# Patient Record
Sex: Female | Born: 1935 | Race: Black or African American | Hispanic: No | Smoking: Former smoker
Health system: Southern US, Community
[De-identification: ages and names within clinical notes are randomized; demographics above are authoritative.]

## PROBLEM LIST (undated history)

## (undated) DIAGNOSIS — E785 Hyperlipidemia, unspecified: Secondary | ICD-10-CM

## (undated) DIAGNOSIS — I517 Cardiomegaly: Secondary | ICD-10-CM

## (undated) DIAGNOSIS — Z972 Presence of dental prosthetic device (complete) (partial): Secondary | ICD-10-CM

## (undated) DIAGNOSIS — H409 Unspecified glaucoma: Secondary | ICD-10-CM

## (undated) DIAGNOSIS — M1990 Unspecified osteoarthritis, unspecified site: Secondary | ICD-10-CM

## (undated) DIAGNOSIS — N183 Chronic kidney disease, stage 3 unspecified: Secondary | ICD-10-CM

## (undated) DIAGNOSIS — I10 Essential (primary) hypertension: Secondary | ICD-10-CM

## (undated) DIAGNOSIS — I2510 Atherosclerotic heart disease of native coronary artery without angina pectoris: Secondary | ICD-10-CM

## (undated) HISTORY — PX: ABDOMINAL HYSTERECTOMY: SHX81

## (undated) HISTORY — PX: CORONARY ARTERY BYPASS GRAFT: SHX141

## (undated) HISTORY — PX: CATARACT EXTRACTION W/ INTRAOCULAR LENS  IMPLANT, BILATERAL: SHX1307

---

## 2005-11-09 ENCOUNTER — Ambulatory Visit: Payer: Self-pay | Admitting: Specialist

## 2006-06-08 ENCOUNTER — Ambulatory Visit: Payer: Self-pay | Admitting: Family Medicine

## 2011-10-26 ENCOUNTER — Ambulatory Visit: Payer: Self-pay | Admitting: Ophthalmology

## 2011-12-07 ENCOUNTER — Ambulatory Visit: Payer: Self-pay | Admitting: Ophthalmology

## 2013-03-21 ENCOUNTER — Ambulatory Visit: Payer: Self-pay | Admitting: Family Medicine

## 2015-01-09 DIAGNOSIS — I10 Essential (primary) hypertension: Secondary | ICD-10-CM | POA: Insufficient documentation

## 2015-01-27 DIAGNOSIS — N183 Chronic kidney disease, stage 3 unspecified: Secondary | ICD-10-CM | POA: Insufficient documentation

## 2015-01-27 DIAGNOSIS — I119 Hypertensive heart disease without heart failure: Secondary | ICD-10-CM | POA: Insufficient documentation

## 2017-03-30 DIAGNOSIS — I340 Nonrheumatic mitral (valve) insufficiency: Secondary | ICD-10-CM | POA: Insufficient documentation

## 2018-09-19 ENCOUNTER — Encounter: Payer: Self-pay | Admitting: *Deleted

## 2018-09-19 ENCOUNTER — Other Ambulatory Visit: Payer: Self-pay

## 2018-09-20 NOTE — Discharge Instructions (Signed)
General Anesthesia, Adult, Care After  This sheet gives you information about how to care for yourself after your procedure. Your health care provider may also give you more specific instructions. If you have problems or questions, contact your health care provider.  What can I expect after the procedure?  After the procedure, the following side effects are common:  Pain or discomfort at the IV site.  Nausea.  Vomiting.  Sore throat.  Trouble concentrating.  Feeling cold or chills.  Weak or tired.  Sleepiness and fatigue.  Soreness and body aches. These side effects can affect parts of the body that were not involved in surgery.  Follow these instructions at home:    For at least 24 hours after the procedure:  Have a responsible adult stay with you. It is important to have someone help care for you until you are awake and alert.  Rest as needed.  Do not:  Participate in activities in which you could fall or become injured.  Drive.  Use heavy machinery.  Drink alcohol.  Take sleeping pills or medicines that cause drowsiness.  Make important decisions or sign legal documents.  Take care of children on your own.  Eating and drinking  Follow any instructions from your health care provider about eating or drinking restrictions.  When you feel hungry, start by eating small amounts of foods that are soft and easy to digest (bland), such as toast. Gradually return to your regular diet.  Drink enough fluid to keep your urine pale yellow.  If you vomit, rehydrate by drinking water, juice, or clear broth.  General instructions  If you have sleep apnea, surgery and certain medicines can increase your risk for breathing problems. Follow instructions from your health care provider about wearing your sleep device:  Anytime you are sleeping, including during daytime naps.  While taking prescription pain medicines, sleeping medicines, or medicines that make you drowsy.  Return to your normal activities as told by your health care  provider. Ask your health care provider what activities are safe for you.  Take over-the-counter and prescription medicines only as told by your health care provider.  If you smoke, do not smoke without supervision.  Keep all follow-up visits as told by your health care provider. This is important.  Contact a health care provider if:  You have nausea or vomiting that does not get better with medicine.  You cannot eat or drink without vomiting.  You have pain that does not get better with medicine.  You are unable to pass urine.  You develop a skin rash.  You have a fever.  You have redness around your IV site that gets worse.  Get help right away if:  You have difficulty breathing.  You have chest pain.  You have blood in your urine or stool, or you vomit blood.  Summary  After the procedure, it is common to have a sore throat or nausea. It is also common to feel tired.  Have a responsible adult stay with you for the first 24 hours after general anesthesia. It is important to have someone help care for you until you are awake and alert.  When you feel hungry, start by eating small amounts of foods that are soft and easy to digest (bland), such as toast. Gradually return to your regular diet.  Drink enough fluid to keep your urine pale yellow.  Return to your normal activities as told by your health care provider. Ask your health care   provider what activities are safe for you.  This information is not intended to replace advice given to you by your health care provider. Make sure you discuss any questions you have with your health care provider.  Document Released: 11/14/2000 Document Revised: 03/24/2017 Document Reviewed: 03/24/2017  Elsevier Interactive Patient Education  2019 [employer]

## 2018-09-26 ENCOUNTER — Ambulatory Visit
Admission: RE | Admit: 2018-09-26 | Discharge: 2018-09-26 | Disposition: A | Discharge status: Home / Self Care | Payer: Medicare Other | Attending: Ophthalmology | Admitting: Ophthalmology

## 2018-09-26 ENCOUNTER — Encounter
Admission: RE | Disposition: A | Discharge status: Home / Self Care | Payer: Self-pay | Source: Home / Self Care | Attending: Ophthalmology

## 2018-09-26 ENCOUNTER — Ambulatory Visit: Payer: Medicare Other | Admitting: Anesthesiology

## 2018-09-26 DIAGNOSIS — E7800 Pure hypercholesterolemia, unspecified: Secondary | ICD-10-CM | POA: Insufficient documentation

## 2018-09-26 DIAGNOSIS — D649 Anemia, unspecified: Secondary | ICD-10-CM | POA: Insufficient documentation

## 2018-09-26 DIAGNOSIS — R001 Bradycardia, unspecified: Secondary | ICD-10-CM | POA: Diagnosis not present

## 2018-09-26 DIAGNOSIS — I10 Essential (primary) hypertension: Secondary | ICD-10-CM | POA: Diagnosis not present

## 2018-09-26 DIAGNOSIS — Z951 Presence of aortocoronary bypass graft: Secondary | ICD-10-CM | POA: Diagnosis not present

## 2018-09-26 DIAGNOSIS — H401113 Primary open-angle glaucoma, right eye, severe stage: Secondary | ICD-10-CM | POA: Diagnosis present

## 2018-09-26 DIAGNOSIS — I252 Old myocardial infarction: Secondary | ICD-10-CM | POA: Diagnosis not present

## 2018-09-26 DIAGNOSIS — Z9842 Cataract extraction status, left eye: Secondary | ICD-10-CM | POA: Insufficient documentation

## 2018-09-26 DIAGNOSIS — F17200 Nicotine dependence, unspecified, uncomplicated: Secondary | ICD-10-CM | POA: Diagnosis not present

## 2018-09-26 DIAGNOSIS — Z9841 Cataract extraction status, right eye: Secondary | ICD-10-CM | POA: Insufficient documentation

## 2018-09-26 DIAGNOSIS — M1990 Unspecified osteoarthritis, unspecified site: Secondary | ICD-10-CM | POA: Insufficient documentation

## 2018-09-26 DIAGNOSIS — I2510 Atherosclerotic heart disease of native coronary artery without angina pectoris: Secondary | ICD-10-CM | POA: Diagnosis not present

## 2018-09-26 DIAGNOSIS — F419 Anxiety disorder, unspecified: Secondary | ICD-10-CM | POA: Insufficient documentation

## 2018-09-26 DIAGNOSIS — M109 Gout, unspecified: Secondary | ICD-10-CM | POA: Insufficient documentation

## 2018-09-26 HISTORY — DX: Cardiomegaly: I51.7

## 2018-09-26 HISTORY — DX: Essential (primary) hypertension: I10

## 2018-09-26 HISTORY — DX: Presence of dental prosthetic device (complete) (partial): Z97.2

## 2018-09-26 HISTORY — DX: Chronic kidney disease, stage 3 (moderate): N18.3

## 2018-09-26 HISTORY — DX: Chronic kidney disease, stage 3 unspecified: N18.30

## 2018-09-26 HISTORY — DX: Atherosclerotic heart disease of native coronary artery without angina pectoris: I25.10

## 2018-09-26 HISTORY — DX: Unspecified osteoarthritis, unspecified site: M19.90

## 2018-09-26 HISTORY — DX: Unspecified glaucoma: H40.9

## 2018-09-26 HISTORY — DX: Hyperlipidemia, unspecified: E78.5

## 2018-09-26 HISTORY — PX: PHOTOCOAGULATION WITH LASER: SHX6027

## 2018-09-26 SURGERY — PHOTOCOAGULATION, EYE, USING LASER
Anesthesia: Monitor Anesthesia Care | Site: Eye | Laterality: Right

## 2018-09-26 MED ORDER — LIDOCAINE HCL 2 % IJ SOLN
INTRAMUSCULAR | Status: DC | PRN
  Administered 2018-09-26: 3 mL via OPHTHALMIC

## 2018-09-26 MED ORDER — ATROPINE SULFATE 1 % OP OINT
TOPICAL_OINTMENT | OPHTHALMIC | Status: DC | PRN
  Administered 2018-09-26: 1 via OPHTHALMIC

## 2018-09-26 MED ORDER — NEOMYCIN-POLYMYXIN-DEXAMETH 3.5-10000-0.1 OP OINT
TOPICAL_OINTMENT | OPHTHALMIC | Status: DC | PRN
  Administered 2018-09-26: 1 via OPHTHALMIC

## 2018-09-26 MED ORDER — ALFENTANIL 500 MCG/ML IJ INJ
INJECTION | INTRAVENOUS | Status: DC | PRN
  Administered 2018-09-26: 200 ug via INTRAVENOUS
  Administered 2018-09-26: 100 ug via INTRAVENOUS

## 2018-09-26 SURGICAL SUPPLY — 11 items
DEVICE G-PROBE SGL USE (Laser) IMPLANT
DEVICE MICRO PULS P3 SGL USE (Laser) ×1 IMPLANT
G-PROBE SGL USE (Laser) ×3
GAUZE SPONGE 4X4 12PLY STRL (GAUZE/BANDAGES/DRESSINGS) ×3 IMPLANT
NDL FILTER BLUNT 18X1 1/2 (NEEDLE) ×1 IMPLANT
NDL RETROBULBAR .5 NSTRL (NEEDLE) ×3 IMPLANT
NEEDLE FILTER BLUNT 18X 1/2SAF (NEEDLE) ×2
NEEDLE FILTER BLUNT 18X1 1/2 (NEEDLE) ×1 IMPLANT
SYR 5ML LL (SYRINGE) ×3 IMPLANT
WATER STERILE IRR 250ML POUR (IV SOLUTION) ×3 IMPLANT
WATER STERILE IRR 500ML POUR (IV SOLUTION) IMPLANT

## 2018-09-26 NOTE — Transfer of Care (Signed)
Immediate Anesthesia Transfer of Care Note  Patient: Hayley Maynard  Procedure(s) Performed: PHOTOCOAGULATION WITH LASER (Right Eye)  Patient Location: PACU  Anesthesia Type: MAC  Level of Consciousness: awake, alert  and patient cooperative  Airway and Oxygen Therapy: Patient Spontanous Breathing and Patient connected to supplemental oxygen  Post-op Assessment: Post-op Vital signs reviewed, Patient's Cardiovascular Status Stable, Respiratory Function Stable, Patent Airway and No signs of Nausea or vomiting  Post-op Vital Signs: Reviewed and stable  Complications: No apparent anesthesia complications

## 2018-09-26 NOTE — Anesthesia Procedure Notes (Signed)
Procedure Name: MAC Performed by: Enrica Corliss, CRNA Pre-anesthesia Checklist: Patient identified, Emergency Drugs available, Suction available, Timeout performed and Patient being monitored Patient Re-evaluated:Patient Re-evaluated prior to induction Oxygen Delivery Method: Nasal cannula Placement Confirmation: positive ETCO2       

## 2018-09-26 NOTE — H&P (Signed)
The History and Physical notes are on paper, have been signed, and are to be scanned. The patient remains stable and unchanged from the H&P.   Previous H&P reviewed, patient examined, and there are no changes.  Hayley Maynard 09/26/2018 12:26 PM

## 2018-09-26 NOTE — Anesthesia Preprocedure Evaluation (Signed)
Anesthesia Evaluation   Patient awake    Reviewed: Allergy & Precautions, H&P , NPO status , Patient's Chart, lab work & pertinent test results  History of Anesthesia Complications Negative for: history of anesthetic complications  Airway Mallampati: II  TM Distance: >3 FB Neck ROM: full  Mouth opening: Limited Mouth Opening  Dental  (+) Edentulous Upper, Edentulous Lower   Pulmonary Current Smoker,    Pulmonary exam normal breath sounds clear to auscultation       Cardiovascular hypertension, + CAD  Normal cardiovascular exam Rhythm:regular Rate:Normal     Neuro/Psych negative neurological ROS     GI/Hepatic negative GI ROS, Neg liver ROS,   Endo/Other  negative endocrine ROS  Renal/GU   negative genitourinary   Musculoskeletal   Abdominal   Peds  Hematology negative hematology ROS (+)   Anesthesia Other Findings   Reproductive/Obstetrics                             Anesthesia Physical Anesthesia Plan  ASA: III  Anesthesia Plan: MAC   Post-op Pain Management:    Induction:   PONV Risk Score and Plan:   Airway Management Planned:   Additional Equipment:   Intra-op Plan:   Post-operative Plan:   Informed Consent: I have reviewed the patients History and Physical, chart, labs and discussed the procedure including the risks, benefits and alternatives for the proposed anesthesia with the patient or authorized representative who has indicated his/her understanding and acceptance.       Plan Discussed with:   Anesthesia Plan Comments:         Anesthesia Quick Evaluation

## 2018-09-26 NOTE — Anesthesia Postprocedure Evaluation (Signed)
Anesthesia Post Note  Patient: Hayley Maynard  Procedure(s) Performed: PHOTOCOAGULATION WITH LASER (Right Eye)  Patient location during evaluation: PACU Anesthesia Type: MAC Level of consciousness: awake and alert Pain management: pain level controlled Respiratory status: spontaneous breathing Cardiovascular status: stable Anesthetic complications: no    Kishon Garriga, III,  Mayre Bury D

## 2018-09-26 NOTE — Op Note (Signed)
DATE OF SURGERY: 09/26/2018  PREOPERATIVE DIAGNOSES: Severe stage primary open angle glaucoma, right eye.      H40.1113  POSTOPERATIVE DIAGNOSES: Same  PROCEDURES PERFORMED: Transscleral diode cyclophotocoagulation, right eye  SURGEON: Chad Arvilla Salada, M.D.  ANESTHESIA: Retrobulbar block of Xylocaine and Bupivacaine and Hyaluronidase  COMPLICATIONS: None.  INDICATIONS FOR PROCEDURE: Hayley Maynard is a 85 y.o. year-old female with uncontrolled primary open angle glaucoma. The risks and benefits of glaucoma surgery were discussed with the patient, and she consented for a diode laser surgery.  PROCEDURE IN DETAIL: The eye for surgery was verified during the time-out procedure in the operating room. A retrobulbar block of lidocaine, Marcaine, and hyaluronidase was done for an[place]0.1015Melene MuKalispell RegionGGerlene Gerlene Burdo21cMozMountain Valley Regional KentuckGerlene Burdo41cMozAstrGerlene Burdo2cMKentucky0.105GerleneGerlene Burdo42cMozGailey Eye SurgeGerlene Burdo34cMKenGerlene Burdo87cMGerlene Burdo11KentuGerlene BurGerlene Burdo72cMozMid-HudsonGerlene Burdo33cMozNew HorizonsGerlene Burdo98cMozSurgGerlene Burdo65cMozUmass MemoKentuckGerlene Burdo39cMoz38EMozambTeacher, e<MEASUREGerlene Burdo16cMozOscar G. JohKGerlene BuKentuckyGerlene Burdo36cMozFeliciana-Amg Specialty Hospi46tMarjory LiTeacherKentucky, [id number] years/preurgicare [facility] [id number] > The eye was pressure patched closed. The patient tolerated the procedure well and was transferred to the Post-operative Care Unit in stable condition.

## 2018-11-08 DIAGNOSIS — D649 Anemia, unspecified: Secondary | ICD-10-CM | POA: Insufficient documentation

## 2018-11-08 DIAGNOSIS — I2510 Atherosclerotic heart disease of native coronary artery without angina pectoris: Secondary | ICD-10-CM | POA: Insufficient documentation

## 2018-11-08 DIAGNOSIS — E782 Mixed hyperlipidemia: Secondary | ICD-10-CM | POA: Insufficient documentation

## 2018-11-09 ENCOUNTER — Encounter: Payer: Self-pay | Admitting: Podiatry

## 2018-11-09 ENCOUNTER — Ambulatory Visit (INDEPENDENT_AMBULATORY_CARE_PROVIDER_SITE_OTHER): Payer: Medicare Other | Admitting: Podiatry

## 2018-11-09 DIAGNOSIS — M79676 Pain in unspecified toe(s): Secondary | ICD-10-CM

## 2018-11-09 DIAGNOSIS — B351 Tinea unguium: Secondary | ICD-10-CM | POA: Diagnosis not present

## 2018-11-13 NOTE — Progress Notes (Signed)
   SUBJECTIVE Patient presents to office today complaining of elongated, thickened nails that cause pain while ambulating in shoes. She is unable to trim her own nails. Patient is here for further evaluation and treatment.  Past Medical History:  Diagnosis Date  . Arthritis    feet  . CKD (chronic kidney disease), stage III (HCC)   . Coronary artery disease   . Glaucoma   . Hyperlipidemia   . Hypertension   . LVH (left ventricular hypertrophy)   . Wears dentures    full upper and lower    OBJECTIVE General Patient is awake, alert, and oriented x 3 and in no acute distress. Derm Skin is dry and supple bilateral. Negative open lesions or macerations. Remaining integument unremarkable. Nails are tender, long, thickened and dystrophic with subungual debris, consistent with onychomycosis, 1-5 bilateral. No signs of infection noted. Vasc  DP and PT pedal pulses palpable bilaterally. Temperature gradient within normal limits.  Neuro Epicritic and protective threshold sensation grossly intact bilaterally.  Musculoskeletal Exam No symptomatic pedal deformities noted bilateral. Muscular strength within normal limits.  ASSESSMENT 1. Onychodystrophic nails 1-5 bilateral with hyperkeratosis of nails.  2. Onychomycosis of nail due to dermatophyte bilateral 3. Pain in foot bilateral  PLAN OF CARE 1. Patient evaluated today.  2. Instructed to maintain good pedal hygiene and foot care.  3. Mechanical debridement of nails 1-5 bilaterally performed using a nail nipper. Filed with dremel without incident.  4. Return to clinic in 3 mos.    Felecia Shelling, DPM [facility]  Dr. Felecia Shelling, DPM    [address]                                        [place], [place] [address]                Office [phone]  Fax [phone]

## 2019-02-12 ENCOUNTER — Encounter: Payer: Self-pay | Admitting: Podiatry

## 2019-02-12 ENCOUNTER — Other Ambulatory Visit: Payer: Self-pay

## 2019-02-12 ENCOUNTER — Ambulatory Visit (INDEPENDENT_AMBULATORY_CARE_PROVIDER_SITE_OTHER): Payer: Medicare Other | Admitting: Podiatry

## 2019-02-12 DIAGNOSIS — L989 Disorder of the skin and subcutaneous tissue, unspecified: Secondary | ICD-10-CM

## 2019-02-12 DIAGNOSIS — B351 Tinea unguium: Secondary | ICD-10-CM | POA: Diagnosis not present

## 2019-02-12 DIAGNOSIS — M79676 Pain in unspecified toe(s): Secondary | ICD-10-CM

## 2019-02-14 NOTE — Progress Notes (Signed)
    Subjective: Patient is a 85 y.o. female presenting to the office today with a chief complaint of painful callus lesions noted to the bilateral feet that have become increasingly more painful in the past few weeks. Bearing weight and ambulation increases the pain. She has not done anything at home for treatment.  Patient also complains of elongated, thickened nails that cause pain while ambulating in shoes. She is unable to trim her own nails. Patient presents today for further treatment and evaluation.  Past Medical History:  Diagnosis Date  . Arthritis    feet  . CKD (chronic kidney disease), stage III ([place])   . Coronary artery disease   . Glaucoma   . Hyperlipidemia   . Hypertension   . LVH (left ventricular hypertrophy)   . Wears dentures    full upper and lower    Objective:  Physical Exam General: Alert and oriented x3 in no acute distress  Dermatology: Hyperkeratotic lesions present on the bilateral feet. Pain on palpation with a central nucleated core noted. Skin is warm, dry and supple bilateral lower extremities. Negative for open lesions or macerations. Nails are tender, long, thickened and dystrophic with subungual debris, consistent with onychomycosis, 1-5 bilateral. No signs of infection noted.  Vascular: Palpable pedal pulses bilaterally. No edema or erythema noted. Capillary refill within normal limits.  Neurological: Epicritic and protective threshold grossly intact bilaterally.   Musculoskeletal Exam: Pain on palpation at the keratotic lesion noted. Range of motion within normal limits bilateral. Muscle strength 5/5 in all groups bilateral.  Assessment: 1. Onychodystrophic nails 1-5 bilateral with hyperkeratosis of nails.  2. Onychomycosis of nail due to dermatophyte bilateral 3. Pre-ulcerative callus lesions noted to the bilateral feet x 2   Plan of Care:  1. Patient evaluated. 2. Excisional debridement of keratoic lesion using a chisel blade was  performed without incident.  3. Dressed with light dressing. 4. Mechanical debridement of nails 1-5 bilaterally performed using a nail nipper. Filed with dremel without incident.  5. Patient is to return to the clinic in 3 months.   Edrick Kins, DPM [facility]  Dr. Edrick Kins, [address]                                        [place], [place] [address]                Office [phone]  Fax [phone]

## 2019-05-13 ENCOUNTER — Encounter: Payer: Self-pay | Admitting: Podiatry

## 2019-05-13 ENCOUNTER — Other Ambulatory Visit: Payer: Self-pay

## 2019-05-13 ENCOUNTER — Ambulatory Visit (INDEPENDENT_AMBULATORY_CARE_PROVIDER_SITE_OTHER): Payer: Medicare Other | Admitting: Podiatry

## 2019-05-13 DIAGNOSIS — M79676 Pain in unspecified toe(s): Secondary | ICD-10-CM | POA: Diagnosis not present

## 2019-05-13 DIAGNOSIS — B351 Tinea unguium: Secondary | ICD-10-CM

## 2019-05-13 NOTE — Progress Notes (Signed)
Complaint:  Visit Type: Patient returns to my office for continued preventative foot care services. Complaint: Patient states" my nails have grown long and thick and become painful to walk and wear shoes"  The patient presents for preventative foot care services. No changes to ROS  Podiatric Exam: Vascular: dorsalis pedis and posterior tibial pulses are palpable bilateral. Capillary return is immediate. Cold feet.. Skin turgor WNL  Sensorium: Normal Semmes Weinstein monofilament test. Normal tactile sensation bilaterally. Nail Exam: Pt has thick disfigured discolored nails with subungual debris noted bilateral entire nail hallux through fifth toenails Ulcer Exam: There is no evidence of ulcer or pre-ulcerative changes or infection. Orthopedic Exam: Muscle tone and strength are WNL. No limitations in general ROM. No crepitus or effusions noted. Foot type and digits show no abnormalities. HAV  B/L.  Contracted digits  B/L. Skin: No Porokeratosis. No infection or ulcers  Diagnosis:  Onychomycosis, , Pain in right toe, pain in left toes  Treatment & Plan Procedures and Treatment: Consent by patient was obtained for treatment procedures.   Debridement of mycotic and hypertrophic toenails, 1 through 5 bilateral and clearing of subungual debris. No ulceration, no infection noted.  Return Visit-Office Procedure: Patient instructed to return to the office for a follow up visit 3 months for continued evaluation and treatment.    Gardiner Barefoot DPM

## 2019-07-25 ENCOUNTER — Other Ambulatory Visit: Payer: Self-pay | Admitting: Family Medicine

## 2019-07-25 DIAGNOSIS — R0989 Other specified symptoms and signs involving the circulatory and respiratory systems: Secondary | ICD-10-CM

## 2019-08-02 ENCOUNTER — Other Ambulatory Visit: Payer: Self-pay

## 2019-08-02 ENCOUNTER — Ambulatory Visit
Admission: RE | Admit: 2019-08-02 | Discharge: 2019-08-02 | Disposition: A | Discharge status: Home / Self Care | Payer: Medicare Other | Source: Ambulatory Visit | Attending: Family Medicine | Admitting: Family Medicine

## 2019-08-02 DIAGNOSIS — R0989 Other specified symptoms and signs involving the circulatory and respiratory systems: Secondary | ICD-10-CM | POA: Diagnosis present

## 2019-08-08 ENCOUNTER — Other Ambulatory Visit: Payer: Self-pay

## 2019-08-08 ENCOUNTER — Encounter: Payer: Self-pay | Admitting: Podiatry

## 2019-08-08 ENCOUNTER — Ambulatory Visit (INDEPENDENT_AMBULATORY_CARE_PROVIDER_SITE_OTHER): Payer: Medicare Other | Admitting: Podiatry

## 2019-08-08 DIAGNOSIS — M79676 Pain in unspecified toe(s): Secondary | ICD-10-CM | POA: Diagnosis not present

## 2019-08-08 DIAGNOSIS — B351 Tinea unguium: Secondary | ICD-10-CM | POA: Diagnosis not present

## 2019-08-08 NOTE — Progress Notes (Signed)
Complaint:  Visit Type: Patient returns to my office for continued preventative foot care services. Complaint: Patient states" my nails have grown long and thick and become painful to walk and wear shoes"  The patient presents for preventative foot care services. No changes to ROS  Podiatric Exam: Vascular: dorsalis pedis and posterior tibial pulses are palpable bilateral. Capillary return is immediate. Cold feet.. Skin turgor WNL  Sensorium: Normal Semmes Weinstein monofilament test. Normal tactile sensation bilaterally. Nail Exam: Pt has thick disfigured discolored nails with subungual debris noted bilateral entire nail hallux through fifth toenails Ulcer Exam: There is no evidence of ulcer or pre-ulcerative changes or infection. Orthopedic Exam: Muscle tone and strength are WNL. No limitations in general ROM. No crepitus or effusions noted. Foot type and digits show no abnormalities. HAV  B/L.  Contracted digits  B/L. Skin: No Porokeratosis. No infection or ulcers  Diagnosis:  Onychomycosis, , Pain in right toe, pain in left toes  Treatment & Plan Procedures and Treatment: Consent by patient was obtained for treatment procedures.   Debridement of mycotic and hypertrophic toenails, 1 through 5 bilateral and clearing of subungual debris. No ulceration, no infection noted. Excoriation third toe left.  No bleeding. Return Visit-Office Procedure: Patient instructed to return to the office for a follow up visit 3 months for continued evaluation and treatment.    Gardiner Barefoot DPM

## 2019-10-28 ENCOUNTER — Other Ambulatory Visit: Payer: Self-pay

## 2019-10-28 ENCOUNTER — Ambulatory Visit (INDEPENDENT_AMBULATORY_CARE_PROVIDER_SITE_OTHER): Payer: Medicare Other | Admitting: Podiatry

## 2019-10-28 ENCOUNTER — Encounter: Payer: Self-pay | Admitting: Podiatry

## 2019-10-28 DIAGNOSIS — B351 Tinea unguium: Secondary | ICD-10-CM | POA: Diagnosis not present

## 2019-10-28 DIAGNOSIS — M79676 Pain in unspecified toe(s): Secondary | ICD-10-CM

## 2019-10-28 DIAGNOSIS — N183 Chronic kidney disease, stage 3 unspecified: Secondary | ICD-10-CM | POA: Insufficient documentation

## 2019-10-28 NOTE — Progress Notes (Signed)
Complaint:  Visit Type: Patient returns to my office for at risk foot care.  This patient requires this care by a professional since this patient will be at risk due to having chronic kidney disease stage III .  This patient is unable to cut her own toenails since she cannot reach her nails.  This patient presents for risk foot care today.   Podiatric Exam: Vascular: dorsalis pedis and posterior tibial pulses are palpable right foot.  Dorsalis and posterior tibial pulses are not palpable  B/L. Capillary return is immediate. Cold feet.. Thin shiny peeling skin  B/L Sensorium: Normal Semmes Weinstein monofilament test. Normal tactile sensation bilaterally. Nail Exam: Pt has thick disfigured discolored nails with subungual debris noted bilateral entire nail hallux through fifth toenails Ulcer Exam: There is no evidence of ulcer or pre-ulcerative changes or infection. Orthopedic Exam: Muscle tone and strength are WNL. No limitations in general ROM. No crepitus or effusions noted. Foot type and digits show no abnormalities. HAV  B/L.  Contracted digits  B/L. Skin: No Porokeratosis. No infection or ulcers  Diagnosis:  Onychomycosis, , Pain in right toe, pain in left toes  Treatment & Plan Procedures and Treatment: Consent by patient was obtained for treatment procedures.   Debridement of mycotic and hypertrophic toenails, 1 through 5 bilateral and clearing of subungual debris. No ulceration, no infection noted.  Return Visit-Office Procedure: Patient instructed to return to the office for a follow up visit 3 months for continued evaluation and treatment.  Told this patient to return for periodic foot evaluation due to potential at risk complications.      Helane Gunther DPM

## 2019-11-04 ENCOUNTER — Ambulatory Visit: Payer: Medicare Other | Admitting: Podiatry

## 2020-01-30 ENCOUNTER — Other Ambulatory Visit: Payer: Self-pay

## 2020-01-30 ENCOUNTER — Encounter: Payer: Self-pay | Admitting: Podiatry

## 2020-01-30 ENCOUNTER — Ambulatory Visit (INDEPENDENT_AMBULATORY_CARE_PROVIDER_SITE_OTHER): Payer: Medicare Other | Admitting: Podiatry

## 2020-01-30 DIAGNOSIS — B351 Tinea unguium: Secondary | ICD-10-CM

## 2020-01-30 DIAGNOSIS — M79676 Pain in unspecified toe(s): Secondary | ICD-10-CM

## 2020-01-30 DIAGNOSIS — N183 Chronic kidney disease, stage 3 unspecified: Secondary | ICD-10-CM

## 2020-01-30 NOTE — Progress Notes (Signed)
This patient returns to my office for at risk foot care.  This patient requires this care by a professional since this patient will be at risk due to having chronic kidney disease.  This patient is unable to cut nails herself since the patient cannot reach her nails.These nails are painful walking and wearing shoes.  This patient presents for at risk foot care today.  General Appearance  Alert, conversant and in no acute stress.  Vascular  Dorsalis pedis and posterior tibial  pulses are weakly palpable  Right.  Dorsalis pedis and posterior tibial pulses absent left foot.  Capillary return is within normal limits  bilaterally. Cpld feet. bilaterally.  Neurologic  Senn-Weinstein monofilament wire test within normal limits  bilaterally. Muscle power within normal limits bilaterally.  Nails Thick disfigured discolored nails with subungual debris  from hallux to fifth toes bilaterally. No evidence of bacterial infection or drainage bilaterally.  Orthopedic  No limitations of motion  feet .  No crepitus or effusions noted.  HAV  B/L.  Contracted digits  B/L.  Skin  normotropic skin with no porokeratosis noted bilaterally.  No signs of infections or ulcers noted.     Onychomycosis  Pain in right toes  Pain in left toes  Consent was obtained for treatment procedures.   Mechanical debridement of nails 1-5  bilaterally performed with a nail nipper.  Filed with dremel without incident.    Return office visit   3 months                   Told patient to return for periodic foot care and evaluation due to potential at risk complications.   Helane Gunther DPM

## 2020-02-19 DIAGNOSIS — I6523 Occlusion and stenosis of bilateral carotid arteries: Secondary | ICD-10-CM | POA: Insufficient documentation

## 2020-03-19 ENCOUNTER — Emergency Department: Payer: Medicare Other

## 2020-03-19 ENCOUNTER — Encounter: Payer: Self-pay | Admitting: *Deleted

## 2020-03-19 ENCOUNTER — Other Ambulatory Visit: Payer: Self-pay

## 2020-03-19 DIAGNOSIS — Z515 Encounter for palliative care: Secondary | ICD-10-CM | POA: Diagnosis not present

## 2020-03-19 DIAGNOSIS — F17210 Nicotine dependence, cigarettes, uncomplicated: Secondary | ICD-10-CM | POA: Diagnosis present

## 2020-03-19 DIAGNOSIS — J189 Pneumonia, unspecified organism: Secondary | ICD-10-CM | POA: Diagnosis present

## 2020-03-19 DIAGNOSIS — I2510 Atherosclerotic heart disease of native coronary artery without angina pectoris: Secondary | ICD-10-CM | POA: Diagnosis present

## 2020-03-19 DIAGNOSIS — E785 Hyperlipidemia, unspecified: Secondary | ICD-10-CM | POA: Diagnosis present

## 2020-03-19 DIAGNOSIS — Z79899 Other long term (current) drug therapy: Secondary | ICD-10-CM

## 2020-03-19 DIAGNOSIS — R778 Other specified abnormalities of plasma proteins: Secondary | ICD-10-CM | POA: Diagnosis present

## 2020-03-19 DIAGNOSIS — I129 Hypertensive chronic kidney disease with stage 1 through stage 4 chronic kidney disease, or unspecified chronic kidney disease: Secondary | ICD-10-CM | POA: Diagnosis present

## 2020-03-19 DIAGNOSIS — Z888 Allergy status to other drugs, medicaments and biological substances status: Secondary | ICD-10-CM

## 2020-03-19 DIAGNOSIS — Z20822 Contact with and (suspected) exposure to covid-19: Secondary | ICD-10-CM | POA: Diagnosis present

## 2020-03-19 DIAGNOSIS — Z90710 Acquired absence of both cervix and uterus: Secondary | ICD-10-CM

## 2020-03-19 DIAGNOSIS — R296 Repeated falls: Secondary | ICD-10-CM | POA: Diagnosis present

## 2020-03-19 DIAGNOSIS — Y92009 Unspecified place in unspecified non-institutional (private) residence as the place of occurrence of the external cause: Secondary | ICD-10-CM

## 2020-03-19 DIAGNOSIS — I493 Ventricular premature depolarization: Secondary | ICD-10-CM | POA: Diagnosis present

## 2020-03-19 DIAGNOSIS — Z951 Presence of aortocoronary bypass graft: Secondary | ICD-10-CM

## 2020-03-19 DIAGNOSIS — S72114A Nondisplaced fracture of greater trochanter of right femur, initial encounter for closed fracture: Secondary | ICD-10-CM | POA: Diagnosis not present

## 2020-03-19 DIAGNOSIS — N1832 Chronic kidney disease, stage 3b: Secondary | ICD-10-CM | POA: Diagnosis present

## 2020-03-19 DIAGNOSIS — S72111A Displaced fracture of greater trochanter of right femur, initial encounter for closed fracture: Secondary | ICD-10-CM | POA: Diagnosis not present

## 2020-03-19 DIAGNOSIS — H409 Unspecified glaucoma: Secondary | ICD-10-CM | POA: Diagnosis present

## 2020-03-19 DIAGNOSIS — W1839XA Other fall on same level, initial encounter: Secondary | ICD-10-CM | POA: Diagnosis present

## 2020-03-19 LAB — CBC
HCT: 34.8 % — ABNORMAL LOW (ref 36.0–46.0)
Hemoglobin: 11.5 g/dL — ABNORMAL LOW (ref 12.0–15.0)
MCH: 31.1 pg (ref 26.0–34.0)
MCHC: 33 g/dL (ref 30.0–36.0)
MCV: 94.1 fL (ref 80.0–100.0)
Platelets: 206 10*3/uL (ref 150–400)
RBC: 3.7 MIL/uL — ABNORMAL LOW (ref 3.87–5.11)
RDW: 13.7 % (ref 11.5–15.5)
WBC: 12.4 10*3/uL — ABNORMAL HIGH (ref 4.0–10.5)
nRBC: 0 % (ref 0.0–0.2)

## 2020-03-19 LAB — BASIC METABOLIC PANEL
Anion gap: 17 — ABNORMAL HIGH (ref 5–15)
BUN: 32 mg/dL — ABNORMAL HIGH (ref 8–23)
CO2: 23 mmol/L (ref 22–32)
Calcium: 9.1 mg/dL (ref 8.9–10.3)
Chloride: 101 mmol/L (ref 98–111)
Creatinine, Ser: 1.65 mg/dL — ABNORMAL HIGH (ref 0.44–1.00)
GFR calc Af Amer: 30 mL/min — ABNORMAL LOW (ref >60)
GFR calc non Af Amer: 26 mL/min — ABNORMAL LOW (ref >60)
Glucose, Bld: 107 mg/dL — ABNORMAL HIGH (ref 70–99)
Potassium: 4.2 mmol/L (ref 3.5–5.1)
Sodium: 141 mmol/L (ref 135–145)

## 2020-03-19 LAB — TROPONIN I (HIGH SENSITIVITY): Troponin I (High Sensitivity): 26 ng/L — ABNORMAL HIGH (ref <18)

## 2020-03-19 NOTE — ED Triage Notes (Addendum)
Pt to ED from home after two falls today. Pt uses a walker to ambulate at baseline and family reports normally does not fall. Today pt has been timid taking steps and guarding her right hip. When asked pt reports having pain in right hip. No other pain upon assessment. No head injury or LOC today.   Upon trying to transfer pt to wheelchair pt was unable to bear weight and needed a two person assist to pivot to the new wheelchair. This is a significant change per family who states pt usually is able to ambulate with only her walker.

## 2020-03-20 ENCOUNTER — Emergency Department: Payer: Medicare Other

## 2020-03-20 ENCOUNTER — Inpatient Hospital Stay: Payer: Medicare Other | Admitting: Anesthesiology

## 2020-03-20 ENCOUNTER — Inpatient Hospital Stay: Payer: Medicare Other

## 2020-03-20 ENCOUNTER — Encounter
Admission: EM | Disposition: A | Discharge status: Skilled Nursing Facility | Payer: Self-pay | Source: Home / Self Care | Attending: Internal Medicine

## 2020-03-20 ENCOUNTER — Encounter: Payer: Self-pay | Admitting: Family Medicine

## 2020-03-20 ENCOUNTER — Inpatient Hospital Stay
Admission: EM | Admit: 2020-03-20 | Discharge: 2020-03-24 | DRG: 480 | Disposition: A | Discharge status: Skilled Nursing Facility | Payer: Medicare Other | Attending: Internal Medicine | Admitting: Internal Medicine

## 2020-03-20 DIAGNOSIS — Z515 Encounter for palliative care: Secondary | ICD-10-CM | POA: Diagnosis not present

## 2020-03-20 DIAGNOSIS — Z951 Presence of aortocoronary bypass graft: Secondary | ICD-10-CM | POA: Diagnosis not present

## 2020-03-20 DIAGNOSIS — I2510 Atherosclerotic heart disease of native coronary artery without angina pectoris: Secondary | ICD-10-CM | POA: Diagnosis present

## 2020-03-20 DIAGNOSIS — Y92009 Unspecified place in unspecified non-institutional (private) residence as the place of occurrence of the external cause: Secondary | ICD-10-CM | POA: Diagnosis not present

## 2020-03-20 DIAGNOSIS — I10 Essential (primary) hypertension: Secondary | ICD-10-CM | POA: Diagnosis present

## 2020-03-20 DIAGNOSIS — N1832 Chronic kidney disease, stage 3b: Secondary | ICD-10-CM

## 2020-03-20 DIAGNOSIS — Z90710 Acquired absence of both cervix and uterus: Secondary | ICD-10-CM | POA: Diagnosis not present

## 2020-03-20 DIAGNOSIS — Z888 Allergy status to other drugs, medicaments and biological substances status: Secondary | ICD-10-CM | POA: Diagnosis not present

## 2020-03-20 DIAGNOSIS — E785 Hyperlipidemia, unspecified: Secondary | ICD-10-CM | POA: Diagnosis present

## 2020-03-20 DIAGNOSIS — J189 Pneumonia, unspecified organism: Secondary | ICD-10-CM | POA: Diagnosis present

## 2020-03-20 DIAGNOSIS — F17210 Nicotine dependence, cigarettes, uncomplicated: Secondary | ICD-10-CM | POA: Diagnosis present

## 2020-03-20 DIAGNOSIS — R778 Other specified abnormalities of plasma proteins: Secondary | ICD-10-CM | POA: Diagnosis present

## 2020-03-20 DIAGNOSIS — W1839XA Other fall on same level, initial encounter: Secondary | ICD-10-CM | POA: Diagnosis present

## 2020-03-20 DIAGNOSIS — Z419 Encounter for procedure for purposes other than remedying health state, unspecified: Secondary | ICD-10-CM

## 2020-03-20 DIAGNOSIS — S72111A Displaced fracture of greater trochanter of right femur, initial encounter for closed fracture: Secondary | ICD-10-CM

## 2020-03-20 DIAGNOSIS — I493 Ventricular premature depolarization: Secondary | ICD-10-CM | POA: Diagnosis present

## 2020-03-20 DIAGNOSIS — N183 Chronic kidney disease, stage 3 unspecified: Secondary | ICD-10-CM

## 2020-03-20 DIAGNOSIS — Z7189 Other specified counseling: Secondary | ICD-10-CM | POA: Diagnosis not present

## 2020-03-20 DIAGNOSIS — I129 Hypertensive chronic kidney disease with stage 1 through stage 4 chronic kidney disease, or unspecified chronic kidney disease: Secondary | ICD-10-CM | POA: Diagnosis present

## 2020-03-20 DIAGNOSIS — Z79899 Other long term (current) drug therapy: Secondary | ICD-10-CM | POA: Diagnosis not present

## 2020-03-20 DIAGNOSIS — H409 Unspecified glaucoma: Secondary | ICD-10-CM | POA: Diagnosis present

## 2020-03-20 DIAGNOSIS — S72114A Nondisplaced fracture of greater trochanter of right femur, initial encounter for closed fracture: Secondary | ICD-10-CM | POA: Diagnosis present

## 2020-03-20 DIAGNOSIS — R296 Repeated falls: Secondary | ICD-10-CM | POA: Diagnosis present

## 2020-03-20 DIAGNOSIS — Z20822 Contact with and (suspected) exposure to covid-19: Secondary | ICD-10-CM | POA: Diagnosis present

## 2020-03-20 HISTORY — PX: INTRAMEDULLARY (IM) NAIL INTERTROCHANTERIC: SHX5875

## 2020-03-20 LAB — TYPE AND SCREEN
ABO/RH(D): O POS
Antibody Screen: NEGATIVE

## 2020-03-20 LAB — CBC
HCT: 30.9 % — ABNORMAL LOW (ref 36.0–46.0)
Hemoglobin: 10 g/dL — ABNORMAL LOW (ref 12.0–15.0)
MCH: 31.2 pg (ref 26.0–34.0)
MCHC: 32.4 g/dL (ref 30.0–36.0)
MCV: 96.3 fL (ref 80.0–100.0)
Platelets: 172 10*3/uL (ref 150–400)
RBC: 3.21 MIL/uL — ABNORMAL LOW (ref 3.87–5.11)
RDW: 14.2 % (ref 11.5–15.5)
WBC: 12.6 10*3/uL — ABNORMAL HIGH (ref 4.0–10.5)
nRBC: 0 % (ref 0.0–0.2)

## 2020-03-20 LAB — URINALYSIS, COMPLETE (UACMP) WITH MICROSCOPIC
Bilirubin Urine: NEGATIVE
Glucose, UA: NEGATIVE mg/dL
Ketones, ur: 5 mg/dL — AB
Nitrite: NEGATIVE
Protein, ur: 100 mg/dL — AB
Specific Gravity, Urine: 1.019 (ref 1.005–1.030)
pH: 5 (ref 5.0–8.0)

## 2020-03-20 LAB — CREATININE, SERUM
Creatinine, Ser: 1.65 mg/dL — ABNORMAL HIGH (ref 0.44–1.00)
GFR calc Af Amer: 30 mL/min — ABNORMAL LOW (ref >60)
GFR calc non Af Amer: 26 mL/min — ABNORMAL LOW (ref >60)

## 2020-03-20 LAB — SARS CORONAVIRUS 2 BY RT PCR (HOSPITAL ORDER, PERFORMED IN ~~LOC~~ HOSPITAL LAB): SARS Coronavirus 2: NEGATIVE

## 2020-03-20 LAB — PROCALCITONIN: Procalcitonin: 1.57 ng/mL

## 2020-03-20 LAB — TROPONIN I (HIGH SENSITIVITY): Troponin I (High Sensitivity): 32 ng/L — ABNORMAL HIGH (ref <18)

## 2020-03-20 LAB — GLUCOSE, CAPILLARY: Glucose-Capillary: 96 mg/dL (ref 70–99)

## 2020-03-20 SURGERY — FIXATION, FRACTURE, INTERTROCHANTERIC, WITH INTRAMEDULLARY ROD
Anesthesia: Spinal | Laterality: Right

## 2020-03-20 MED ORDER — SODIUM CHLORIDE (PF) 0.9 % IJ SOLN
INTRAMUSCULAR | Status: AC
  Filled 2020-03-20: qty 10

## 2020-03-20 MED ORDER — DOCUSATE SODIUM 100 MG PO CAPS
100.0000 mg | ORAL_CAPSULE | Freq: Two times a day (BID) | ORAL | Status: DC
  Administered 2020-03-21: 100 mg via ORAL
  Filled 2020-03-20: qty 1

## 2020-03-20 MED ORDER — METOCLOPRAMIDE HCL 5 MG/ML IJ SOLN
5.0000 mg | Freq: Three times a day (TID) | INTRAMUSCULAR | Status: DC | PRN
  Filled 2020-03-20: qty 2

## 2020-03-20 MED ORDER — CHLORHEXIDINE GLUCONATE CLOTH 2 % EX PADS
6.0000 | MEDICATED_PAD | Freq: Every day | CUTANEOUS | Status: DC
  Administered 2020-03-21: 6 via TOPICAL

## 2020-03-20 MED ORDER — PROPOFOL 500 MG/50ML IV EMUL
INTRAVENOUS | Status: AC
  Filled 2020-03-20: qty 50

## 2020-03-20 MED ORDER — HYDROCODONE-ACETAMINOPHEN 5-325 MG PO TABS: 1.0000 | ORAL_TABLET | ORAL | Status: DC | PRN

## 2020-03-20 MED ORDER — BRIMONIDINE TARTRATE 0.2 % OP SOLN
1.0000 [drp] | Freq: Two times a day (BID) | OPHTHALMIC | Status: DC
  Administered 2020-03-21 – 2020-03-24 (×7): 1 [drp] via OPHTHALMIC
  Filled 2020-03-20: qty 5

## 2020-03-20 MED ORDER — PHENYLEPHRINE HCL (PRESSORS) 10 MG/ML IV SOLN
INTRAVENOUS | Status: DC | PRN
  Administered 2020-03-20: 200 ug via INTRAVENOUS

## 2020-03-20 MED ORDER — MAGNESIUM HYDROXIDE 400 MG/5ML PO SUSP: 30.0000 mL | Freq: Every day | ORAL | Status: DC | PRN

## 2020-03-20 MED ORDER — CEFAZOLIN SODIUM-DEXTROSE 1-4 GM/50ML-% IV SOLN
1.0000 g | Freq: Once | INTRAVENOUS | Status: AC
  Administered 2020-03-20: 1 g via INTRAVENOUS
  Filled 2020-03-20: qty 50

## 2020-03-20 MED ORDER — HYDROCODONE-ACETAMINOPHEN 7.5-325 MG PO TABS: 1.0000 | ORAL_TABLET | ORAL | Status: DC | PRN

## 2020-03-20 MED ORDER — SODIUM CHLORIDE 0.9 % IV SOLN: INTRAVENOUS | Status: DC

## 2020-03-20 MED ORDER — ENOXAPARIN SODIUM 30 MG/0.3ML ~~LOC~~ SOLN
30.0000 mg | SUBCUTANEOUS | Status: DC
  Administered 2020-03-21 – 2020-03-24 (×4): 30 mg via SUBCUTANEOUS
  Filled 2020-03-20 (×4): qty 0.3

## 2020-03-20 MED ORDER — MENTHOL 3 MG MT LOZG
1.0000 | LOZENGE | OROMUCOSAL | Status: DC | PRN
  Filled 2020-03-20: qty 9

## 2020-03-20 MED ORDER — SODIUM CHLORIDE 0.9 % IV BOLUS
500.0000 mL | Freq: Once | INTRAVENOUS | Status: AC
  Administered 2020-03-20: 500 mL via INTRAVENOUS

## 2020-03-20 MED ORDER — CEFAZOLIN SODIUM 1 G IJ SOLR
INTRAMUSCULAR | Status: AC
  Filled 2020-03-20: qty 10

## 2020-03-20 MED ORDER — AMLODIPINE BESYLATE 10 MG PO TABS
10.0000 mg | ORAL_TABLET | Freq: Every day | ORAL | Status: DC
  Administered 2020-03-20 – 2020-03-21 (×2): 10 mg via ORAL
  Filled 2020-03-20 (×2): qty 1

## 2020-03-20 MED ORDER — CEFAZOLIN SODIUM-DEXTROSE 1-4 GM/50ML-% IV SOLN
1.0000 g | Freq: Four times a day (QID) | INTRAVENOUS | Status: AC
  Administered 2020-03-20 – 2020-03-21 (×3): 1 g via INTRAVENOUS
  Filled 2020-03-20 (×3): qty 50

## 2020-03-20 MED ORDER — ADULT MULTIVITAMIN W/MINERALS CH
1.0000 | ORAL_TABLET | Freq: Every day | ORAL | Status: DC
  Administered 2020-03-21 – 2020-03-24 (×4): 1 via ORAL
  Filled 2020-03-20 (×5): qty 1

## 2020-03-20 MED ORDER — BUPIVACAINE HCL (PF) 0.5 % IJ SOLN
INTRAMUSCULAR | Status: DC | PRN
  Administered 2020-03-20: 2.5 mL

## 2020-03-20 MED ORDER — SODIUM CHLORIDE 0.9 % IV SOLN: 500.0000 mg | INTRAVENOUS | Status: DC

## 2020-03-20 MED ORDER — DOCUSATE SODIUM 100 MG PO CAPS: 100.0000 mg | ORAL_CAPSULE | Freq: Two times a day (BID) | ORAL | Status: DC

## 2020-03-20 MED ORDER — PROPOFOL 10 MG/ML IV BOLUS
INTRAVENOUS | Status: DC | PRN
  Administered 2020-03-20: 10 mg via INTRAVENOUS

## 2020-03-20 MED ORDER — SIMVASTATIN 20 MG PO TABS
20.0000 mg | ORAL_TABLET | Freq: Every day | ORAL | Status: DC
  Administered 2020-03-20 – 2020-03-24 (×5): 20 mg via ORAL
  Filled 2020-03-20 (×5): qty 1

## 2020-03-20 MED ORDER — ONDANSETRON HCL 4 MG/2ML IJ SOLN: 4.0000 mg | Freq: Once | INTRAMUSCULAR | Status: DC | PRN

## 2020-03-20 MED ORDER — ONDANSETRON HCL 4 MG PO TABS: 4.0000 mg | ORAL_TABLET | Freq: Four times a day (QID) | ORAL | Status: DC | PRN

## 2020-03-20 MED ORDER — PROPOFOL 500 MG/50ML IV EMUL
INTRAVENOUS | Status: DC | PRN
  Administered 2020-03-20: 40 ug/kg/min via INTRAVENOUS

## 2020-03-20 MED ORDER — ENSURE ENLIVE PO LIQD
237.0000 mL | Freq: Two times a day (BID) | ORAL | Status: DC
  Administered 2020-03-21 – 2020-03-23 (×5): 237 mL via ORAL
  Filled 2020-03-20 (×2): qty 237

## 2020-03-20 MED ORDER — MORPHINE SULFATE (PF) 2 MG/ML IV SOLN: 2.0000 mg | INTRAVENOUS | Status: DC | PRN

## 2020-03-20 MED ORDER — ONDANSETRON HCL 4 MG/2ML IJ SOLN: 4.0000 mg | Freq: Four times a day (QID) | INTRAMUSCULAR | Status: DC | PRN

## 2020-03-20 MED ORDER — ACETAMINOPHEN 325 MG PO TABS: 325.0000 mg | ORAL_TABLET | Freq: Four times a day (QID) | ORAL | Status: DC | PRN

## 2020-03-20 MED ORDER — LACTATED RINGERS IV SOLN: INTRAVENOUS | Status: DC

## 2020-03-20 MED ORDER — LEVOFLOXACIN IN D5W 500 MG/100ML IV SOLN: 500.0000 mg | INTRAVENOUS | Status: DC

## 2020-03-20 MED ORDER — PHENOL 1.4 % MT LIQD
1.0000 | OROMUCOSAL | Status: DC | PRN
  Filled 2020-03-20: qty 177

## 2020-03-20 MED ORDER — MORPHINE SULFATE (PF) 2 MG/ML IV SOLN: 1.0000 mg | INTRAVENOUS | Status: DC | PRN

## 2020-03-20 MED ORDER — ALUM & MAG HYDROXIDE-SIMETH 200-200-20 MG/5ML PO SUSP: 30.0000 mL | ORAL | Status: DC | PRN

## 2020-03-20 MED ORDER — TRAMADOL HCL 50 MG PO TABS
50.0000 mg | ORAL_TABLET | Freq: Four times a day (QID) | ORAL | Status: DC | PRN
  Administered 2020-03-20: 50 mg via ORAL
  Filled 2020-03-20: qty 1

## 2020-03-20 MED ORDER — LEVOFLOXACIN IN D5W 250 MG/50ML IV SOLN: 250.0000 mg | INTRAVENOUS | Status: DC

## 2020-03-20 MED ORDER — SODIUM CHLORIDE 0.9 % IV SOLN: INTRAVENOUS | Status: AC

## 2020-03-20 MED ORDER — DEXTROSE 5 % IV SOLN: 250.0000 mg | INTRAVENOUS | Status: DC

## 2020-03-20 MED ORDER — SODIUM CHLORIDE 0.9 % IV SOLN
2.0000 g | INTRAVENOUS | Status: DC
  Filled 2020-03-20: qty 20

## 2020-03-20 MED ORDER — FENTANYL CITRATE (PF) 100 MCG/2ML IJ SOLN: 25.0000 ug | INTRAMUSCULAR | Status: DC | PRN

## 2020-03-20 MED ORDER — LEVOFLOXACIN IN D5W 750 MG/150ML IV SOLN
750.0000 mg | Freq: Once | INTRAVENOUS | Status: AC
  Administered 2020-03-20: 750 mg via INTRAVENOUS
  Filled 2020-03-20: qty 150

## 2020-03-20 MED ORDER — METOCLOPRAMIDE HCL 10 MG PO TABS
5.0000 mg | ORAL_TABLET | Freq: Three times a day (TID) | ORAL | Status: DC | PRN
  Administered 2020-03-22: 10 mg via ORAL
  Filled 2020-03-20: qty 1

## 2020-03-20 MED ORDER — FEBUXOSTAT 40 MG PO TABS
40.0000 mg | ORAL_TABLET | Freq: Every day | ORAL | Status: DC
  Administered 2020-03-20 – 2020-03-24 (×5): 40 mg via ORAL
  Filled 2020-03-20 (×6): qty 1

## 2020-03-20 MED ORDER — FENTANYL CITRATE (PF) 100 MCG/2ML IJ SOLN
INTRAMUSCULAR | Status: AC
  Filled 2020-03-20: qty 2

## 2020-03-20 MED ORDER — MORPHINE SULFATE (PF) 2 MG/ML IV SOLN: 0.5000 mg | INTRAVENOUS | Status: DC | PRN

## 2020-03-20 MED ORDER — DORZOLAMIDE HCL-TIMOLOL MAL 2-0.5 % OP SOLN
1.0000 [drp] | Freq: Two times a day (BID) | OPHTHALMIC | Status: DC
  Administered 2020-03-20 – 2020-03-24 (×8): 1 [drp] via OPHTHALMIC
  Filled 2020-03-20: qty 10

## 2020-03-20 MED ORDER — BISACODYL 10 MG RE SUPP: 10.0000 mg | Freq: Every day | RECTAL | Status: DC | PRN

## 2020-03-20 MED ORDER — HYDRALAZINE HCL 50 MG PO TABS
50.0000 mg | ORAL_TABLET | Freq: Two times a day (BID) | ORAL | Status: DC
  Administered 2020-03-20 – 2020-03-21 (×2): 50 mg via ORAL
  Filled 2020-03-20 (×2): qty 1

## 2020-03-20 MED ORDER — MAGNESIUM CITRATE PO SOLN
1.0000 | Freq: Once | ORAL | Status: DC | PRN
  Filled 2020-03-20: qty 296

## 2020-03-20 MED ORDER — SENNOSIDES-DOCUSATE SODIUM 8.6-50 MG PO TABS: 1.0000 | ORAL_TABLET | Freq: Every evening | ORAL | Status: DC | PRN

## 2020-03-20 MED ORDER — NEOMYCIN-POLYMYXIN B GU 40-200000 IR SOLN
Status: DC | PRN
  Administered 2020-03-20: 2 mL

## 2020-03-20 SURGICAL SUPPLY — 38 items
APL PRP STRL LF DISP 70% ISPRP (MISCELLANEOUS) ×1
BIT DRILL 4.3MMS DISTAL GRDTED (BIT) ×1 IMPLANT
BNDG COHESIVE 6X5 TAN STRL LF (GAUZE/BANDAGES/DRESSINGS) ×2 IMPLANT
CANISTER SUCT 1200ML W/VALVE (MISCELLANEOUS) ×2 IMPLANT
CHLORAPREP W/TINT 26 (MISCELLANEOUS) ×2 IMPLANT
COVER WAND RF STERILE (DRAPES) ×2 IMPLANT
DRAPE 3/4 80X56 (DRAPES) ×2 IMPLANT
DRAPE U-SHAPE 47X51 STRL (DRAPES) ×2 IMPLANT
DRILL 4.3MMS DISTAL GRADUATED (BIT) ×2
DRSG OPSITE POSTOP 3X4 (GAUZE/BANDAGES/DRESSINGS) ×4 IMPLANT
DRSG OPSITE POSTOP 4X6 (GAUZE/BANDAGES/DRESSINGS) ×2 IMPLANT
GLOVE BIOGEL PI IND STRL 9 (GLOVE) ×1 IMPLANT
GLOVE BIOGEL PI INDICATOR 9 (GLOVE) ×1
GLOVE SURG SYN 9.0  PF PI (GLOVE) ×2
GLOVE SURG SYN 9.0 PF PI (GLOVE) ×1 IMPLANT
GOWN SRG 2XL LVL 4 RGLN SLV (GOWNS) ×1 IMPLANT
GOWN STRL NON-REIN 2XL LVL4 (GOWNS) ×2
GOWN STRL REUS W/ TWL LRG LVL3 (GOWN DISPOSABLE) ×1 IMPLANT
GOWN STRL REUS W/TWL LRG LVL3 (GOWN DISPOSABLE) ×2
GUIDEPIN VERSANAIL DSP 3.2X444 (ORTHOPEDIC DISPOSABLE SUPPLIES) ×2 IMPLANT
GUIDEWIRE BALL NOSE 100CM (WIRE) ×2 IMPLANT
HFN RH 130 DEG 9MM X 380MM (Nail) ×2 IMPLANT
HIP FRA NAIL LAG SCREW 10.5X90 (Orthopedic Implant) ×2 IMPLANT
KIT TURNOVER KIT A (KITS) ×2 IMPLANT
MAT ABSORB  FLUID 56X50 GRAY (MISCELLANEOUS) ×2
MAT ABSORB FLUID 56X50 GRAY (MISCELLANEOUS) ×1 IMPLANT
NEEDLE FILTER BLUNT 18X 1/2SAF (NEEDLE) ×1
NEEDLE FILTER BLUNT 18X1 1/2 (NEEDLE) ×1 IMPLANT
NS IRRIG 500ML POUR BTL (IV SOLUTION) ×2 IMPLANT
PACK HIP COMPR (MISCELLANEOUS) ×2 IMPLANT
SCALPEL PROTECTED #15 DISP (BLADE) ×4 IMPLANT
SCREW BONE CORTICAL 5.0X44 (Screw) ×2 IMPLANT
SCREW LAG HIP FRA NAIL 10.5X90 (Orthopedic Implant) ×1 IMPLANT
STAPLER SKIN PROX 35W (STAPLE) ×2 IMPLANT
SUT VIC AB 1 CT1 36 (SUTURE) ×2 IMPLANT
SUT VIC AB 2-0 CT1 (SUTURE) ×2 IMPLANT
SYR 10ML LL (SYRINGE) ×2 IMPLANT
TRAY FOLEY MTR SLVR 16FR STAT (SET/KITS/TRAYS/PACK) ×2 IMPLANT

## 2020-03-20 NOTE — ED Notes (Signed)
OR tech at bedside to take pt

## 2020-03-20 NOTE — ED Provider Notes (Signed)
[facility] Emergency Department Provider Note  ____________________________________________   First MD Initiated Contact with Patient 03/20/20 0151     (approximate)  I have reviewed the triage vital signs and the nursing notes.   HISTORY  Chief Complaint Fall    HPI Hayley Maynard is a 86 y.o. female with medical history as listed below who presents for evaluation after a fall.  Her daughter is at the bedside and states she usually does not have any issues with dementia or memory although she seems a little bit confused tonight.  The patient says that she fell at home when her leg gave out on her and she landed on her right hip.  She did not hit her head and did not lose consciousness.  She denies headache, neck pain, chest pain, shortness of breath, nausea, vomiting, abdominal pain, and dysuria.  She reports that she has pain in her right hip which is worse when she moves around and better at rest.  The pain is mild to moderate.  She has no numbness nor tingling in the leg.  She is not on blood thinners.         Past Medical History:  Diagnosis Date  . Arthritis    feet  . CKD (chronic kidney disease), stage III   . Coronary artery disease   . Glaucoma   . Hyperlipidemia   . Hypertension   . LVH (left ventricular hypertrophy)   . Wears dentures    full upper and lower    Patient Active Problem List   Diagnosis Date Noted  . Displaced fracture of greater trochanter of right femur, initial encounter for closed fracture (HCC) 03/20/2020  . CAP (community acquired pneumonia) 03/20/2020  . Chronic kidney disease, stage III (moderate) 10/28/2019  . Anemia 11/08/2018  . CAD (coronary artery disease) 11/08/2018  . Mixed hyperlipidemia 11/08/2018  . Moderate mitral insufficiency 03/30/2017  . CKD (chronic kidney disease) stage 3, GFR 30-59 ml/min 01/27/2015  . LVH (left ventricular hypertrophy) due to hypertensive disease, without heart failure  01/27/2015  . Benign essential hypertension 01/09/2015    Past Surgical History:  Procedure Laterality Date  . ABDOMINAL HYSTERECTOMY    . CATARACT EXTRACTION W/ INTRAOCULAR LENS  IMPLANT, BILATERAL    . CORONARY ARTERY BYPASS GRAFT     over 10 yrs ago (per pt)  . PHOTOCOAGULATION WITH LASER Right 09/26/2018   Procedure: PHOTOCOAGULATION WITH LASER;  Surgeon: Lockie MolaBrasington, Chadwick, MD;  [facility];  Service: Ophthalmology;  Laterality: Right;  laser settings: 2000mW, 31.3% duty cycle, 120 seconds    Prior to Admission medications   Medication Sig Start Date End Date Taking? Authorizing Provider  amLODipine (NORVASC) 10 MG tablet Take 1 tablet by mouth daily. 12/23/19  Yes [provider]  brimonidine (ALPHAGAN) 0.2 % ophthalmic solution USE 1 DROP IN BOTH EYES 2 TIMES A DAY 06/05/18  Yes [provider]  dorzolamide-timolol (COSOPT) 22.3-6.8 MG/ML ophthalmic solution Place 1 drop into both eyes 2 (two) times daily. 04/29/19  Yes [provider]  febuxostat (ULORIC) 40 MG tablet Take 40 mg by mouth daily.   Yes [provider]  hydrALAZINE (APRESOLINE) 50 MG tablet Take 1 tablet by mouth 2 (two) times daily. 09/02/19  Yes [provider]  losartan (COZAAR) 50 MG tablet TAKE 1 TABLET DAILY 04/01/19  Yes [provider]  simvastatin (ZOCOR) 20 MG tablet Take 20 mg by mouth daily.   Yes [provider]  aspirin 81 MG tablet Take 81 mg by mouth daily. Patient not taking: Reported on 03/20/2020    [provider]  cyanocobalamin 1000 MCG tablet Take 1,000 mcg by mouth daily. Patient not taking: Reported on 03/20/2020    [provider]  TOPROL XL 25 MG 24 hr tablet  06/09/18   [provider]    Allergies Propoxyphene and Telmisartan-hctz  History reviewed. No pertinent family history.  Social History Social History   Tobacco Use  . Smoking status: Current Some Day Smoker    Types:  Cigarettes  . Smokeless tobacco: Never Used  . Tobacco comment: 1 pack last about 6 weeks  Vaping Use  . Vaping Use: Never used  Substance Use Topics  . Alcohol use: Not Currently  . Drug use: Not on file    Review of Systems Constitutional: No fever/chills Eyes: No visual changes. ENT: No sore throat. Cardiovascular: Denies chest pain. Respiratory: Denies shortness of breath. Gastrointestinal: No abdominal pain.  No nausea, no vomiting.  No diarrhea.  No constipation. Genitourinary: Negative for dysuria. Musculoskeletal: Pain in right hip after fall.  Negative for neck pain.  Negative for back pain. Integumentary: Negative for rash. Neurological: Negative for headaches, focal weakness or numbness.   ____________________________________________   PHYSICAL EXAM:  VITAL SIGNS: ED Triage Vitals  Enc Vitals Group     BP 03/19/20 2112 (!) 119/92     Pulse Rate 03/19/20 2112 92     Resp 03/19/20 2112 16     Temp 03/19/20 2112 98.1 F (36.7 C)     Temp Source 03/19/20 2112 Oral     SpO2 03/19/20 2112 94 %     Weight 03/19/20 2100 47.2 kg (104 lb 0.9 oz)     Height 03/19/20 2100 1.524 m (5')     Head Circumference --      Peak Flow --      Pain Score --      Pain Loc --      Pain Edu? --      Excl. in GC? --     Constitutional: Alert and oriented.  No acute distress. Eyes: Conjunctivae are normal.  Head: Atraumatic. Nose: No congestion/rhinnorhea. Mouth/Throat: Patient is wearing a mask. Neck: No stridor.  No meningeal signs.   Cardiovascular: Normal rate, regular rhythm. Good peripheral circulation. Grossly normal heart sounds. Respiratory: Normal respiratory effort.  No retractions. Gastrointestinal: Soft and nontender. No distention.  Musculoskeletal: Patient is favoring her right hip and reporting pain.  She has mild reproducible pain with passive range of motion of the right hip.  No gross deformities are evident upon visual inspection. Neurologic:  Normal  speech and language. No gross focal neurologic deficits are appreciated.  Skin:  Skin is warm, dry and intact. Psychiatric: Mood and affect are normal. Speech and behavior are normal.  ____________________________________________   LABS (all labs ordered are listed, but only abnormal results are displayed)  Labs Reviewed  BASIC METABOLIC PANEL - Abnormal; Notable for the following components:      Result Value   Glucose, Bld 107 (*)    BUN 32 (*)    Creatinine, Ser 1.65 (*)    GFR calc non Af Amer 26 (*)    GFR calc Af Amer 30 (*)    Anion gap 17 (*)    All other components within normal limits  CBC - Abnormal; Notable for the following components:   WBC 12.4 (*)    RBC 3.70 (*)  Hemoglobin 11.5 (*)    HCT 34.8 (*)    All other components within normal limits  TROPONIN I (HIGH SENSITIVITY) - Abnormal; Notable for the following components:   Troponin I (High Sensitivity) 26 (*)    All other components within normal limits  TROPONIN I (HIGH SENSITIVITY) - Abnormal; Notable for the following components:   Troponin I (High Sensitivity) 32 (*)    All other components within normal limits  SARS CORONAVIRUS 2 BY RT PCR (HOSPITAL ORDER, PERFORMED IN [facility])  EXPECTORATED SPUTUM ASSESSMENT W REFEX TO RESP CULTURE  URINALYSIS, COMPLETE (UACMP) WITH MICROSCOPIC  STREP PNEUMONIAE URINARY ANTIGEN  LEGIONELLA PNEUMOPHILA SEROGP 1 UR AG  PROCALCITONIN  CBG MONITORING, ED  TYPE AND SCREEN   ____________________________________________  EKG  ED ECG REPORT I, Loleta Rose, the attending physician, personally viewed and interpreted this ECG.  Date: 03/19/2020 EKG Time: 21: 05 Rate: 90 Rhythm: sinus rhythm with PVCs QRS Axis: Left axis deviation Intervals: normal ST/T Wave abnormalities: Non-specific ST segment / T-wave changes, but no clear evidence of acute ischemia. Narrative Interpretation: no definitive evidence of acute ischemia; does not meet STEMI  criteria.   ____________________________________________  RADIOLOGY I, Loleta Rose, personally viewed and evaluated these images (plain radiographs) as part of my medical decision making, as well as reviewing the written report by the radiologist.  ED MD interpretation: Suspicious left lower lobe patchy opacity.  CT head shows no evidence of acute intracranial abnormality.  Right hip x-ray is suspicious for a greater trochanter avulsion fracture.  CT right hip demonstrates right greater trochanter fracture, questionable femoral neck involvement.  MR right hip pending at the time of admission.  Official radiology report(s): DG Chest 2 View  Result Date: 03/19/2020 CLINICAL DATA:  86 year old with cough and congestion. EXAM: CHEST - 2 VIEW COMPARISON:  None. FINDINGS: Post median sternotomy. Upper normal heart size. Aortic atherosclerosis and tortuosity. Increased AP diameter of the thorax. Patchy basilar opacity likely localizing to the left lower lobe. Trace pleural effusions. Mild interstitial coarsening. No pneumothorax. Bones are under mineralized. IMPRESSION: 1. Patchy left lower lobe opacity suspicious for pneumonia in the setting of cough. 2. Trace pleural effusions. 3. Post CABG.  Aortic atherosclerosis and tortuosity. Aortic Atherosclerosis (ICD10-I70.0). Electronically Signed   By: Narda Rutherford M.D.   On: 03/19/2020 21:27   CT Head Wo Contrast  Result Date: 03/19/2020 CLINICAL DATA:  Mental status change, fall EXAM: CT HEAD WITHOUT CONTRAST TECHNIQUE: Contiguous axial images were obtained from the base of the skull through the vertex without intravenous contrast. COMPARISON:  None. FINDINGS: Brain: No evidence of acute territorial infarction, hemorrhage, hydrocephalus,extra-axial collection or mass lesion/mass effect. There is dilatation the ventricles and sulci consistent with age-related atrophy. Low-attenuation changes in the deep white matter consistent with small vessel ischemia.  Vascular: No hyperdense vessel or unexpected calcification. Skull: The skull is intact. No fracture or focal lesion identified. Sinuses/Orbits: The visualized paranasal sinuses and mastoid air cells are clear. The orbits and globes intact. Other: None IMPRESSION: No acute intracranial abnormality. Findings consistent with age related atrophy and chronic small vessel ischemia Electronically Signed   By: Jonna Clark M.D.   On: 03/19/2020 23:38   CT Chest Wo Contrast  Result Date: 03/20/2020 CLINICAL DATA:  Pneumonia EXAM: CT CHEST WITHOUT CONTRAST TECHNIQUE: Multidetector CT imaging of the chest was performed following the standard protocol without IV contrast. COMPARISON:  Chest radiograph March 19, 2020 FINDINGS: Cardiovascular: There is aortic atherosclerosis. The aorta appears overall  prominent. The measured diameter in the aortic arch region is 3.5 cm. There is extensive aortic atherosclerosis. There are multiple foci of calcification in visualized great vessels. There are multiple foci of native coronary artery calcification. Patient is status post coronary artery bypass grafting. There is calcification in the mitral annulus. There is no pericardial effusion or pericardial thickening. Mediastinum/Nodes: There are subcentimeter thyroid nodular lesions. There is no dominant thyroid mass. There are occasional subcentimeter mediastinal lymph nodes which do not meet size criteria for pathologic significance. No appreciable esophageal lesions. Lungs/Pleura: There is underlying centrilobular and paraseptal emphysematous change. There is atelectatic change in the lung bases. There is mild consolidation in the posteromedial left base. A second focus of apparent consolidation is noted in the anterolateral left base. No appreciable pleural effusions. Upper Abdomen: There is extensive upper abdominal vascular atherosclerosis. There is a calcification in the region of the gallbladder fossa, a likely gallstone,  incompletely visualized. Visualized upper abdominal structures otherwise appear unremarkable. Musculoskeletal: Patient is status post median sternotomy. There is degenerative change throughout the thoracic spine. There are no blastic or lytic bone lesions. No chest wall lesions evident. IMPRESSION: 1. Underlying emphysematous change. Patchy bibasilar atelectasis with areas of suspected consolidation/pneumonia in the anterolateral and posteromedial left base regions. No similar consolidation elsewhere. No appreciable pleural effusion. 2. Extensive aortic atherosclerosis. Prominence of the thoracic aorta with measured diameter in the aortic arch of 3.5 cm. Recommend annual imaging followup by CTA or MRA if overall clinical condition so warrants. This recommendation follows 2010 ACCF/AHA/AATS/ACR/ASA/SCA/SCAI/SIR/STS/SVM Guidelines for the Diagnosis and Management of Patients with Thoracic Aortic Disease. Circulation.2010; 121: U981-X914. Aortic aneurysm NOS (ICD10-I71.9) 3.  Status post coronary artery bypass grafting. 4. Questionable gallstone in right upper quadrant, incompletely visualized. Aortic Atherosclerosis (ICD10-I70.0) and Emphysema (ICD10-J43.9). Electronically Signed   By: Bretta Bang III M.D.   On: 03/20/2020 05:28   CT Hip Right Wo Contrast  Result Date: 03/20/2020 CLINICAL DATA:  Fall with pain EXAM: CT OF THE RIGHT HIP WITHOUT CONTRAST TECHNIQUE: Multidetector CT imaging of the right hip was performed according to the standard protocol. Multiplanar CT image reconstructions were also generated. COMPARISON:  Radiograph same day FINDINGS: Bones/Joint/Cartilage There is a comminuted impacted mildly displaced fracture involving the greater trochanter. No other fractures are seen. There is moderate right hip osteoarthritis with superior joint space loss and marginal osteophyte formation. No large hip joint effusion. Ligaments Suboptimally assessed by CT. Muscles and Tendons The muscles surrounding  the hip are intact. The tendons appear to be grossly intact. A small fatty containing lesion is seen with anterior rectus femoris musculature, likely lipoma. Soft tissues Subcutaneous edema seen over the lateral aspect of the hip. There is scattered colonic diverticula. Scattered dense vascular calcifications are noted. IMPRESSION: Comminuted impacted mildly displaced fracture of the greater trochanter. Electronically Signed   By: Jonna Clark M.D.   On: 03/20/2020 03:28   DG Hip Unilat W or Wo Pelvis 2-3 Views Right  Result Date: 03/19/2020 CLINICAL DATA:  Several falls today with hip pain, initial encounter EXAM: DG HIP (WITH OR WITHOUT PELVIS) 2-3V RIGHT COMPARISON:  None. FINDINGS: Pelvic ring is intact. No dislocation is noted. Some lucency is noted in the region of the right greater trochanter which may represent an undisplaced greater trochanter fracture. No definitive right femoral neck fracture is seen. No soft tissue abnormality is seen. IMPRESSION: Changes suspicious for avulsion fracture from the right greater trochanter. CT can be performed as clinically indicated. Electronically Signed   By:  Alcide Clever M.D.   On: 03/19/2020 23:51    ____________________________________________   PROCEDURES   Procedure(s) performed (including Critical Care):  Procedures   ____________________________________________   INITIAL IMPRESSION / MDM / ASSESSMENT AND PLAN / ED COURSE  As part of my medical decision making, I reviewed the following data within the electronic MEDICAL RECORD NUMBER Nursing notes reviewed and incorporated, Labs reviewed , EKG interpreted , Old chart reviewed, Radiograph reviewed , Discussed with admitting physician (Dr. Antionette Char), Discussed with orthopedics (Dr. Rosita Kea) and Notes from prior ED visits.   Differential diagnosis includes, but is not limited to, hip/femur fracture, intracranial injury, electrolyte or metabolic abnormality, acute infection.  The patient's daughter  reports that she has been confused tonight but she is alert and oriented for me and gives a good history.  I believe she may have a musculoskeletal injury to her right hip.  X-rays were nondiagnostic so I am proceeding with a CT right hip although I discussed with the patient and her daughter that an MRI may be necessary although I doubt it.  She is not having any respiratory symptoms even though there is a suspicious finding on the x-ray for pneumonia.  However given the lack of cough and lack of respiratory issues is unclear what to make of this nonspecific finding.  However she does have a mild leukocytosis of 12.4.  Her basic metabolic panel is notable for a creatinine of 1.65.  According to care everywhere, her creatinine was 1.4 about 2 months ago and 1.26 months before that.  This indicates at least degree of renal failure given her small habitus and limited muscle mass, but I suspect this is more of a chronic progression than an acute issue.  I will reassess once the results of her hip CT are back to determine the appropriate disposition plan.       Clinical Course as of Mar 20 536  Fri Mar 20, 2020  0320 Increase of 6 in HS troponin  Troponin I (High Sensitivity)(!) [CF]  0345 I discussed the case by phone with Dr. Rosita Kea of the orthopedic service.  He said the patient needs to come into the hospital regardless but ask if I can get an MRI of her hip because the involvement of the femoral neck will determine how they approach the situation surgically (pins versus rods).  Sometimes greater trochanter injuries do not need surgical intervention but he thinks the neck might be starting to become involved.I have put in a consult to the hospitalist service for admission.  I ordered an MRI of the right hip without contrast.  I also ordered a chest CT without contrast for further evaluation of the suspicious opacity seen on chest x-ray.  I discussed the findings with her daughter and her daughter said  that she has not been having any respiratory difficulties or cough until she first arrived in the emergency department where she coughed a few times but has not had a cough since then.  The daughter would prefer to avoid empiric antibiotics if she does not in fact have pneumonia so I think the chest CT for further characterization is a good idea.   [CF]  [phone] Discussed case with Dr. Antionette Char who will admit.  He understands the plan regarding the CT chest to rule out (or in) the pneumonia.   [CF]    Clinical Course User Index [CF] Loleta Rose, MD     ____________________________________________  FINAL CLINICAL IMPRESSION(S) / ED DIAGNOSES  Final diagnoses:  Displaced fracture of greater trochanter of right femur, initial encounter for closed fracture (HCC)  Questionable pneumonia on CXR; CT pending at time of admission   MEDICATIONS GIVEN DURING THIS VISIT:  Medications  sodium chloride 0.9 % bolus 500 mL (has no administration in time range)  morphine 2 MG/ML injection 1-2 mg (has no administration in time range)  0.9 %  sodium chloride infusion (has no administration in time range)     ED Discharge Orders    None      *Please note:  ESRAA SERES was evaluated in Emergency Department on 03/20/2020 for the symptoms described in the history of present illness. She was evaluated in the context of the global COVID-19 pandemic, which necessitated consideration that the patient might be at risk for infection with the SARS-CoV-2 virus that causes COVID-19. Institutional protocols and algorithms that pertain to the evaluation of patients at risk for COVID-19 are in a state of rapid change based on information released by regulatory bodies including the CDC and federal and state organizations. These policies and algorithms were followed during the patient's care in the ED.  Some ED evaluations and interventions may be delayed as a result of limited staffing during and after the  pandemic.*  Note:  This document was prepared using Dragon voice recognition software and may include unintentional dictation errors.   Loleta Rose, MD 03/20/20 [phone]

## 2020-03-20 NOTE — Consult Note (Signed)
Reason for Consult: Right hip fracture Referring Physician: Dr. Alcario DroughtForbach  Hayley Maynard is an 86 y.o. female.  HPI: Patient is a 86 year old household ambulator who felt her leg give out and fell to the ground and was unable to ambulate following this.  She was brought to the emergency room and initial x-ray showed greater trochanter fracture CT confirm this and MRI confirms that it goes to the intertrochanteric region.  She denies prodromal symptoms.  She really only gets out of the house to be brought to the doctor's office or something similar.  Past Medical History:  Diagnosis Date  . Arthritis    feet  . CKD (chronic kidney disease), stage III   . Coronary artery disease   . Glaucoma   . Hyperlipidemia   . Hypertension   . LVH (left ventricular hypertrophy)   . Wears dentures    full upper and lower    Past Surgical History:  Procedure Laterality Date  . ABDOMINAL HYSTERECTOMY    . CATARACT EXTRACTION W/ INTRAOCULAR LENS  IMPLANT, BILATERAL    . CORONARY ARTERY BYPASS GRAFT     over 10 yrs ago (per pt)  . PHOTOCOAGULATION WITH LASER Right 09/26/2018   Procedure: PHOTOCOAGULATION WITH LASER;  Surgeon: Lockie MolaBrasington, Chadwick, MD;  [facility];  Service: Ophthalmology;  Laterality: Right;  laser settings: 2000mW, 31.3% duty cycle, 120 seconds    History reviewed. No pertinent family history.  Social History:  reports that she has been smoking cigarettes. She has never used smokeless tobacco. She reports previous alcohol use. No history on file for drug use.  Allergies:  Allergies  Allergen Reactions  . Propoxyphene Other (See Comments)  . Telmisartan-Hctz Other (See Comments)    Medications: I have reviewed the patient's current medications.  Results for orders placed or performed during the hospital encounter of 03/20/20 (from the past 48 hour(s))  Basic metabolic panel     Status: Abnormal   Collection Time: 03/19/20  9:10 PM  Result Value Ref Range    Sodium 141 135 - 145 mmol/L   Potassium 4.2 3.5 - 5.1 mmol/L   Chloride 101 98 - 111 mmol/L   CO2 23 22 - 32 mmol/L   Glucose, Bld 107 (H) 70 - 99 mg/dL    Comment: Glucose reference range applies only to samples taken after fasting for at least 8 hours.   BUN 32 (H) 8 - 23 mg/dL   Creatinine, Ser 0.861.65 (H) 0.44 - 1.00 mg/dL   Calcium 9.1 8.9 - 57.810.3 mg/dL   GFR calc non Af Amer 26 (L) >60 mL/min   GFR calc Af Amer 30 (L) >60 mL/min   Anion gap 17 (H) 5 - 15    Comment: Performed at [facility], [address] [place], [place] [id number]  CBC     Status: Abnormal   Collection Time: 03/19/20  9:10 PM  Result Value Ref Range   WBC 12.4 (H) 4.0 - 10.5 K/uL   RBC 3.70 (L) 3.87 - 5.11 MIL/uL   Hemoglobin 11.5 (L) 12.0 - 15.0 g/dL   HCT 95.234.8 (L) 36 - 46 %   MCV 94.1 80.0 - 100.0 fL   MCH 31.1 26.0 - 34.0 pg   MCHC 33.0 30.0 - 36.0 g/dL   RDW 84.113.7 32.411.5 - 40.115.5 %   Platelets 206 150 - 400 K/uL   nRBC 0.0 0.0 - 0.2 %    Comment: Performed at [facility], [address]  [place], [place] [address]  Troponin I (High Sensitivity)     Status: Abnormal   Collection Time: 03/19/20  9:10 PM  Result Value Ref Range   Troponin I (High Sensitivity) 26 (H) <18 ng/L    Comment: (NOTE) Elevated high sensitivity troponin I (hsTnI) values and significant  changes across serial measurements may suggest ACS but many other  chronic and acute conditions are known to elevate hsTnI results.  Refer to the "Links" section for chest pain algorithms and additional  guidance. Performed at [facility], [address] [place], [place] [address]   Troponin I (High Sensitivity)     Status: Abnormal   Collection Time: 03/20/20  2:27 AM  Result Value Ref Range   Troponin I (High Sensitivity) 32 (H) <18 ng/L    Comment: (NOTE) Elevated high sensitivity troponin I (hsTnI) values and significant  changes across serial measurements may suggest ACS but many other  chronic and acute  conditions are known to elevate hsTnI results.  Refer to the "Links" section for chest pain algorithms and additional  guidance. Performed at [facility], [address] [place], [place] [address]   SARS Coronavirus 2 by RT PCR (hospital order, performed in [facility] hospital lab) Nasopharyngeal Nasopharyngeal Swab     Status: None   Collection Time: 03/20/20  4:18 AM   Specimen: Nasopharyngeal Swab  Result Value Ref Range   SARS Coronavirus 2 NEGATIVE NEGATIVE    Comment: (NOTE) SARS-CoV-2 target nucleic acids are NOT DETECTED.  The SARS-CoV-2 RNA is generally detectable in upper and lower respiratory specimens during the acute phase of infection. The lowest concentration of SARS-CoV-2 viral copies this assay can detect is 250 copies / mL. A negative result does not preclude SARS-CoV-2 infection and should not be used as the sole basis for treatment or other patient management decisions.  A negative result may occur with improper specimen collection / handling, submission of specimen other than nasopharyngeal swab, presence of viral mutation(s) within the areas targeted by this assay, and inadequate number of viral copies (<250 copies / mL). A negative result must be combined with clinical observations, patient history, and epidemiological information.  Fact Sheet for Patients:   BoilerBrush.com.cy  Fact Sheet for Healthcare Providers: https://pope.com/  This test is not yet approved or  cleared by the [place] FDA and has been authorized for detection and/or diagnosis of SARS-CoV-2 by FDA under an Emergency Use Authorization (EUA).  This EUA will remain in effect (meaning this test can be used) for the duration of the COVID-19 declaration under Section 564(b)(1) of the Act, 21 U.S.C. section 360bbb-3(b)(1), unless the authorization is terminated or revoked sooner.  Performed at [facility], [address] [place], [place] [address]   Procalcitonin - Baseline     Status: None   Collection Time: 03/20/20  5:55 AM  Result Value Ref Range   Procalcitonin 1.57 ng/mL    Comment:        Interpretation: PCT > 0.5 ng/mL and <= 2 ng/mL: Systemic infection (sepsis) is possible, but other conditions are known to elevate PCT as well. (NOTE)       Sepsis PCT Algorithm           Lower Respiratory Tract                                      Infection PCT Algorithm    ----------------------------     ----------------------------  PCT < 0.25 ng/mL                PCT < 0.10 ng/mL          Strongly encourage             Strongly discourage   discontinuation of antibiotics    initiation of antibiotics    ----------------------------     -----------------------------       PCT 0.25 - 0.50 ng/mL            PCT 0.10 - 0.25 ng/mL               OR       >80% decrease in PCT            Discourage initiation of                                            antibiotics      Encourage discontinuation           of antibiotics    ----------------------------     -----------------------------         PCT >= 0.50 ng/mL              PCT 0.26 - 0.50 ng/mL                AND       <80% decrease in PCT             Encourage initiation of                                             antibiotics       Encourage continuation           of antibiotics    ----------------------------     -----------------------------        PCT >= 0.50 ng/mL                  PCT > 0.50 ng/mL               AND         increase in PCT                  Strongly encourage                                      initiation of antibiotics    Strongly encourage escalation           of antibiotics                                     -----------------------------                                           PCT <= 0.25 ng/mL  OR                                        > 80% decrease in  PCT                                      Discontinue / Do not initiate                                             antibiotics  Performed at [facility], [address] [place], [place] [address]   Type and screen [facility]     Status: None (Preliminary result)   Collection Time: 03/20/20  6:07 AM  Result Value Ref Range   ABO/RH(D) PENDING    Antibody Screen PENDING    Sample Expiration      03/23/2020,2359 Performed at [facility], [address] [place], [place] [address]   Type and screen     Status: None (Preliminary result)   Collection Time: 03/20/20  7:09 AM  Result Value Ref Range   ABO/RH(D) PENDING    Antibody Screen PENDING    Sample Expiration      03/23/2020,2359 Performed at [facility], [address] [place], [place] [address]     DG Chest 2 View  Result Date: 03/19/2020 CLINICAL DATA:  86 year old with cough and congestion. EXAM: CHEST - 2 VIEW COMPARISON:  None. FINDINGS: Post median sternotomy. Upper normal heart size. Aortic atherosclerosis and tortuosity. Increased AP diameter of the thorax. Patchy basilar opacity likely localizing to the left lower lobe. Trace pleural effusions. Mild interstitial coarsening. No pneumothorax. Bones are under mineralized. IMPRESSION: 1. Patchy left lower lobe opacity suspicious for pneumonia in the setting of cough. 2. Trace pleural effusions. 3. Post CABG.  Aortic atherosclerosis and tortuosity. Aortic Atherosclerosis (ICD10-I70.0). Electronically Signed   By: Narda Rutherford M.D.   On: 03/19/2020 21:27   CT Head Wo Contrast  Result Date: 03/19/2020 CLINICAL DATA:  Mental status change, fall EXAM: CT HEAD WITHOUT CONTRAST TECHNIQUE: Contiguous axial images were obtained from the base of the skull through the vertex without intravenous contrast. COMPARISON:  None. FINDINGS: Brain: No evidence of acute territorial infarction, hemorrhage, hydrocephalus,extra-axial  collection or mass lesion/mass effect. There is dilatation the ventricles and sulci consistent with age-related atrophy. Low-attenuation changes in the deep white matter consistent with small vessel ischemia. Vascular: No hyperdense vessel or unexpected calcification. Skull: The skull is intact. No fracture or focal lesion identified. Sinuses/Orbits: The visualized paranasal sinuses and mastoid air cells are clear. The orbits and globes intact. Other: None IMPRESSION: No acute intracranial abnormality. Findings consistent with age related atrophy and chronic small vessel ischemia Electronically Signed   By: Jonna Clark M.D.   On: 03/19/2020 23:38   CT Chest Wo Contrast  Result Date: 03/20/2020 CLINICAL DATA:  Pneumonia EXAM: CT CHEST WITHOUT CONTRAST TECHNIQUE: Multidetector CT imaging of the chest was performed following the standard protocol without IV contrast. COMPARISON:  Chest radiograph March 19, 2020 FINDINGS: Cardiovascular: There is aortic atherosclerosis. The aorta appears overall prominent. The measured diameter in the aortic arch region is 3.5 cm. There is extensive aortic atherosclerosis. There are multiple  foci of calcification in visualized great vessels. There are multiple foci of native coronary artery calcification. Patient is status post coronary artery bypass grafting. There is calcification in the mitral annulus. There is no pericardial effusion or pericardial thickening. Mediastinum/Nodes: There are subcentimeter thyroid nodular lesions. There is no dominant thyroid mass. There are occasional subcentimeter mediastinal lymph nodes which do not meet size criteria for pathologic significance. No appreciable esophageal lesions. Lungs/Pleura: There is underlying centrilobular and paraseptal emphysematous change. There is atelectatic change in the lung bases. There is mild consolidation in the posteromedial left base. A second focus of apparent consolidation is noted in the anterolateral left  base. No appreciable pleural effusions. Upper Abdomen: There is extensive upper abdominal vascular atherosclerosis. There is a calcification in the region of the gallbladder fossa, a likely gallstone, incompletely visualized. Visualized upper abdominal structures otherwise appear unremarkable. Musculoskeletal: Patient is status post median sternotomy. There is degenerative change throughout the thoracic spine. There are no blastic or lytic bone lesions. No chest wall lesions evident. IMPRESSION: 1. Underlying emphysematous change. Patchy bibasilar atelectasis with areas of suspected consolidation/pneumonia in the anterolateral and posteromedial left base regions. No similar consolidation elsewhere. No appreciable pleural effusion. 2. Extensive aortic atherosclerosis. Prominence of the thoracic aorta with measured diameter in the aortic arch of 3.5 cm. Recommend annual imaging followup by CTA or MRA if overall clinical condition so warrants. This recommendation follows 2010 ACCF/AHA/AATS/ACR/ASA/SCA/SCAI/SIR/STS/SVM Guidelines for the Diagnosis and Management of Patients with Thoracic Aortic Disease. Circulation.2010; 121: Y073-X106. Aortic aneurysm NOS (ICD10-I71.9) 3.  Status post coronary artery bypass grafting. 4. Questionable gallstone in right upper quadrant, incompletely visualized. Aortic Atherosclerosis (ICD10-I70.0) and Emphysema (ICD10-J43.9). Electronically Signed   By: Bretta Bang III M.D.   On: 03/20/2020 05:28   CT Hip Right Wo Contrast  Result Date: 03/20/2020 CLINICAL DATA:  Fall with pain EXAM: CT OF THE RIGHT HIP WITHOUT CONTRAST TECHNIQUE: Multidetector CT imaging of the right hip was performed according to the standard protocol. Multiplanar CT image reconstructions were also generated. COMPARISON:  Radiograph same day FINDINGS: Bones/Joint/Cartilage There is a comminuted impacted mildly displaced fracture involving the greater trochanter. No other fractures are seen. There is moderate  right hip osteoarthritis with superior joint space loss and marginal osteophyte formation. No large hip joint effusion. Ligaments Suboptimally assessed by CT. Muscles and Tendons The muscles surrounding the hip are intact. The tendons appear to be grossly intact. A small fatty containing lesion is seen with anterior rectus femoris musculature, likely lipoma. Soft tissues Subcutaneous edema seen over the lateral aspect of the hip. There is scattered colonic diverticula. Scattered dense vascular calcifications are noted. IMPRESSION: Comminuted impacted mildly displaced fracture of the greater trochanter. Electronically Signed   By: Jonna Clark M.D.   On: 03/20/2020 03:28   DG Hip Unilat W or Wo Pelvis 2-3 Views Right  Result Date: 03/19/2020 CLINICAL DATA:  Several falls today with hip pain, initial encounter EXAM: DG HIP (WITH OR WITHOUT PELVIS) 2-3V RIGHT COMPARISON:  None. FINDINGS: Pelvic ring is intact. No dislocation is noted. Some lucency is noted in the region of the right greater trochanter which may represent an undisplaced greater trochanter fracture. No definitive right femoral neck fracture is seen. No soft tissue abnormality is seen. IMPRESSION: Changes suspicious for avulsion fracture from the right greater trochanter. CT can be performed as clinically indicated. Electronically Signed   By: Alcide Clever M.D.   On: 03/19/2020 23:51    Review of Systems Blood pressure (!) 137/69, pulse  69, temperature 98.1 F (36.7 C), temperature source Oral, resp. rate 19, height 5' (1.524 m), weight 47.2 kg, SpO2 96 %. Physical Exam Right leg is externally rotated pain with any motion.  She does have trace dorsalis pedis and posterior tib pulse without significant edema. Skin around the hip is intact. Radiographic review as noted per HPI nondisplaced right intertrochanteric fracture Assessment/Plan: Impression is right intertrochanteric hip fracture nondisplaced Plan is for ORIF to prevent displacement  and allow for her to ambulate.  Kennedy Bucker 03/20/2020, 7:58 AM

## 2020-03-20 NOTE — ED Notes (Signed)
Registration at bedside.

## 2020-03-20 NOTE — ED Notes (Signed)
Mac RN and this RN unable to obtain PIV access.

## 2020-03-20 NOTE — ED Notes (Signed)
Admitting MD at bedside.

## 2020-03-20 NOTE — Anesthesia Procedure Notes (Signed)
Spinal  Patient location during procedure: OR Staffing Performed: anesthesiologist  Anesthesiologist: Yves Dill, MD Resident/CRNA: Berniece Pap, CRNA Preanesthetic Checklist Completed: patient identified, IV checked, site marked, risks and benefits discussed, surgical consent, monitors and equipment checked, pre-op evaluation and timeout performed Spinal Block Patient position: sitting Prep: DuraPrep Patient monitoring: heart rate, cardiac monitor, continuous pulse ox and blood pressure Approach: midline Location: L3-4 Injection technique: single-shot Needle Needle type: Sprotte, Quincke and Spinocan  Needle gauge: 25 G Needle length: 9 cm Assessment Sensory level: T4 Additional Notes 2 attempts, 1st attempt by Marciano Sequin, CRNA

## 2020-03-20 NOTE — ED Notes (Signed)
Patient returned from MRI.

## 2020-03-20 NOTE — Anesthesia Preprocedure Evaluation (Signed)
Anesthesia Evaluation  Patient identified by MRN, date of birth, ID band Patient awake    Reviewed: Allergy & Precautions, H&P , NPO status , Patient's Chart, lab work & pertinent test results, reviewed documented beta blocker date and time   History of Anesthesia Complications Negative for: history of anesthetic complications  Airway Mallampati: II  TM Distance: >3 FB Neck ROM: full  Mouth opening: Limited Mouth Opening  Dental  (+) Edentulous Upper, Edentulous Lower   Pulmonary pneumonia, Current Smoker and Patient abstained from smoking.,    Pulmonary exam normal breath sounds clear to auscultation       Cardiovascular hypertension, Pt. on medications and Pt. on home beta blockers + CAD  Normal cardiovascular exam Rhythm:regular Rate:Normal     Neuro/Psych negative neurological ROS  negative psych ROS   GI/Hepatic negative GI ROS, Neg liver ROS,   Endo/Other  negative endocrine ROS  Renal/GU Renal InsufficiencyRenal disease  negative genitourinary   Musculoskeletal  (+) Arthritis ,   Abdominal   Peds  Hematology negative hematology ROS (+) anemia ,   Anesthesia Other Findings Past Medical History: No date: Arthritis     Comment:  feet No date: CKD (chronic kidney disease), stage III No date: Coronary artery disease No date: Glaucoma No date: Hyperlipidemia No date: Hypertension No date: LVH (left ventricular hypertrophy) No date: Wears dentures     Comment:  full upper and lower  Reproductive/Obstetrics                             Anesthesia Physical  Anesthesia Plan  ASA: III  Anesthesia Plan: Spinal   Post-op Pain Management:    Induction: Intravenous  PONV Risk Score and Plan:   Airway Management Planned: Nasal Cannula  Additional Equipment:   Intra-op Plan:   Post-operative Plan:   Informed Consent: I have reviewed the patients History and Physical, chart,  labs and discussed the procedure including the risks, benefits and alternatives for the proposed anesthesia with the patient or authorized representative who has indicated his/her understanding and acceptance.       Plan Discussed with: CRNA and Surgeon  Anesthesia Plan Comments:         Anesthesia Quick Evaluation

## 2020-03-20 NOTE — Progress Notes (Signed)
Same day rounding progress note  Patient seen and examined while in the ED.  Waiting for hip surgery and floor bed.  Complains of minimal pain at fracture site  1. Right greater trochanter fracture  - Presents with right hip pain after a fall at home and is found to have fracture involving greater trochanter  - Orthopedic surgery planning ORIF for right intertrochanteric hip fracture - CT chest is concerning for possible pneumonia and preoperative PNA has been shown to significantly increase postoperative morbidity and mortality -we will start IV Levaquin for now, check procalcitonin.  She does have a low-grade fever so I am covering her with empiric Levaquin. - Continue pain-control, keep NPO pending surgical consultation, hold ASA and ARB in anticipation of possible surgery    2. Pneumonia  - Patient was noted by family to have general weakness and mild cough 7/29, found to have mild leukocytosis (could be reactive to fracture), no tachypnea or hypoxia, and CXR suggestive of possible LLL pneumonia  - CT chest concerning for pneumonia - She is not septic (only 1 SIRS criteria - WBC 12.4k) on admission  - Check sputum culture, strep pneumo and legionella antigens, procalcitonin, and CT chest; Levaquin IV for now  3. CKD IIIb  - SCr is 1.65 in ED, up from 1.4 in June 2021  - Renally-dose medications, monitor    4. CAD  - There is mild troponin elevation in ED without any anginal complaint - Hold ASA preoperatively, continue statin    5. Hypertension  - Continue Norvasc and hydralazine as tolerated, hold losartan preoperatively    Consult palliative care for goals of care conversation  Time spent: 25 minutes  Possible discharge in 3 to 4 days depending on postoperative course and disposition plan, goals of care conversation.

## 2020-03-20 NOTE — Op Note (Signed)
03/20/2020  3:32 PM  PATIENT:  Hayley Maynard  86 y.o. female  PRE-OPERATIVE DIAGNOSIS:  Right Hip Fracture nondisplaced intertrochanteric  POST-OPERATIVE DIAGNOSIS:  right hip fracture nondisplaced intertrochanteric  PROCEDURE:  Procedure(s): INTRAMEDULLARY (IM) NAIL INTERTROCHANTRIC (Right)  SURGEON: Leitha Schuller, MD  ASSISTANTS: None  ANESTHESIA:   spinal  EBL:  Total I/O In: 510 [I.V.:500; IV Piggyback:10] Out: 350 [Urine:300; Blood:50]  BLOOD ADMINISTERED:none  DRAINS: none   LOCAL MEDICATIONS USED:  NONE  SPECIMEN:  No Specimen  DISPOSITION OF SPECIMEN:  N/A  COUNTS:  YES  TOURNIQUET:  * No tourniquets in log *  IMPLANTS: Biomet affixes 9 x 380 right affixes rod with 90 mm lag screw 44 mm distal interlocking screw  DICTATION: .Dragon Dictation patient was brought to the operating room and after adequate spinal anesthesia was obtained patient was placed on the fracture table with the left leg in the well-leg holder right foot in the traction boot with no traction required.  C arm was brought in and good visualization of the proximal femur was obtained.  After prepping and draping using a barrier drape method appropriate patient identification and timeout procedures were completed.  An incision was made at the tip of the trochanter and a guidewire inserted into the canal followed by proximal reaming.  A guidewire was then placed down the canal and measurement made off of this.  11 mm reaming gave some chatter and so the 9 x 380 rod was inserted to the appropriate depth.  A small lateral incision was made and a guidewire inserted into a near center center position measured drilled and then the 90 mm screws are inserted with the proximal locking device engaged.  The insertion handle was removed with permanent C-arm views obtained.  The distal interlocking screw was placed through the oblique hole using standard technique getting perfect circles making a small incision drilling  measuring and placing a 44 mm distal interlocking screw.  The wounds were then irrigated and closed with #1 Vicryl for the deep fascia 2-0 Vicryl subcutaneously and skin staples honeycomb dressings applied  PLAN OF CARE: Continue as inpatient  PATIENT DISPOSITION:  PACU - hemodynamically stable.

## 2020-03-20 NOTE — ED Notes (Signed)
Attempted to get consent from daughter via phone but daughter not answering at this time.

## 2020-03-20 NOTE — Plan of Care (Signed)
  Problem: Clinical Measurements: Goal: Will remain free from infection Outcome: Progressing Goal: Cardiovascular complication will be avoided Outcome: Progressing   Problem: Coping: Goal: Level of anxiety will decrease Outcome: Progressing   

## 2020-03-20 NOTE — H&P (Signed)
History and Physical    Hayley Maynard MVH:[id number] DOB: July 09, 1922 DOA: 03/20/2020  PCP: Jerl MinaHedrick, James, MD   Patient coming from: Home   Chief Complaint: Fall with right hip pain   HPI: Hayley Maynard is a 86 y.o. female with medical history significant for hypertension, chronic kidney disease stage IIIb, and coronary artery disease, now presenting to the emergency department for evaluation of falls and right hip pain.  Patient is usually able to ambulate with a walker, but seemed to be generally weak yesterday per report of her daughter, had her knees "give out" per report of the patient, leading to a fall.  This happened twice.  Patient denies hitting her head or losing consciousness but has been experiencing right hip pain when she attempts to bear weight.  Patient denies any shortness of breath, chest pain, chills, or cough, but her daughter notes that there has been a mild cough since yesterday though no apparent dyspnea.  ED Course: Upon arrival to the ED, patient is found to be afebrile, saturating mid 90s on room air, normal respirations, and stable blood pressure.  EKG features a sinus rhythm with PVCs and LAD.  Noncontrast head CT is negative for acute intracranial abnormality.  Radiographs of the pelvis are concerning for avulsion of the right greater trochanter and CT of the right hip demonstrates comminuted impacted and mildly displaced fracture of the right greater trochanter.  Chest x-ray is concerning for possible left lower lobe pneumonia and CT chest has been ordered to further evaluate for this.  Chemistry panel features a creatinine 1.65 and CBC is notable for a leukocytosis to 12,400.  Troponin is mildly elevated.  Orthopedic surgery was consulted by the ED physician and recommended MRI hip.  Patient was given 500 cc normal saline and morphine in the ED.  Review of Systems:  All other systems reviewed and apart from HPI, are negative.  Past Medical History:  Diagnosis Date  .  Arthritis    feet  . CKD (chronic kidney disease), stage III   . Coronary artery disease   . Glaucoma   . Hyperlipidemia   . Hypertension   . LVH (left ventricular hypertrophy)   . Wears dentures    full upper and lower    Past Surgical History:  Procedure Laterality Date  . ABDOMINAL HYSTERECTOMY    . CATARACT EXTRACTION W/ INTRAOCULAR LENS  IMPLANT, BILATERAL    . CORONARY ARTERY BYPASS GRAFT     over 10 yrs ago (per pt)  . PHOTOCOAGULATION WITH LASER Right 09/26/2018   Procedure: PHOTOCOAGULATION WITH LASER;  Surgeon: Lockie MolaBrasington, Chadwick, MD;  [facility];  Service: Ophthalmology;  Laterality: Right;  laser settings: 2000mW, 31.3% duty cycle, 120 seconds    Social History:   reports that she has been smoking cigarettes. She has never used smokeless tobacco. She reports previous alcohol use. No history on file for drug use.  Allergies  Allergen Reactions  . Propoxyphene Other (See Comments)  . Telmisartan-Hctz Other (See Comments)    History reviewed. No pertinent family history.   Prior to Admission medications   Medication Sig Start Date End Date Taking? Authorizing Provider  amLODipine (NORVASC) 10 MG tablet Take 1 tablet by mouth daily. 12/23/19  Yes [provider]  brimonidine (ALPHAGAN) 0.2 % ophthalmic solution USE 1 DROP IN BOTH EYES 2 TIMES A DAY 06/05/18  Yes [provider]  dorzolamide-timolol (COSOPT) 22.3-6.8 MG/ML ophthalmic solution Place 1 drop into both eyes 2 (two)  times daily. 04/29/19  Yes [provider]  febuxostat (ULORIC) 40 MG tablet Take 40 mg by mouth daily.   Yes [provider]  hydrALAZINE (APRESOLINE) 50 MG tablet Take 1 tablet by mouth 2 (two) times daily. 09/02/19  Yes [provider]  losartan (COZAAR) 50 MG tablet TAKE 1 TABLET DAILY 04/01/19  Yes [provider]  simvastatin (ZOCOR) 20 MG tablet Take 20 mg by mouth daily.   Yes [provider]  aspirin 81 MG  tablet Take 81 mg by mouth daily. Patient not taking: Reported on 03/20/2020    [provider]  cyanocobalamin 1000 MCG tablet Take 1,000 mcg by mouth daily. Patient not taking: Reported on 03/20/2020    [provider]  TOPROL XL 25 MG 24 hr tablet  06/09/18   [provider]    Physical Exam: Vitals:   03/20/20 0248 03/20/20 0256 03/20/20 0330 03/20/20 0400  BP:      Pulse: 88 87  73  Resp: 17 22 20 13   Temp:      TempSrc:      SpO2: 95% 94%  93%  Weight:      Height:        Constitutional: NAD, calm  Eyes: PERTLA, lids and conjunctivae normal ENMT: Mucous membranes are moist. Posterior pharynx clear of any exudate or lesions.   Neck: normal, supple, no masses, no thyromegaly Respiratory:  no wheezing, no crackles. No accessory muscle use.  Cardiovascular: S1 & S2 heard, regular rate and rhythm. No extremity edema.  Abdomen: No distension, no tenderness, soft. Bowel sounds active.  Musculoskeletal: Tender right hip, neurovascularly intact distally. No joint deformity upper and lower extremities.   Skin: no significant rashes, lesions, ulcers. Warm, dry, well-perfused. Neurologic: CN 2-12 grossly intact. Sensation intact. Moving all extremities.  Psychiatric: Alert and oriented to person, place, and situation. Very pleasant and cooperative.    Labs and Imaging on Admission: I have personally reviewed following labs and imaging studies  CBC: Recent Labs  Lab 03/19/20 2110  WBC 12.4*  HGB 11.5*  HCT 34.8*  MCV 94.1  PLT 206   Basic Metabolic Panel: Recent Labs  Lab 03/19/20 2110  NA 141  K 4.2  CL 101  CO2 23  GLUCOSE 107*  BUN 32*  CREATININE 1.65*  CALCIUM 9.1   GFR: Estimated Creatinine Clearance: 14.3 mL/min (A) (by C-G formula based on SCr of 1.65 mg/dL (H)). Liver Function Tests: No results for input(s): AST, ALT, ALKPHOS, BILITOT, PROT, ALBUMIN in the last 168 hours. No results for input(s): LIPASE, AMYLASE in the last 168  hours. No results for input(s): AMMONIA in the last 168 hours. Coagulation Profile: No results for input(s): INR, PROTIME in the last 168 hours. Cardiac Enzymes: No results for input(s): CKTOTAL, CKMB, CKMBINDEX, TROPONINI in the last 168 hours. BNP (last 3 results) No results for input(s): PROBNP in the last 8760 hours. HbA1C: No results for input(s): HGBA1C in the last 72 hours. CBG: No results for input(s): GLUCAP in the last 168 hours. Lipid Profile: No results for input(s): CHOL, HDL, LDLCALC, TRIG, CHOLHDL, LDLDIRECT in the last 72 hours. Thyroid Function Tests: No results for input(s): TSH, T4TOTAL, FREET4, T3FREE, THYROIDAB in the last 72 hours. Anemia Panel: No results for input(s): VITAMINB12, FOLATE, FERRITIN, TIBC, IRON, RETICCTPCT in the last 72 hours. Urine analysis: No results found for: COLORURINE, APPEARANCEUR, LABSPEC, PHURINE, GLUCOSEU, HGBUR, BILIRUBINUR, KETONESUR, PROTEINUR, UROBILINOGEN, NITRITE, LEUKOCYTESUR Sepsis Labs: @LABRCNTIP (procalcitonin:4,lacticidven:4) )No results found for this  or any previous visit (from the past 240 hour(s)).   Radiological Exams on Admission: DG Chest 2 View  Result Date: 03/19/2020 CLINICAL DATA:  86 year old with cough and congestion. EXAM: CHEST - 2 VIEW COMPARISON:  None. FINDINGS: Post median sternotomy. Upper normal heart size. Aortic atherosclerosis and tortuosity. Increased AP diameter of the thorax. Patchy basilar opacity likely localizing to the left lower lobe. Trace pleural effusions. Mild interstitial coarsening. No pneumothorax. Bones are under mineralized. IMPRESSION: 1. Patchy left lower lobe opacity suspicious for pneumonia in the setting of cough. 2. Trace pleural effusions. 3. Post CABG.  Aortic atherosclerosis and tortuosity. Aortic Atherosclerosis (ICD10-I70.0). Electronically Signed   By: Narda Rutherford M.D.   On: 03/19/2020 21:27   CT Head Wo Contrast  Result Date: 03/19/2020 CLINICAL DATA:  Mental status  change, fall EXAM: CT HEAD WITHOUT CONTRAST TECHNIQUE: Contiguous axial images were obtained from the base of the skull through the vertex without intravenous contrast. COMPARISON:  None. FINDINGS: Brain: No evidence of acute territorial infarction, hemorrhage, hydrocephalus,extra-axial collection or mass lesion/mass effect. There is dilatation the ventricles and sulci consistent with age-related atrophy. Low-attenuation changes in the deep white matter consistent with small vessel ischemia. Vascular: No hyperdense vessel or unexpected calcification. Skull: The skull is intact. No fracture or focal lesion identified. Sinuses/Orbits: The visualized paranasal sinuses and mastoid air cells are clear. The orbits and globes intact. Other: None IMPRESSION: No acute intracranial abnormality. Findings consistent with age related atrophy and chronic small vessel ischemia Electronically Signed   By: Jonna Clark M.D.   On: 03/19/2020 23:38   CT Hip Right Wo Contrast  Result Date: 03/20/2020 CLINICAL DATA:  Fall with pain EXAM: CT OF THE RIGHT HIP WITHOUT CONTRAST TECHNIQUE: Multidetector CT imaging of the right hip was performed according to the standard protocol. Multiplanar CT image reconstructions were also generated. COMPARISON:  Radiograph same day FINDINGS: Bones/Joint/Cartilage There is a comminuted impacted mildly displaced fracture involving the greater trochanter. No other fractures are seen. There is moderate right hip osteoarthritis with superior joint space loss and marginal osteophyte formation. No large hip joint effusion. Ligaments Suboptimally assessed by CT. Muscles and Tendons The muscles surrounding the hip are intact. The tendons appear to be grossly intact. A small fatty containing lesion is seen with anterior rectus femoris musculature, likely lipoma. Soft tissues Subcutaneous edema seen over the lateral aspect of the hip. There is scattered colonic diverticula. Scattered dense vascular  calcifications are noted. IMPRESSION: Comminuted impacted mildly displaced fracture of the greater trochanter. Electronically Signed   By: Jonna Clark M.D.   On: 03/20/2020 03:28   DG Hip Unilat W or Wo Pelvis 2-3 Views Right  Result Date: 03/19/2020 CLINICAL DATA:  Several falls today with hip pain, initial encounter EXAM: DG HIP (WITH OR WITHOUT PELVIS) 2-3V RIGHT COMPARISON:  None. FINDINGS: Pelvic ring is intact. No dislocation is noted. Some lucency is noted in the region of the right greater trochanter which may represent an undisplaced greater trochanter fracture. No definitive right femoral neck fracture is seen. No soft tissue abnormality is seen. IMPRESSION: Changes suspicious for avulsion fracture from the right greater trochanter. CT can be performed as clinically indicated. Electronically Signed   By: Alcide Clever M.D.   On: 03/19/2020 23:51    EKG: Independently reviewed. Sinus rhythm, PVCs, LAD.   Assessment/Plan   1. Right greater trochanter fracture  - Presents with right hip pain after a fall at home and is found to have fracture involving greater  trochanter  - Orthopedic surgery consulting and much appreciated, requesting MRI hip  - Based on the available data, Ms. Gilcrest presents an estimated 2% risk of of perioperative MI or cardiac arrest but note that CT chest is pending for possible pneumonia and preoperative PNA has been shown to significantly increase postoperative morbidity and mortality  - Continue pain-control, keep NPO pending surgical consultation, hold ASA and ARB in anticipation of possible surgery    2. Pneumonia  - Patient was noted by family to have general weakness and mild cough 7/29, found to have mild leukocytosis (could be reactive to fracture), no tachypnea or hypoxia, and CXR suggestive of possible LLL pneumonia  - CT chest is pending for further characterization of LLL opacity  - She is not septic (only 1 SIRS criteria - WBC 12.4k) on admission  -  Check sputum culture, strep pneumo and legionella antigens, procalcitonin, and CT chest; start Rocephin and azithromycin for now    3. CKD IIIb  - SCr is 1.65 in ED, up from 1.4 in June 2021  - Renally-dose medications, monitor    4. CAD  - There is mild troponin elevation in ED without any anginal complaint - Hold ASA preoperatively, continue statin    5. Hypertension  - Continue Norvasc and hydralazine as tolerated, hold losartan preoperatively     DVT prophylaxis: SCDs Code Status: Full  Family Communication: Daughter updated at bedside Disposition Plan:  Patient is from: home  Anticipated d/c is to: TBD Anticipated d/c date is: 03/23/20 Patient currently: Pending ortho consultation, eval of possibly PNA with CT chest  Consults called: Orthopedic surgery consulted by ED physician  Admission status: Inpatient     Briscoe Deutscher, MD [facility]  03/20/2020, 5:07 AM

## 2020-03-20 NOTE — ED Notes (Signed)
Patient reports 2 mechanical falls yesterday. Patient reports he knees just gave out. Patient denies LOC, dizziness, weakness, and head injury.   Patient's daughter reports that patient had been complaining of right hip pain post fall; patient currently denies pain.   Patient's daughter reports patient has been intermittently altered post fall; patient AO X 4.

## 2020-03-20 NOTE — Progress Notes (Signed)
Palliative:  Consult received and chart reviewed. Pt in OR - per chart patient is A&O. Will defer GOC conversation until patient is able to participate.   Gerlean Ren, DNP, AGNP-C Palliative Medicine Team Team Phone # [phone]  Pager # [phone]  NO CHARGE

## 2020-03-20 NOTE — Progress Notes (Signed)
Initial Nutrition Assessment  DOCUMENTATION CODES:   Not applicable  INTERVENTION:   Ensure Enlive po BID, each supplement provides 350 kcal and 20 grams of protein  Magic cup TID with meals, each supplement provides 290 kcal and 9 grams of protein  MVI daily  NUTRITION DIAGNOSIS:   Increased nutrient needs related to hip fracture as evidenced by increased estimated needs.  GOAL:   Patient will meet greater than or equal to 90% of their needs  MONITOR:   PO intake, Supplement acceptance, Labs, Weight trends, Skin, I & O's  REASON FOR ASSESSMENT:   Consult Hip fracture protocol  ASSESSMENT:   86 y.o. female with medical history significant for hypertension, chronic kidney disease stage IIIb, and coronary artery disease who is admitted with hip fracture s/p fall   Unable to see patient today as pt went directly from the ED to surgery. Pt NPO for ORIF today. Suspect pt with decreased appetite and oral intake at baseline r/t advanced age. Per chart, pt wears full dentures. Per chart review, pt appears weight stable pta; pt's UBW appears to be ~105-110lbs. RD will add supplements and MVI to support post-op healing. RD will obtain nutrition related history and exam at follow-up. Pt is at high risk for malnutrition.   Medications reviewed and include: cefazolin, NaCl @10ml /hr, morphine   Labs reviewed: BUN 32(H), creat 1.65(H) Wbc- 12.4(H)  NUTRITION - FOCUSED PHYSICAL EXAM: Unable to perform at this time   Diet Order:   Diet Order            Diet NPO time specified Except for: Ice Chips, Sips with Meds  Diet effective now                EDUCATION NEEDS:   Not appropriate for education at this time  Skin:  Skin Assessment: Reviewed RN Assessment  Last BM:  pta  Height:   Ht Readings from Last 1 Encounters:  03/19/20 5' (1.524 m)    Weight:   Wt Readings from Last 1 Encounters:  03/19/20 47.2 kg    Ideal Body Weight:  45.45 kg  BMI:  Body mass index  is 20.32 kg/m.  Estimated Nutritional Needs:   Kcal:  1200-1400kcal/day  Protein:  60-70g/day  Fluid:  1.2-1.4L/day  03/21/20 MS, RD, LDN Please refer to [facility] for RD and/or RD on-call/weekend/after hours pager

## 2020-03-20 NOTE — Progress Notes (Signed)
PHARMACY NOTE:  ANTIMICROBIAL RENAL DOSAGE ADJUSTMENT  Current antimicrobial regimen includes a mismatch between antimicrobial dosage and estimated renal function.  As per policy approved by the Pharmacy & Therapeutics and Medical Executive Committees, the antimicrobial dosage will be adjusted accordingly.  Current antimicrobial dosage:  Levofloxacin 250mg  IV q48h  Indication: CAP  Renal Function:  Estimated Creatinine Clearance: 14.3 mL/min (A) (by C-G formula based on SCr of 1.65 mg/dL (H)).   Antimicrobial dosage has been changed to:  Levofloxacin 750mg  IV x1 followed by Levofloxacin 500mg  IV q48h   Additional comments:   Thank you for allowing pharmacy to be a part of this patient's care.  , PharmD, BCPS Clinical Pharmacist 03/20/2020 3:17 PM

## 2020-03-20 NOTE — Transfer of Care (Signed)
Immediate Anesthesia Transfer of Care Note  Patient: Hayley Maynard  Procedure(s) Performed: INTRAMEDULLARY (IM) NAIL INTERTROCHANTRIC (Right )  Patient Location: PACU  Anesthesia Type:General  Level of Consciousness: drowsy  Airway & Oxygen Therapy: Patient Spontanous Breathing and Patient connected to face mask oxygen  Post-op Assessment: Report given to RN and Post -op Vital signs reviewed and stable  Post vital signs: Reviewed and stable  Last Vitals:  Vitals Value Taken Time  BP 150/87 03/20/20 1533  Temp    Pulse 78 03/20/20 1536  Resp 20 03/20/20 1536  SpO2 100 % 03/20/20 1536  Vitals shown include unvalidated device data.  Last Pain:  Vitals:   03/20/20 1327  TempSrc: Temporal  PainSc: Asleep         Complications: No complications documented.

## 2020-03-21 ENCOUNTER — Encounter: Payer: Self-pay | Admitting: Orthopedic Surgery

## 2020-03-21 LAB — CBC
HCT: 26.1 % — ABNORMAL LOW (ref 36.0–46.0)
Hemoglobin: 8.2 g/dL — ABNORMAL LOW (ref 12.0–15.0)
MCH: 31.1 pg (ref 26.0–34.0)
MCHC: 31.4 g/dL (ref 30.0–36.0)
MCV: 98.9 fL (ref 80.0–100.0)
Platelets: 139 10*3/uL — ABNORMAL LOW (ref 150–400)
RBC: 2.64 MIL/uL — ABNORMAL LOW (ref 3.87–5.11)
RDW: 14 % (ref 11.5–15.5)
WBC: 8.8 10*3/uL (ref 4.0–10.5)
nRBC: 0 % (ref 0.0–0.2)

## 2020-03-21 LAB — BASIC METABOLIC PANEL
Anion gap: 10 (ref 5–15)
BUN: 37 mg/dL — ABNORMAL HIGH (ref 8–23)
CO2: 27 mmol/L (ref 22–32)
Calcium: 7.8 mg/dL — ABNORMAL LOW (ref 8.9–10.3)
Chloride: 104 mmol/L (ref 98–111)
Creatinine, Ser: 1.49 mg/dL — ABNORMAL HIGH (ref 0.44–1.00)
GFR calc Af Amer: 34 mL/min — ABNORMAL LOW (ref >60)
GFR calc non Af Amer: 29 mL/min — ABNORMAL LOW (ref >60)
Glucose, Bld: 104 mg/dL — ABNORMAL HIGH (ref 70–99)
Potassium: 4.3 mmol/L (ref 3.5–5.1)
Sodium: 141 mmol/L (ref 135–145)

## 2020-03-21 LAB — PROCALCITONIN: Procalcitonin: 0.97 ng/mL

## 2020-03-21 MED ORDER — SODIUM CHLORIDE 0.9 % IV SOLN
500.0000 mg | INTRAVENOUS | Status: AC
  Administered 2020-03-21 – 2020-03-23 (×3): 500 mg via INTRAVENOUS
  Filled 2020-03-21 (×3): qty 500

## 2020-03-21 MED ORDER — TRAMADOL HCL 50 MG PO TABS: 50.0000 mg | ORAL_TABLET | Freq: Four times a day (QID) | ORAL | 0 refills | Status: DC | PRN

## 2020-03-21 MED ORDER — ENOXAPARIN SODIUM 40 MG/0.4ML ~~LOC~~ SOLN: 40.0000 mg | SUBCUTANEOUS | 0 refills | Status: DC

## 2020-03-21 MED ORDER — SODIUM CHLORIDE 0.9 % IV SOLN
1.0000 g | INTRAVENOUS | Status: DC
  Administered 2020-03-21 – 2020-03-22 (×2): 1 g via INTRAVENOUS
  Filled 2020-03-21 (×2): qty 1
  Filled 2020-03-21: qty 10

## 2020-03-21 MED ORDER — HYDROCODONE-ACETAMINOPHEN 5-325 MG PO TABS: 1.0000 | ORAL_TABLET | Freq: Four times a day (QID) | ORAL | 0 refills | Status: DC | PRN

## 2020-03-21 NOTE — Anesthesia Postprocedure Evaluation (Signed)
Anesthesia Post Note  Patient: Hayley Maynard  Procedure(s) Performed: INTRAMEDULLARY (IM) NAIL INTERTROCHANTRIC (Right )  Patient location during evaluation: Nursing Unit Anesthesia Type: Spinal Level of consciousness: awake and alert Pain management: pain level controlled Respiratory status: spontaneous breathing and nonlabored ventilation Cardiovascular status: stable Postop Assessment: no apparent nausea or vomiting, spinal receding and no headache Anesthetic complications: no   No complications documented.   Last Vitals:  Vitals:   03/21/20 0349 03/21/20 0817  BP: (!) 102/52 (!) 110/47  Pulse: 61 65  Resp: 18 17  Temp: 37.1 C 37.2 C  SpO2: 100% 99%    Last Pain:  Vitals:   03/21/20 0817  TempSrc: Oral  PainSc:                  Karleen Hampshire

## 2020-03-21 NOTE — Progress Notes (Signed)
PT Cancellation Note  Patient Details Name: Hayley Maynard MRN: [id number] DOB: 09/22/1921   Cancelled Treatment:    Reason Eval/Treat Not Completed: Other (comment). Patient needed to use the bed pan and was not available.   [address], [place] DPT 03/21/2020, 3:35 PM

## 2020-03-21 NOTE — Progress Notes (Signed)
Physical Therapy Evaluation Patient Details Name: Hayley Maynard MRN: [id number] DOB: 08-10-1922 Today's Date: 03/21/2020   History of Present Illness  Per MD note: Hayley Maynard is a 86 y.o. female with medical history significant for hypertension, chronic kidney disease stage IIIb, and coronary artery disease, now presenting to the emergency department for evaluation of falls and right hip pain.  Patient is usually able to ambulate with a walker, but seemed to be generally weak yesterday per report of her daughter, had her knees "give out" per report of the patient, leading to a fall.  This happened twice.  Patient denies hitting her head or losing consciousness but has been experiencing right hip pain when she attempts to bear weight.  Patient denies any shortness of breath, chest pain, chills, or cough, but her daughter notes that there has been a mild cough since yesterday though no apparent dyspnea  Clinical Impression  Patient agrees to PT evaluation. Pt reports no pain, but faces indicate pain during mobility training.  Pt lives alone .Marland Kitchen Pt ambulated with RW prior to this hospital admission. Pt has 1/5 strength RLE hip and knee and is max A for bed mobility, max A for transfers sit to stand with RW, and unable to  ambulate  Pt has fair static sitting balance and poor static/dynamic standing balance and needs UE support with RW. Pt will continue to benefit from skilled PT to improve mobility and strength.    Follow Up Recommendations SNF    Equipment Recommendations  Rolling walker with 5" wheels    Recommendations for Other Services       Precautions / Restrictions Restrictions Weight Bearing Restrictions: Yes      Mobility  Bed Mobility Overal bed mobility: Needs Assistance Bed Mobility: Sidelying to Sit;Supine to Sit   Sidelying to sit: Max assist Supine to sit: Max assist     General bed mobility comments: needs vc for safety  Transfers Overall transfer level: Needs  assistance Equipment used: Rolling walker (2 wheeled) Transfers: Sit to/from Stand Sit to Stand: Max assist         General transfer comment: needs cues for weight shifting fwd over her feet  Ambulation/Gait Ambulation/Gait assistance:  (unable)              Stairs            Wheelchair Mobility    Modified Rankin (Stroke Patients Only)       Balance Overall balance assessment: Needs assistance Sitting-balance support: Bilateral upper extremity supported;Feet supported Sitting balance-Leahy Scale: Fair     Standing balance support: Bilateral upper extremity supported Standing balance-Leahy Scale: Poor                               Pertinent Vitals/Pain Pain Assessment: Faces Faces Pain Scale: Hurts little more Pain Location:  (right leg) Pain Descriptors / Indicators: Aching Pain Intervention(s): Limited activity within patient's tolerance;Monitored during session    Home Living Family/patient expects to be discharged to:: Private residence Living Arrangements: Alone Available Help at Discharge: Personal care attendant           Home Equipment: Dan Humphreys - 2 wheels      Prior Function Level of Independence: Independent with assistive device(s)               Hand Dominance   Dominant Hand: Right    Extremity/Trunk Assessment   Upper Extremity Assessment Upper Extremity  Assessment: Defer to OT evaluation    Lower Extremity Assessment Lower Extremity Assessment: Generalized weakness;RLE deficits/detail RLE Deficits / Details: 1/5 hip flex/abd       Communication   Communication: No difficulties  Cognition Arousal/Alertness: Awake/alert                                            General Comments      Exercises     Assessment/Plan    PT Assessment Patient needs continued PT services  PT Problem List Decreased strength;Decreased activity tolerance;Decreased mobility       PT Treatment  Interventions Gait training;Functional mobility training;Therapeutic activities;Therapeutic exercise;Balance training    PT Goals (Current goals can be found in the Care Plan section)  Acute Rehab PT Goals Patient Stated Goal: to walk PT Goal Formulation: With patient Time For Goal Achievement: 04/04/20 Potential to Achieve Goals: Fair    Frequency BID   Barriers to discharge Decreased caregiver support      Co-evaluation               AM-PAC PT "6 Clicks" Mobility  Outcome Measure Help needed turning from your back to your side while in a flat bed without using bedrails?: Total Help needed moving from lying on your back to sitting on the side of a flat bed without using bedrails?: Total Help needed moving to and from a bed to a chair (including a wheelchair)?: Total Help needed standing up from a chair using your arms (e.g., wheelchair or bedside chair)?: Total Help needed to walk in hospital room?: Total Help needed climbing 3-5 steps with a railing? : Total 6 Click Score: 6    End of Session Equipment Utilized During Treatment: Gait belt;Oxygen Activity Tolerance: No increased pain;Patient limited by fatigue Patient left: in bed;with call bell/phone within reach Nurse Communication: Mobility status PT Visit Diagnosis: Unsteadiness on feet (R26.81);Muscle weakness (generalized) (M62.81);Difficulty in walking, not elsewhere classified (R26.2)    Time: 1020-1050 PT Time Calculation (min) (ACUTE ONLY): 30 min   Charges:   PT Evaluation $PT Eval Low Complexity: 1 Low PT Treatments $Therapeutic Activity: 23-37 mins          Ezekiel Ina, PT DPT 03/21/2020, 11:55 AM

## 2020-03-21 NOTE — Discharge Instructions (Signed)
INSTRUCTIONS AFTER Surgery  o Remove items at home which could result in a fall. This includes throw rugs or furniture in walking pathways o ICE to the affected joint every three hours while awake for 30 minutes at a time, for at least the first 3-5 days, and then as needed for pain and swelling.  Continue to use ice for pain and swelling. You may notice swelling that will progress down to the foot and ankle.  This is normal after surgery.  Elevate your leg when you are not up walking on it.   o Continue to use the breathing machine you got in the hospital (incentive spirometer) which will help keep your temperature down.  It is common for your temperature to cycle up and down following surgery, especially at night when you are not up moving around and exerting yourself.  The breathing machine keeps your lungs expanded and your temperature down.   DIET:  As you were doing prior to hospitalization, we recommend a well-balanced diet.  DRESSING / WOUND CARE / SHOWERING  Dressing change as needed.  Keep the wound dry.  Staples will be removed in 2 weeks at [facility] clinic  ACTIVITY  o Increase activity slowly as tolerated, but follow the weight bearing instructions below.   o No driving for 6 weeks or until further direction given by your physician.  You cannot drive while taking narcotics.  o No lifting or carrying greater than 10 lbs. until further directed by your surgeon. o Avoid periods of inactivity such as sitting longer than an hour when not asleep. This helps prevent blood clots.  o You may return to work once you are authorized by your doctor.     WEIGHT BEARING  Weightbearing as tolerated on the right   EXERCISES Gait training and ambulation with physical therapy.  Using a walker for ambulation  CONSTIPATION  Constipation is defined medically as fewer than three stools per week and severe constipation as less than one stool per week.  Even if you have a regular bowel pattern at  home, your normal regimen is likely to be disrupted due to multiple reasons following surgery.  Combination of anesthesia, postoperative narcotics, change in appetite and fluid intake all can affect your bowels.   YOU MUST use at least one of the following options; they are listed in order of increasing strength to get the job done.  They are all available over the counter, and you may need to use some, POSSIBLY even all of these options:    Drink plenty of fluids (prune juice may be helpful) and high fiber foods Colace 100 mg by mouth twice a day  Senokot for constipation as directed and as needed Dulcolax (bisacodyl), take with full glass of water  Miralax (polyethylene glycol) once or twice a day as needed.  If you have tried all these things and are unable to have a bowel movement in the first 3-4 days after surgery call either your surgeon or your primary doctor.    If you experience loose stools or diarrhea, hold the medications until you stool forms back up.  If your symptoms do not get better within 1 week or if they get worse, check with your doctor.  If you experience "the worst abdominal pain ever" or develop nausea or vomiting, please contact the office immediately for further recommendations for treatment.   ITCHING:  If you experience itching with your medications, try taking only a single pain pill, or even half  a pain pill at a time.  You can also use Benadryl over the counter for itching or also to help with sleep.   TED HOSE STOCKINGS:  Use stockings on both legs until for at least 2 weeks or as directed by physician office. They may be removed at night for sleeping.  MEDICATIONS:  See your medication summary on the "After Visit Summary" that nursing will review with you.  You may have some home medications which will be placed on hold until you complete the course of blood thinner medication.  It is important for you to complete the blood thinner medication as  prescribed.  PRECAUTIONS:  If you experience chest pain or shortness of breath - call 911 immediately for transfer to the hospital emergency department.   If you develop a fever greater that 101 F, purulent drainage from wound, increased redness or drainage from wound, foul odor from the wound/dressing, or calf pain - CONTACT YOUR SURGEON.                                                   FOLLOW-UP APPOINTMENTS:  If you do not already have a post-op appointment, please call the office for an appointment to be seen by your surgeon.  Guidelines for how soon to be seen are listed in your "After Visit Summary", but are typically between 1-4 weeks after surgery.  OTHER INSTRUCTIONS:     MAKE SURE YOU:  . Understand these instructions.  . Get help right away if you are not doing well or get worse.    Thank you for letting us be a part of your medical care team.  It is a privilege we respect greatly.  We hope these instructions will help you stay on track for a fast and full recovery!

## 2020-03-21 NOTE — Progress Notes (Signed)
  Subjective: 1 Day Post-Op Procedure(s) (LRB): INTRAMEDULLARY (IM) NAIL INTERTROCHANTRIC (Right) Patient reports pain as mild.   Patient is well, and has had no acute complaints or problems Plan is to go home versus Rehab after hospital stay. Negative for chest pain and shortness of breath Fever: no Gastrointestinal: Negative for nausea and vomiting  Objective: Vital signs in last 24 hours: Temp:  [98.1 F (36.7 C)-99.2 F (37.3 C)] 98.8 F (37.1 C) (07/31 0349) Pulse Rate:  [61-84] 61 (07/31 0349) Resp:  [13-22] 18 (07/31 0349) BP: (100-177)/(52-87) 102/52 (07/31 0349) SpO2:  [85 %-100 %] 100 % (07/31 0349) Weight:  [47.2 kg] 47.2 kg (07/30 1807)  Intake/Output from previous day:  Intake/Output Summary (Last 24 hours) at 03/21/2020 0710 Last data filed at 03/21/2020 0515 Gross per 24 hour  Intake 1516.05 ml  Output 900 ml  Net 616.05 ml    Intake/Output this shift: No intake/output data recorded.  Labs: Recent Labs    03/19/20 2110 03/20/20 1734 03/21/20 0406  HGB 11.5* 10.0* 8.2*   Recent Labs    03/20/20 1734 03/21/20 0406  WBC 12.6* 8.8  RBC 3.21* 2.64*  HCT 30.9* 26.1*  PLT 172 139*   Recent Labs    03/19/20 2110 03/19/20 2110 03/20/20 1734 03/21/20 0406  NA 141  --   --  141  K 4.2  --   --  4.3  CL 101  --   --  104  CO2 23  --   --  27  BUN 32*  --   --  37*  CREATININE 1.65*   < > 1.65* 1.49*  GLUCOSE 107*  --   --  104*  CALCIUM 9.1  --   --  7.8*   < > = values in this interval not displayed.   No results for input(s): LABPT, INR in the last 72 hours.   EXAM General - Patient is Alert and Oriented Extremity - Neurovascular intact Sensation intact distally Compartment soft Dressing/Incision - clean, dry, no drainage Motor Function - intact, moving foot and toes well on exam.   Past Medical History:  Diagnosis Date  . Arthritis    feet  . CKD (chronic kidney disease), stage III   . Coronary artery disease   . Glaucoma   .  Hyperlipidemia   . Hypertension   . LVH (left ventricular hypertrophy)   . Wears dentures    full upper and lower    Assessment/Plan: 1 Day Post-Op Procedure(s) (LRB): INTRAMEDULLARY (IM) NAIL INTERTROCHANTRIC (Right) Principal Problem:   Displaced fracture of greater trochanter of right femur, initial encounter for closed fracture [facility]) Active Problems:   Benign essential hypertension   CAD (coronary artery disease)   CKD (chronic kidney disease) stage 3, GFR 30-59 ml/min   CAP (community acquired pneumonia)  Estimated body mass index is 20.32 kg/m as calculated from the following:   Height as of this encounter: 5' (1.524 m).   Weight as of this encounter: 47.2 kg. Advance diet Up with therapy D/C IV fluids  Follow-up at [facility] clinic orthopedics in 2 weeks for staple removal  DVT Prophylaxis - Lovenox, Foot Pumps and TED hose Weight-Bearing as tolerated to right leg  Dedra Skeens, PA-C Orthopaedic Surgery 03/21/2020, 7:10 AM

## 2020-03-21 NOTE — Progress Notes (Signed)
PROGRESS NOTE    Hayley Maynard  VHQ:[id number] DOB: 05/12/1922 DOA: 03/20/2020 PCP: Jerl Mina, MD    Brief Narrative:  Hayley Maynard is a 86 y.o. female with medical history significant for hypertension, chronic kidney disease stage IIIb, and coronary artery disease, now presenting to the emergency department for evaluation of falls and right hip pain.  Patient is usually able to ambulate with a walker, but seemed to be generally weak yesterday per report of her daughter, had her knees "give out" per report of the patient, leading to a fall.  This happened twice.  Patient denies hitting her head or losing consciousness but has been experiencing right hip pain when she attempts to bear weight.  Patient denies any shortness of breath, chest pain, chills, or cough, but her daughter notes that there has been a mild cough since yesterday though no apparent dyspnea.  ED Course: Upon arrival to the ED, patient is found to be afebrile, saturating mid 90s on room air, normal respirations, and stable blood pressure.  EKG features a sinus rhythm with PVCs and LAD.  Noncontrast head CT is negative for acute intracranial abnormality.  Radiographs of the pelvis are concerning for avulsion of the right greater trochanter and CT of the right hip demonstrates comminuted impacted and mildly displaced fracture of the right greater trochanter.  Chest x-ray is concerning for possible left lower lobe pneumonia and CT chest has been ordered to further evaluate for this.  Chemistry panel features a creatinine 1.65 and CBC is notable for a leukocytosis to 12,400.  Troponin is mildly elevated.  Orthopedic surgery was consulted by the ED physician and recommended MRI hip.  Patient was given 500 cc normal saline and morphine in the ED.  Assessment & Plan:   Principal Problem:   Displaced fracture of greater trochanter of right femur, initial encounter for closed fracture [facility]) Active Problems:   Benign essential  hypertension   CAD (coronary artery disease)   CKD (chronic kidney disease) stage 3, GFR 30-59 ml/min   CAP (community acquired pneumonia)   #1 right greater trochanteric fracture status post mechanical fall at home.  Pain controlled with Tylenol DVT prophylaxis with Lovenox.  Seen by PT recommends SNF. Patient is having loose bowel movements hold stool softeners. Ultram Norco Tylenol for pain control DC morphine Case manager consulted for SNF.  #2 community-acquired pneumonia on Rocephin and azithromycin which probably could be causing the diarrhea.  Add probiotics.   CT of the chest-patchy bibasilar atelectasis with areas of suspected consolidation/pneumonia in the anterolateral posteromedial left base regions.  No pleural effusion.  #3 CKD stage IIIb stable creatinine 1.49 today.  #4 history of essential hypertension blood pressure 110/47 hold Norvasc and hydralazine and reassess in a.m.  #5 history of CAD/hyperlipidemia on Zocor    Nutrition Problem: Increased nutrient needs Etiology: hip fracture     Signs/Symptoms: estimated needs    Interventions: Ensure Enlive (each supplement provides 350kcal and 20 grams of protein), MVI  Estimated body mass index is 20.32 kg/m as calculated from the following:   Height as of this encounter: 5' (1.524 m).   Weight as of this encounter: 47.2 kg.  DVT prophylaxis: Lovenox Code Status: Full code Family Communication: None at bedside  disposition Plan:  Status is: Inpatient  Dispo: The patient is from: Home              Anticipated d/c is to: SNF  Anticipated d/c date is: 2 days              Patient currently is not medically stable to d/c.  Status post hip fracture surgery awaiting SNF   Consultants:   Ortho  Procedures: Right hip ORIF 03/20/2020 Antimicrobials:  Anti-infectives (From admission, onward)   Start     Dose/Rate Route Frequency Ordered Stop   03/22/20 1600  levofloxacin (LEVAQUIN) IVPB 500 mg   Status:  Discontinued       "Followed by" Linked Group Details   500 mg 100 mL/hr over 60 Minutes Intravenous Every 48 hours 03/20/20 1515 03/21/20 1451   03/21/20 1500  cefTRIAXone (ROCEPHIN) 1 g in sodium chloride 0.9 % 100 mL IVPB     Discontinue     1 g 200 mL/hr over 30 Minutes Intravenous Every 24 hours 03/21/20 1452     03/21/20 1500  azithromycin (ZITHROMAX) 500 mg in sodium chloride 0.9 % 250 mL IVPB     Discontinue     500 mg 250 mL/hr over 60 Minutes Intravenous Every 24 hours 03/21/20 1452     03/20/20 2100  ceFAZolin (ANCEF) IVPB 1 g/50 mL premix        1 g 100 mL/hr over 30 Minutes Intravenous Every 6 hours 03/20/20 1716 03/21/20 1045   03/20/20 1800  cefTRIAXone (ROCEPHIN) 2 g in sodium chloride 0.9 % 100 mL IVPB  Status:  Discontinued        2 g 200 mL/hr over 30 Minutes Intravenous Every 24 hours 03/20/20 1716 03/20/20 1731   03/20/20 1730  azithromycin (ZITHROMAX) 500 mg in sodium chloride 0.9 % 250 mL IVPB  Status:  Discontinued        500 mg 250 mL/hr over 60 Minutes Intravenous Every 24 hours 03/20/20 1716 03/20/20 1731   03/20/20 1600  levofloxacin (LEVAQUIN) IVPB 750 mg       "Followed by" Linked Group Details   750 mg 100 mL/hr over 90 Minutes Intravenous  Once 03/20/20 1515 03/20/20 2235   03/20/20 1500  azithromycin (ZITHROMAX) 250 mg in dextrose 5 % 125 mL IVPB  Status:  Discontinued        250 mg 125 mL/hr over 60 Minutes Intravenous Every 24 hours 03/20/20 1453 03/20/20 1454   03/20/20 1500  Levofloxacin (LEVAQUIN) IVPB 250 mg  Status:  Discontinued        250 mg 50 mL/hr over 60 Minutes Intravenous Every 48 hours 03/20/20 1454 03/20/20 1514   03/20/20 1400  ceFAZolin (ANCEF) IVPB 1 g/50 mL premix        1 g 100 mL/hr over 30 Minutes Intravenous  Once 03/20/20 0801 03/20/20 1441      Subjective:  Patient resting in bed she is awake alert on 1 L of oxygen denies any shortness of breath chest pain or cough.   Objective: Vitals:   03/20/20 1807  03/20/20 2327 03/21/20 0349 03/21/20 0817  BP:  (!) 100/53 (!) 102/52 (!) 110/47  Pulse:  68 61 65  Resp:  17 18 17   Temp:  98.1 F (36.7 C) 98.8 F (37.1 C) 99 F (37.2 C)  TempSrc:  Oral Oral Oral  SpO2: 95% 100% 100% 99%  Weight: 47.2 kg     Height: 5' (1.524 m)       Intake/Output Summary (Last 24 hours) at 03/21/2020 1441 Last data filed at 03/21/2020 0515 Gross per 24 hour  Intake 1506.05 ml  Output 900 ml  Net 606.05 ml  Filed Weights   03/19/20 2100 03/20/20 1807  Weight: 47.2 kg 47.2 kg    Examination:  General exam: Appears calm and comfortable  Respiratory system: Few scattered rhonchi on the left more than right to auscultation. Respiratory effort normal. Cardiovascular system: S1 & S2 heard, RRR. No JVD, murmurs, rubs, gallops or clicks. No pedal edema. Gastrointestinal system: Abdomen is nondistended, soft and nontender. No organomegaly or masses felt. Normal bowel sounds heard. Central nervous system: Alert and oriented. No focal neurological deficits. Extremities: Symmetric 5 x 5 power. Skin: No rashes, lesions or ulcers Psychiatry: Judgement and insight appear normal. Mood & affect appropriate.     Data Reviewed: I have personally reviewed following labs and imaging studies  CBC: Recent Labs  Lab 03/19/20 2110 03/20/20 1734 03/21/20 0406  WBC 12.4* 12.6* 8.8  HGB 11.5* 10.0* 8.2*  HCT 34.8* 30.9* 26.1*  MCV 94.1 96.3 98.9  PLT 206 172 139*   Basic Metabolic Panel: Recent Labs  Lab 03/19/20 2110 03/20/20 1734 03/21/20 0406  NA 141  --  141  K 4.2  --  4.3  CL 101  --  104  CO2 23  --  27  GLUCOSE 107*  --  104*  BUN 32*  --  37*  CREATININE 1.65* 1.65* 1.49*  CALCIUM 9.1  --  7.8*   GFR: Estimated Creatinine Clearance: 15.9 mL/min (A) (by C-G formula based on SCr of 1.49 mg/dL (H)). Liver Function Tests: No results for input(s): AST, ALT, ALKPHOS, BILITOT, PROT, ALBUMIN in the last 168 hours. No results for input(s): LIPASE,  AMYLASE in the last 168 hours. No results for input(s): AMMONIA in the last 168 hours. Coagulation Profile: No results for input(s): INR, PROTIME in the last 168 hours. Cardiac Enzymes: No results for input(s): CKTOTAL, CKMB, CKMBINDEX, TROPONINI in the last 168 hours. BNP (last 3 results) No results for input(s): PROBNP in the last 8760 hours. HbA1C: No results for input(s): HGBA1C in the last 72 hours. CBG: Recent Labs  Lab 03/20/20 2329  GLUCAP 96   Lipid Profile: No results for input(s): CHOL, HDL, LDLCALC, TRIG, CHOLHDL, LDLDIRECT in the last 72 hours. Thyroid Function Tests: No results for input(s): TSH, T4TOTAL, FREET4, T3FREE, THYROIDAB in the last 72 hours. Anemia Panel: No results for input(s): VITAMINB12, FOLATE, FERRITIN, TIBC, IRON, RETICCTPCT in the last 72 hours. Sepsis Labs: Recent Labs  Lab 03/20/20 0555 03/21/20 0406  PROCALCITON 1.57 0.97    Recent Results (from the past 240 hour(s))  SARS Coronavirus 2 by RT PCR (hospital order, performed in [facility] hospital lab) Nasopharyngeal Nasopharyngeal Swab     Status: None   Collection Time: 03/20/20  4:18 AM   Specimen: Nasopharyngeal Swab  Result Value Ref Range Status   SARS Coronavirus 2 NEGATIVE NEGATIVE Final    Comment: (NOTE) SARS-CoV-2 target nucleic acids are NOT DETECTED.  The SARS-CoV-2 RNA is generally detectable in upper and lower respiratory specimens during the acute phase of infection. The lowest concentration of SARS-CoV-2 viral copies this assay can detect is 250 copies / mL. A negative result does not preclude SARS-CoV-2 infection and should not be used as the sole basis for treatment or other patient management decisions.  A negative result may occur with improper specimen collection / handling, submission of specimen other than nasopharyngeal swab, presence of viral mutation(s) within the areas targeted by this assay, and inadequate number of viral copies (<250 copies / mL). A  negative result must be combined with clinical observations,  patient history, and epidemiological information.  Fact Sheet for Patients:   BoilerBrush.com.cy  Fact Sheet for Healthcare Providers: https://pope.com/  This test is not yet approved or  cleared by the [place] FDA and has been authorized for detection and/or diagnosis of SARS-CoV-2 by FDA under an Emergency Use Authorization (EUA).  This EUA will remain in effect (meaning this test can be used) for the duration of the COVID-19 declaration under Section 564(b)(1) of the Act, 21 U.S.C. section 360bbb-3(b)(1), unless the authorization is terminated or revoked sooner.  Performed at [facility], [address], [place], [place] [address]          Radiology Studies: DG Chest 2 View  Result Date: 03/19/2020 CLINICAL DATA:  86 year old with cough and congestion. EXAM: CHEST - 2 VIEW COMPARISON:  None. FINDINGS: Post median sternotomy. Upper normal heart size. Aortic atherosclerosis and tortuosity. Increased AP diameter of the thorax. Patchy basilar opacity likely localizing to the left lower lobe. Trace pleural effusions. Mild interstitial coarsening. No pneumothorax. Bones are under mineralized. IMPRESSION: 1. Patchy left lower lobe opacity suspicious for pneumonia in the setting of cough. 2. Trace pleural effusions. 3. Post CABG.  Aortic atherosclerosis and tortuosity. Aortic Atherosclerosis (ICD10-I70.0). Electronically Signed   By: Narda Rutherford M.D.   On: 03/19/2020 21:27   CT Head Wo Contrast  Result Date: 03/19/2020 CLINICAL DATA:  Mental status change, fall EXAM: CT HEAD WITHOUT CONTRAST TECHNIQUE: Contiguous axial images were obtained from the base of the skull through the vertex without intravenous contrast. COMPARISON:  None. FINDINGS: Brain: No evidence of acute territorial infarction, hemorrhage, hydrocephalus,extra-axial collection or mass lesion/mass  effect. There is dilatation the ventricles and sulci consistent with age-related atrophy. Low-attenuation changes in the deep white matter consistent with small vessel ischemia. Vascular: No hyperdense vessel or unexpected calcification. Skull: The skull is intact. No fracture or focal lesion identified. Sinuses/Orbits: The visualized paranasal sinuses and mastoid air cells are clear. The orbits and globes intact. Other: None IMPRESSION: No acute intracranial abnormality. Findings consistent with age related atrophy and chronic small vessel ischemia Electronically Signed   By: Jonna Clark M.D.   On: 03/19/2020 23:38   CT Chest Wo Contrast  Result Date: 03/20/2020 CLINICAL DATA:  Pneumonia EXAM: CT CHEST WITHOUT CONTRAST TECHNIQUE: Multidetector CT imaging of the chest was performed following the standard protocol without IV contrast. COMPARISON:  Chest radiograph March 19, 2020 FINDINGS: Cardiovascular: There is aortic atherosclerosis. The aorta appears overall prominent. The measured diameter in the aortic arch region is 3.5 cm. There is extensive aortic atherosclerosis. There are multiple foci of calcification in visualized great vessels. There are multiple foci of native coronary artery calcification. Patient is status post coronary artery bypass grafting. There is calcification in the mitral annulus. There is no pericardial effusion or pericardial thickening. Mediastinum/Nodes: There are subcentimeter thyroid nodular lesions. There is no dominant thyroid mass. There are occasional subcentimeter mediastinal lymph nodes which do not meet size criteria for pathologic significance. No appreciable esophageal lesions. Lungs/Pleura: There is underlying centrilobular and paraseptal emphysematous change. There is atelectatic change in the lung bases. There is mild consolidation in the posteromedial left base. A second focus of apparent consolidation is noted in the anterolateral left base. No appreciable pleural  effusions. Upper Abdomen: There is extensive upper abdominal vascular atherosclerosis. There is a calcification in the region of the gallbladder fossa, a likely gallstone, incompletely visualized. Visualized upper abdominal structures otherwise appear unremarkable. Musculoskeletal: Patient is status post median sternotomy. There is  degenerative change throughout the thoracic spine. There are no blastic or lytic bone lesions. No chest wall lesions evident. IMPRESSION: 1. Underlying emphysematous change. Patchy bibasilar atelectasis with areas of suspected consolidation/pneumonia in the anterolateral and posteromedial left base regions. No similar consolidation elsewhere. No appreciable pleural effusion. 2. Extensive aortic atherosclerosis. Prominence of the thoracic aorta with measured diameter in the aortic arch of 3.5 cm. Recommend annual imaging followup by CTA or MRA if overall clinical condition so warrants. This recommendation follows 2010 ACCF/AHA/AATS/ACR/ASA/SCA/SCAI/SIR/STS/SVM Guidelines for the Diagnosis and Management of Patients with Thoracic Aortic Disease. Circulation.2010; 121: K481-E563. Aortic aneurysm NOS (ICD10-I71.9) 3.  Status post coronary artery bypass grafting. 4. Questionable gallstone in right upper quadrant, incompletely visualized. Aortic Atherosclerosis (ICD10-I70.0) and Emphysema (ICD10-J43.9). Electronically Signed   By: Bretta Bang III M.D.   On: 03/20/2020 05:28   CT Hip Right Wo Contrast  Result Date: 03/20/2020 CLINICAL DATA:  Fall with pain EXAM: CT OF THE RIGHT HIP WITHOUT CONTRAST TECHNIQUE: Multidetector CT imaging of the right hip was performed according to the standard protocol. Multiplanar CT image reconstructions were also generated. COMPARISON:  Radiograph same day FINDINGS: Bones/Joint/Cartilage There is a comminuted impacted mildly displaced fracture involving the greater trochanter. No other fractures are seen. There is moderate right hip osteoarthritis with  superior joint space loss and marginal osteophyte formation. No large hip joint effusion. Ligaments Suboptimally assessed by CT. Muscles and Tendons The muscles surrounding the hip are intact. The tendons appear to be grossly intact. A small fatty containing lesion is seen with anterior rectus femoris musculature, likely lipoma. Soft tissues Subcutaneous edema seen over the lateral aspect of the hip. There is scattered colonic diverticula. Scattered dense vascular calcifications are noted. IMPRESSION: Comminuted impacted mildly displaced fracture of the greater trochanter. Electronically Signed   By: Jonna Clark M.D.   On: 03/20/2020 03:28   MR HIP RIGHT WO CONTRAST  Result Date: 03/20/2020 CLINICAL DATA:  Evaluate right hip fracture EXAM: MR OF THE RIGHT HIP WITHOUT CONTRAST TECHNIQUE: Multiplanar, multisequence MR imaging was performed. No intravenous contrast was administered. COMPARISON:  CT 03/20/2020.  X-ray 03/19/2020 FINDINGS: Bones: Redemonstration of a comminuted fracture of the right greater trochanter. 1.5 cm fragment is medially displaced along the posterior aspect of the femoral neck (series 11, image 13). Nondisplaced fracture lines involve the intertrochanteric region without extension into the lesser trochanter (series 4, images 14-15). Prominent surrounding marrow edema. Fracture lines do not involve the femoral neck or femoral head. No additional fracture. No femoral head avascular necrosis. Pelvic bony ring intact. No suspicious osseous lesion. Articular cartilage and labrum Articular cartilage: No cartilage defect. No subchondral marrow signal changes. Labrum: Mild superior labral degeneration with partial-thickness fraying. Joint or bursal effusion Joint effusion:  Small hip joint effusion. Bursae: No evidence of bursitis. Muscles and tendons Muscles and tendons: Gluteus medius tendinosis (series 11, images 7-9). The gluteus medius tendon remains intact inserting onto a nondisplaced  fracture component of the greater tuberosity. Gluteus minimus tendon appears intact. The hamstring, iliopsoas, rectus femoris, and adductor tendons appear intact without tear or significant tendinosis. Periarticular intramuscular edema most pronounced within the adductor compartment, likely reactive/posttraumatic. Overlying the proximal vastus lateralis muscle is a homogeneously T1 hyperintense well-defined ovoid mass with edge follows fat signal on all sequences measuring 8.1 x 1.2 x 4.1 cm (series 11, image 26; series 4, image 19). Other findings Miscellaneous: Extensive colonic diverticulosis. Degenerative disc disease within the lower lumbar spine. Nonspecific diffuse subcutaneous edema. IMPRESSION: 1. Comminuted mildly displaced  fracture of the right greater trochanter, as described above. 2. Gluteus medius tendinosis. 3. 8.1 x 1.2 x 4.1 cm simple lipoma overlying the proximal vastus lateralis muscle. Electronically Signed   By: Duanne GuessNicholas  Plundo D.O.   On: 03/20/2020 08:06   DG HIP OPERATIVE UNILAT W OR W/O PELVIS RIGHT  Result Date: 03/20/2020 CLINICAL DATA:  Right IM nail fracture EXAM: OPERATIVE right HIP (WITH PELVIS IF PERFORMED) 3 VIEWS TECHNIQUE: Fluoroscopic spot image(s) were submitted for interpretation post-operatively. COMPARISON:  03/19/2020 FINDINGS: Three low resolution intraoperative spot views of the right hip. Total fluoroscopy time was 42 seconds. The images demonstrate intramedullary rod and distal screw fixation of the right femur. There is anatomic alignment. IMPRESSION: Intraoperative fluoroscopic assistance provided during surgical fixation of right femur Electronically Signed   By: Jasmine PangKim  Fujinaga M.D.   On: 03/20/2020 15:47   DG Hip Unilat W or Wo Pelvis 2-3 Views Right  Result Date: 03/19/2020 CLINICAL DATA:  Several falls today with hip pain, initial encounter EXAM: DG HIP (WITH OR WITHOUT PELVIS) 2-3V RIGHT COMPARISON:  None. FINDINGS: Pelvic ring is intact. No dislocation is  noted. Some lucency is noted in the region of the right greater trochanter which may represent an undisplaced greater trochanter fracture. No definitive right femoral neck fracture is seen. No soft tissue abnormality is seen. IMPRESSION: Changes suspicious for avulsion fracture from the right greater trochanter. CT can be performed as clinically indicated. Electronically Signed   By: Alcide CleverMark  Lukens M.D.   On: 03/19/2020 23:51        Scheduled Meds: . amLODipine  10 mg Oral Daily  . brimonidine  1 drop Both Eyes BID  . Chlorhexidine Gluconate Cloth  6 each Topical Daily  . docusate sodium  100 mg Oral BID  . dorzolamide-timolol  1 drop Both Eyes BID  . enoxaparin (LOVENOX) injection  30 mg Subcutaneous Q24H  . febuxostat  40 mg Oral Daily  . feeding supplement (ENSURE ENLIVE)  237 mL Oral BID BM  . hydrALAZINE  50 mg Oral BID  . multivitamin with minerals  1 tablet Oral Daily  . simvastatin  20 mg Oral Daily   Continuous Infusions: . sodium chloride    . lactated ringers 10 mL/hr at 03/20/20 1353  . [START ON 03/22/2020] levofloxacin (LEVAQUIN) IV       LOS: 1 day     Alwyn RenElizabeth G Derionna Salvador, MD  03/21/2020, 2:41 PM

## 2020-03-21 NOTE — Evaluation (Signed)
Occupational Therapy Evaluation Patient Details Name: Hayley Maynard MRN: [id number] DOB: March 04, 1922 Today's Date: 03/21/2020    History of Present Illness Per MD note: Hayley Maynard is a 86 y.o. female with medical history significant for hypertension, chronic kidney disease stage IIIb, and coronary artery disease, now presenting to the emergency department for evaluation of falls and right hip pain.  Patient is usually able to ambulate with a walker, but seemed to be generally weak yesterday per report of her daughter, had her knees "give out" per report of the patient, leading to a fall.  This happened twice.  Patient denies hitting her head or losing consciousness but has been experiencing right hip pain when she attempts to bear weight.  Patient denies any shortness of breath, chest pain, chills, or cough, but her daughter notes that there has been a mild cough since yesterday though no apparent dyspnea   Clinical Impression   [name] presents with limited endurance, generalized weakness, and RLE pain/ROM/strength deficits that impacts her ability to safely and independently engage in functional tasks.  Prior to admission, pt was mod I in BADLs using RW.  She lives alone and has friends/PCA that can provide assistance for transportation and errands.  Pt was slightly lethargic and required min-mod verbal cues to follow one-step commands, but was grossly oriented.   Currently, pt requires setup-min A for seated upper body ADLs but requires significant amount of assist for ADLs involving BLE.  Pt declined any OOB mobility, stating fatigue and increased pain with movement.  Suspect pt requires max assist for lower body dressing.  Per PT report, pt requires max assist for bed mobility and sit to stand transfers.   [name] will continue to benefit from skilled OT services in acute setting to address functional strengthening, endurance, safety, and independence in ADLs.  Recommend SNF upon discharge.     Follow Up Recommendations  SNF    Equipment Recommendations  3 in 1 bedside commode;Tub/shower bench;Other (comment) (defer to next venue of care)    Recommendations for Other Services       Precautions / Restrictions Precautions Precautions: Fall Restrictions Weight Bearing Restrictions: Yes RLE Weight Bearing: Weight bearing as tolerated      Mobility Bed Mobility Overal bed mobility: Needs Assistance Bed Mobility: Sidelying to Sit;Supine to Sit   Sidelying to sit: Max assist Supine to sit: Max assist     General bed mobility comments: max assist per PT report  Transfers Overall transfer level: Needs assistance Equipment used: Rolling walker (2 wheeled) Transfers: Sit to/from Stand Sit to Stand: Max assist         General transfer comment: max assist per PT report    Balance Overall balance assessment: Needs assistance Sitting-balance support: Bilateral upper extremity supported;Feet supported Sitting balance-Leahy Scale: Fair     Standing balance support: Bilateral upper extremity supported Standing balance-Leahy Scale: Poor                             ADL either performed or assessed with clinical judgement   ADL Overall ADL's : Needs assistance/impaired                                       General ADL Comments: Pt generally requires setup-min A for seated UB ADLs including feeding, grooming, upper body bathing and dressing.  Unable to assess OOB mobility 2/2 pt fatigue, but suspect pt requires max assist for lower body dressing and bathing.  Per PT report, pt requires max assist for all functional transfers.     Vision Baseline Vision/History: Wears glasses Wears Glasses: Reading only Patient Visual Report: No change from baseline       Perception     Praxis      Pertinent Vitals/Pain Pain Assessment: No/denies pain Faces Pain Scale: Hurts little more Pain Location:  (right leg) Pain Descriptors / Indicators:  Aching Pain Intervention(s): Limited activity within patient's tolerance;Monitored during session     Hand Dominance Right   Extremity/Trunk Assessment Upper Extremity Assessment Upper Extremity Assessment: Generalized weakness;RUE deficits/detail RUE Deficits / Details: Pt unable to actively flex shoulder more than ~10 degrees.  Pt denies pain, able to tolerate PROM full range.  Adequate grip strength, full AROM of other joints.  Unable to tolerate resistance in any plane of movement.   Lower Extremity Assessment Lower Extremity Assessment: Generalized weakness;Defer to PT evaluation RLE Deficits / Details: 1/5 hip flex/abd       Communication Communication Communication: No difficulties   Cognition Arousal/Alertness: Awake/alert Behavior During Therapy: WFL for tasks assessed/performed Overall Cognitive Status: Within Functional Limits for tasks assessed                                 General Comments: May benefit from collateral from caregiver/family to determine baseline, but pt grossly oriented throughout.  Pt required min-mod verbal cues to follow one step commands.   General Comments       Exercises Other Exercises Other Exercises: provided education re: OT role and plan of care, fall and safety precautions, self care, importance of OOB mobility for improved endurance and independence in ADLs   Shoulder Instructions      Home Living Family/patient expects to be discharged to:: Private residence Living Arrangements: Alone Available Help at Discharge: Personal care attendant;Available PRN/intermittently (pt has a few friends who check in, provide assistance as needed) Type of Home: House Home Access: Stairs to enter [employer] of Steps: "a few" Entrance Stairs-Rails: Right;Left Home Layout: One level     Bathroom Shower/Tub: Chief Strategy Officer: Handicapped height     Home Equipment: Environmental consultant - 2 wheels;Shower seat;Grab  bars - tub/shower          Prior Functioning/Environment Level of Independence: Needs assistance  Gait / Transfers Assistance Needed: Pt is mod I in household mobility using RW ADL's / Homemaking Assistance Needed: Pt is mod I in BADLs using RW.  She receives assistance from friends for transportation and community mobility.  Pt is independent in medication management.   Comments: Pt denies fall history prior to this week.  This week she had a couple falls in which her "knees gave out"        OT Problem List: Decreased strength;Decreased range of motion;Decreased activity tolerance;Impaired balance (sitting and/or standing);Decreased knowledge of use of DME or AE;Pain      OT Treatment/Interventions: Self-care/ADL training;Therapeutic exercise;DME and/or AE instruction;Therapeutic activities;Patient/family education;Balance training    OT Goals(Current goals can be found in the care plan section) Acute Rehab OT Goals Patient Stated Goal: to walk OT Goal Formulation: With patient Time For Goal Achievement: 04/04/20 Potential to Achieve Goals: Good  OT Frequency: Min 2X/week   Barriers to D/C: Decreased caregiver support  Co-evaluation              AM-PAC OT "6 Clicks" Daily Activity     Outcome Measure Help from another person eating meals?: None Help from another person taking care of personal grooming?: A Little Help from another person toileting, which includes using toliet, bedpan, or urinal?: A Lot Help from another person bathing (including washing, rinsing, drying)?: A Lot Help from another person to put on and taking off regular upper body clothing?: A Little Help from another person to put on and taking off regular lower body clothing?: A Lot 6 Click Score: 16   End of Session    Activity Tolerance: Patient limited by fatigue Patient left: in bed;with call bell/phone within reach;with bed alarm set  OT Visit Diagnosis: Repeated falls (R29.6);Other  abnormalities of gait and mobility (R26.89);Pain Pain - Right/Left: Right Pain - part of body: Hip                Time: 4562-5638 OT Time Calculation (min): 13 min Charges:  OT General Charges $OT Visit: 1 Visit OT Evaluation $OT Eval Moderate Complexity: 1 Mod  Kathyrn Drown Roshanda Balazs, OTR/L 03/21/20, 12:37 PM

## 2020-03-22 LAB — CBC
HCT: 26.3 % — ABNORMAL LOW (ref 36.0–46.0)
Hemoglobin: 8.3 g/dL — ABNORMAL LOW (ref 12.0–15.0)
MCH: 30.5 pg (ref 26.0–34.0)
MCHC: 31.6 g/dL (ref 30.0–36.0)
MCV: 96.7 fL (ref 80.0–100.0)
Platelets: 156 10*3/uL (ref 150–400)
RBC: 2.72 MIL/uL — ABNORMAL LOW (ref 3.87–5.11)
RDW: 14 % (ref 11.5–15.5)
WBC: 8.2 10*3/uL (ref 4.0–10.5)
nRBC: 0 % (ref 0.0–0.2)

## 2020-03-22 LAB — BASIC METABOLIC PANEL
Anion gap: 6 (ref 5–15)
BUN: 38 mg/dL — ABNORMAL HIGH (ref 8–23)
CO2: 31 mmol/L (ref 22–32)
Calcium: 8.1 mg/dL — ABNORMAL LOW (ref 8.9–10.3)
Chloride: 105 mmol/L (ref 98–111)
Creatinine, Ser: 1.52 mg/dL — ABNORMAL HIGH (ref 0.44–1.00)
GFR calc Af Amer: 33 mL/min — ABNORMAL LOW (ref >60)
GFR calc non Af Amer: 29 mL/min — ABNORMAL LOW (ref >60)
Glucose, Bld: 105 mg/dL — ABNORMAL HIGH (ref 70–99)
Potassium: 4 mmol/L (ref 3.5–5.1)
Sodium: 142 mmol/L (ref 135–145)

## 2020-03-22 NOTE — Progress Notes (Signed)
  Subjective: 2 Days Post-Op Procedure(s) (LRB): INTRAMEDULLARY (IM) NAIL INTERTROCHANTRIC (Right) Patient reports pain as mild.   Patient is well, and has had no acute complaints or problems Plan is to go to rehab after hospital stay. Negative for chest pain and shortness of breath Fever: no Gastrointestinal: Negative for nausea and vomiting  Objective: Vital signs in last 24 hours: Temp:  [98 F (36.7 C)-99 F (37.2 C)] 98 F (36.7 C) (08/01 0021) Pulse Rate:  [61-67] 61 (08/01 0021) Resp:  [15-17] 16 (08/01 0021) BP: (106-110)/(47-62) 106/54 (08/01 0021) SpO2:  [95 %-100 %] 100 % (08/01 0021)  Intake/Output from previous day:  Intake/Output Summary (Last 24 hours) at 03/22/2020 0724 Last data filed at 03/22/2020 0400 Gross per 24 hour  Intake 684.18 ml  Output --  Net 684.18 ml    Intake/Output this shift: No intake/output data recorded.  Labs: Recent Labs    03/19/20 2110 03/20/20 1734 03/21/20 0406 03/22/20 0522  HGB 11.5* 10.0* 8.2* 8.3*   Recent Labs    03/21/20 0406 03/22/20 0522  WBC 8.8 8.2  RBC 2.64* 2.72*  HCT 26.1* 26.3*  PLT 139* 156   Recent Labs    03/21/20 0406 03/22/20 0522  NA 141 142  K 4.3 4.0  CL 104 105  CO2 27 31  BUN 37* 38*  CREATININE 1.49* 1.52*  GLUCOSE 104* 105*  CALCIUM 7.8* 8.1*   No results for input(s): LABPT, INR in the last 72 hours.   EXAM General - Patient is Alert and Oriented Extremity - Neurovascular intact Sensation intact distally Compartment soft Dressing/Incision - clean, dry, no drainage Motor Function - intact, moving foot and toes well on exam.   Past Medical History:  Diagnosis Date  . Arthritis    feet  . CKD (chronic kidney disease), stage III   . Coronary artery disease   . Glaucoma   . Hyperlipidemia   . Hypertension   . LVH (left ventricular hypertrophy)   . Wears dentures    full upper and lower    Assessment/Plan: 2 Days Post-Op Procedure(s) (LRB): INTRAMEDULLARY (IM) NAIL  INTERTROCHANTRIC (Right) Principal Problem:   Displaced fracture of greater trochanter of right femur, initial encounter for closed fracture [facility]) Active Problems:   Benign essential hypertension   CAD (coronary artery disease)   CKD (chronic kidney disease) stage 3, GFR 30-59 ml/min   CAP (community acquired pneumonia)  Estimated body mass index is 20.32 kg/m as calculated from the following:   Height as of this encounter: 5' (1.524 m).   Weight as of this encounter: 47.2 kg. Advance diet Up with therapy D/C IV fluids  Follow-up at [facility] clinic orthopedics in 2 weeks for staple removal  DVT Prophylaxis - Lovenox, Foot Pumps and TED hose Weight-Bearing as tolerated to right leg  Hayley Skeens, PA-C Orthopaedic Surgery 03/22/2020, 7:24 AM

## 2020-03-22 NOTE — Progress Notes (Signed)
PROGRESS NOTE    Hayley Maynard  PZW:[id number] DOB: December 09, 1921 DOA: 03/20/2020 PCP: Jerl Mina, MD    Brief Narrative:  Hayley Maynard is a 86 y.o. female with medical history significant for hypertension, chronic kidney disease stage IIIb, and coronary artery disease, now presenting to the emergency department for evaluation of falls and right hip pain.  Patient is usually able to ambulate with a walker, but seemed to be generally weak yesterday per report of her daughter, had her knees "give out" per report of the patient, leading to a fall.  This happened twice.  Patient denies hitting her head or losing consciousness but has been experiencing right hip pain when she attempts to bear weight.  Patient denies any shortness of breath, chest pain, chills, or cough, but her daughter notes that there has been a mild cough since yesterday though no apparent dyspnea.  ED Course: Upon arrival to the ED, patient is found to be afebrile, saturating mid 90s on room air, normal respirations, and stable blood pressure.  EKG features a sinus rhythm with PVCs and LAD.  Noncontrast head CT is negative for acute intracranial abnormality.  Radiographs of the pelvis are concerning for avulsion of the right greater trochanter and CT of the right hip demonstrates comminuted impacted and mildly displaced fracture of the right greater trochanter.  Chest x-ray is concerning for possible left lower lobe pneumonia and CT chest has been ordered to further evaluate for this.  Chemistry panel features a creatinine 1.65 and CBC is notable for a leukocytosis to 12,400.  Troponin is mildly elevated.  Orthopedic surgery was consulted by the ED physician and recommended MRI hip.  Patient was given 500 cc normal saline and morphine in the ED.  Assessment & Plan:   Principal Problem:   Displaced fracture of greater trochanter of right femur, initial encounter for closed fracture [facility]) Active Problems:   Benign essential  hypertension   CAD (coronary artery disease)   CKD (chronic kidney disease) stage 3, GFR 30-59 ml/min   CAP (community acquired pneumonia)   #1 Right greater trochanteric fracture status post mechanical fall at home.  Pain controlled with Tylenol DVT prophylaxis with Lovenox.  Seen by PT recommends SNF. Patient is having loose bowel movements hold stool softeners. Ultram Norco Tylenol for pain control DC morphine Case manager consulted for SNF.  #2 community-acquired pneumonia on Rocephin and azithromycin which probably could be causing the diarrhea.  Add probiotics.   CT of the chest-patchy bibasilar atelectasis with areas of suspected consolidation/pneumonia in the anterolateral posteromedial left base regions.  No pleural effusion. Saturating 100% on 2 L nasal cannula.  #3 CKD stage IIIb stable creatinine 1.49 today.  #4 history of essential hypertension-blood pressure still soft 106/54 continue to hold Norvasc and hydralazine.  #5 history of CAD/hyperlipidemia on Zocor  Nutrition Problem: Increased nutrient needs Etiology: hip fracture  Signs/Symptoms: estimated needs  Interventions: Ensure Enlive (each supplement provides 350kcal and 20 grams of protein), MVI  Estimated body mass index is 20.32 kg/m as calculated from the following:   Height as of this encounter: 5' (1.524 m).   Weight as of this encounter: 47.2 kg.  DVT prophylaxis: Lovenox Code Status: Full code Family Communication: None at bedside  disposition Plan:  Status is: Inpatient  Dispo: The patient is from: Home              Anticipated d/c is to: SNF  Anticipated d/c date is: 2 days              Patient currently is not medically stable to d/c.  Status post hip fracture surgery awaiting SNF   Consultants:   Ortho  Procedures: Right hip ORIF 03/20/2020 Antimicrobials:  Anti-infectives (From admission, onward)   Start     Dose/Rate Route Frequency Ordered Stop   03/22/20 1600   levofloxacin (LEVAQUIN) IVPB 500 mg  Status:  Discontinued       "Followed by" Linked Group Details   500 mg 100 mL/hr over 60 Minutes Intravenous Every 48 hours 03/20/20 1515 03/21/20 1451   03/21/20 1500  cefTRIAXone (ROCEPHIN) 1 g in sodium chloride 0.9 % 100 mL IVPB     Discontinue     1 g 200 mL/hr over 30 Minutes Intravenous Every 24 hours 03/21/20 1452     03/21/20 1500  azithromycin (ZITHROMAX) 500 mg in sodium chloride 0.9 % 250 mL IVPB     Discontinue     500 mg 250 mL/hr over 60 Minutes Intravenous Every 24 hours 03/21/20 1452     03/20/20 2100  ceFAZolin (ANCEF) IVPB 1 g/50 mL premix        1 g 100 mL/hr over 30 Minutes Intravenous Every 6 hours 03/20/20 1716 03/21/20 1045   03/20/20 1800  cefTRIAXone (ROCEPHIN) 2 g in sodium chloride 0.9 % 100 mL IVPB  Status:  Discontinued        2 g 200 mL/hr over 30 Minutes Intravenous Every 24 hours 03/20/20 1716 03/20/20 1731   03/20/20 1730  azithromycin (ZITHROMAX) 500 mg in sodium chloride 0.9 % 250 mL IVPB  Status:  Discontinued        500 mg 250 mL/hr over 60 Minutes Intravenous Every 24 hours 03/20/20 1716 03/20/20 1731   03/20/20 1600  levofloxacin (LEVAQUIN) IVPB 750 mg       "Followed by" Linked Group Details   750 mg 100 mL/hr over 90 Minutes Intravenous  Once 03/20/20 1515 03/20/20 2235   03/20/20 1500  azithromycin (ZITHROMAX) 250 mg in dextrose 5 % 125 mL IVPB  Status:  Discontinued        250 mg 125 mL/hr over 60 Minutes Intravenous Every 24 hours 03/20/20 1453 03/20/20 1454   03/20/20 1500  Levofloxacin (LEVAQUIN) IVPB 250 mg  Status:  Discontinued        250 mg 50 mL/hr over 60 Minutes Intravenous Every 48 hours 03/20/20 1454 03/20/20 1514   03/20/20 1400  ceFAZolin (ANCEF) IVPB 1 g/50 mL premix        1 g 100 mL/hr over 30 Minutes Intravenous  Once 03/20/20 0801 03/20/20 1441      Subjective: She is resting in bed awake alert in no acute distress denies any new complaints denies any chest pain or shortness of  breath.  She lives alone.  She has no family in the area. Objective: Vitals:   03/21/20 0349 03/21/20 0817 03/21/20 1535 03/22/20 0021  BP: (!) 102/52 (!) 110/47 (!) 109/62 (!) 106/54  Pulse: 61 65 67 61  Resp: 18 17 15 16   Temp: 98.8 F (37.1 C) 99 F (37.2 C) 98.4 F (36.9 C) 98 F (36.7 C)  TempSrc: Oral Oral Oral Oral  SpO2: 100% 99% 95% 100%  Weight:      Height:        Intake/Output Summary (Last 24 hours) at 03/22/2020 1141 Last data filed at 03/22/2020 0400 Gross per 24 hour  Intake 684.18  ml  Output --  Net 684.18 ml   Filed Weights   03/19/20 2100 03/20/20 1807  Weight: 47.2 kg 47.2 kg    Examination:  General exam: Appears calm and comfortable  Respiratory system: Few scattered rhonchi on the left more than right to auscultation. Respiratory effort normal. Cardiovascular system: S1 & S2 heard, RRR. No JVD, murmurs, rubs, gallops or clicks. No pedal edema. Gastrointestinal system: Abdomen is nondistended, soft and nontender. No organomegaly or masses felt. Normal bowel sounds heard. Central nervous system: Alert and oriented. No focal neurological deficits. Extremities:right hip covered with dressings Skin: No rashes, lesions or ulcers Psychiatry: Judgement and insight appear normal. Mood & affect appropriate.     Data Reviewed: I have personally reviewed following labs and imaging studies  CBC: Recent Labs  Lab 03/19/20 2110 03/20/20 1734 03/21/20 0406 03/22/20 0522  WBC 12.4* 12.6* 8.8 8.2  HGB 11.5* 10.0* 8.2* 8.3*  HCT 34.8* 30.9* 26.1* 26.3*  MCV 94.1 96.3 98.9 96.7  PLT 206 172 139* 156   Basic Metabolic Panel: Recent Labs  Lab 03/19/20 2110 03/20/20 1734 03/21/20 0406 03/22/20 0522  NA 141  --  141 142  K 4.2  --  4.3 4.0  CL 101  --  104 105  CO2 23  --  27 31  GLUCOSE 107*  --  104* 105*  BUN 32*  --  37* 38*  CREATININE 1.65* 1.65* 1.49* 1.52*  CALCIUM 9.1  --  7.8* 8.1*   GFR: Estimated Creatinine Clearance: 15.5 mL/min (A)  (by C-G formula based on SCr of 1.52 mg/dL (H)). Liver Function Tests: No results for input(s): AST, ALT, ALKPHOS, BILITOT, PROT, ALBUMIN in the last 168 hours. No results for input(s): LIPASE, AMYLASE in the last 168 hours. No results for input(s): AMMONIA in the last 168 hours. Coagulation Profile: No results for input(s): INR, PROTIME in the last 168 hours. Cardiac Enzymes: No results for input(s): CKTOTAL, CKMB, CKMBINDEX, TROPONINI in the last 168 hours. BNP (last 3 results) No results for input(s): PROBNP in the last 8760 hours. HbA1C: No results for input(s): HGBA1C in the last 72 hours. CBG: Recent Labs  Lab 03/20/20 2329  GLUCAP 96   Lipid Profile: No results for input(s): CHOL, HDL, LDLCALC, TRIG, CHOLHDL, LDLDIRECT in the last 72 hours. Thyroid Function Tests: No results for input(s): TSH, T4TOTAL, FREET4, T3FREE, THYROIDAB in the last 72 hours. Anemia Panel: No results for input(s): VITAMINB12, FOLATE, FERRITIN, TIBC, IRON, RETICCTPCT in the last 72 hours. Sepsis Labs: Recent Labs  Lab 03/20/20 0555 03/21/20 0406  PROCALCITON 1.57 0.97    Recent Results (from the past 240 hour(s))  SARS Coronavirus 2 by RT PCR (hospital order, performed in [facility] hospital lab) Nasopharyngeal Nasopharyngeal Swab     Status: None   Collection Time: 03/20/20  4:18 AM   Specimen: Nasopharyngeal Swab  Result Value Ref Range Status   SARS Coronavirus 2 NEGATIVE NEGATIVE Final    Comment: (NOTE) SARS-CoV-2 target nucleic acids are NOT DETECTED.  The SARS-CoV-2 RNA is generally detectable in upper and lower respiratory specimens during the acute phase of infection. The lowest concentration of SARS-CoV-2 viral copies this assay can detect is 250 copies / mL. A negative result does not preclude SARS-CoV-2 infection and should not be used as the sole basis for treatment or other patient management decisions.  A negative result may occur with improper specimen collection /  handling, submission of specimen other than nasopharyngeal swab, presence of viral mutation(s)  within the areas targeted by this assay, and inadequate number of viral copies (<250 copies / mL). A negative result must be combined with clinical observations, patient history, and epidemiological information.  Fact Sheet for Patients:   BoilerBrush.com.cy  Fact Sheet for Healthcare Providers: https://pope.com/  This test is not yet approved or  cleared by the [place] FDA and has been authorized for detection and/or diagnosis of SARS-CoV-2 by FDA under an Emergency Use Authorization (EUA).  This EUA will remain in effect (meaning this test can be used) for the duration of the COVID-19 declaration under Section 564(b)(1) of the Act, 21 U.S.C. section 360bbb-3(b)(1), unless the authorization is terminated or revoked sooner.  Performed at [facility], [address] [place], [place] [address]          Radiology Studies: DG HIP OPERATIVE UNILAT W OR W/O PELVIS RIGHT  Result Date: 03/20/2020 CLINICAL DATA:  Right IM nail fracture EXAM: OPERATIVE right HIP (WITH PELVIS IF PERFORMED) 3 VIEWS TECHNIQUE: Fluoroscopic spot image(s) were submitted for interpretation post-operatively. COMPARISON:  03/19/2020 FINDINGS: Three low resolution intraoperative spot views of the right hip. Total fluoroscopy time was 42 seconds. The images demonstrate intramedullary rod and distal screw fixation of the right femur. There is anatomic alignment. IMPRESSION: Intraoperative fluoroscopic assistance provided during surgical fixation of right femur Electronically Signed   By: Jasmine Pang M.D.   On: 03/20/2020 15:47        Scheduled Meds: . brimonidine  1 drop Both Eyes BID  . Chlorhexidine Gluconate Cloth  6 each Topical Daily  . dorzolamide-timolol  1 drop Both Eyes BID  . enoxaparin (LOVENOX) injection  30 mg Subcutaneous Q24H  .  febuxostat  40 mg Oral Daily  . feeding supplement (ENSURE ENLIVE)  237 mL Oral BID BM  . multivitamin with minerals  1 tablet Oral Daily  . simvastatin  20 mg Oral Daily   Continuous Infusions: . azithromycin Stopped (03/21/20 1807)  . cefTRIAXone (ROCEPHIN)  IV Stopped (03/21/20 1951)  . lactated ringers 10 mL/hr at 03/22/20 0400     LOS: 2 days     Alwyn Ren, MD  03/22/2020, 11:41 AM

## 2020-03-22 NOTE — Progress Notes (Signed)
Physical Therapy Treatment Patient Details Name: Hayley Maynard MRN: [id number] DOB: 1922/06/15 Today's Date: 03/22/2020    History of Present Illness Per MD note: Hayley Maynard is a 86 y.o. female with medical history significant for hypertension, chronic kidney disease stage IIIb, and coronary artery disease, now presenting to the emergency department for evaluation of falls and right hip pain.  Patient is usually able to ambulate with a walker, but seemed to be generally weak yesterday per report of her daughter, had her knees "give out" per report of the patient, leading to a fall.  This happened twice.  Patient denies hitting her head or losing consciousness but has been experiencing right hip pain when she attempts to bear weight.  Patient denies any shortness of breath, chest pain, chills, or cough, but her daughter notes that there has been a mild cough since yesterday though no apparent dyspnea    PT Comments    Patient performs bed mobility with max assist supine <> sit , transfers sit to stand with RW and max assist x 2 reps. Patient feels nausea with standing and is unable to move her legs in standing and needs support to maintain static stand with UE using RW. She is able to stand for 5-10 seconds. She is not able to ambulate or raise either leg in standing. Patient will continue to benefit from skilled PT to improve mobility and strength.   Follow Up Recommendations  SNF     Equipment Recommendations  Rolling walker with 5" wheels    Recommendations for Other Services       Precautions / Restrictions      Mobility  Bed Mobility Overal bed mobility: Needs Assistance Bed Mobility: Supine to Sit;Sit to Supine           General bed mobility comments: max assist per PT report  Transfers Overall transfer level: Needs assistance   Transfers: Sit to/from Stand Sit to Stand: Max assist         General transfer comment: max assist per PT  report  Ambulation/Gait Ambulation/Gait assistance:  (unable)               Stairs             Wheelchair Mobility    Modified Rankin (Stroke Patients Only)       Balance Overall balance assessment: Needs assistance Sitting-balance support: Bilateral upper extremity supported Sitting balance-Leahy Scale: Fair     Standing balance support: Bilateral upper extremity supported Standing balance-Leahy Scale: Poor                              Cognition Arousal/Alertness: Awake/alert Behavior During Therapy: WFL for tasks assessed/performed Overall Cognitive Status: Within Functional Limits for tasks assessed                                        Exercises General Exercises - Lower Extremity Long Arc Quad: Both;5 reps Hip ABduction/ADduction: Both;5 reps    General Comments        Pertinent Vitals/Pain Pain Assessment: Faces Faces Pain Scale: Hurts little more Pain Location:  (left hip) Pain Intervention(s): Limited activity within patient's tolerance;Monitored during session    Home Living                      Prior Function  PT Goals (current goals can now be found in the care plan section) Acute Rehab PT Goals Patient Stated Goal: to walk Progress towards PT goals: Progressing toward goals    Frequency           PT Plan Current plan remains appropriate    Co-evaluation              AM-PAC PT "6 Clicks" Mobility   Outcome Measure  Help needed turning from your back to your side while in a flat bed without using bedrails?: A Lot Help needed moving from lying on your back to sitting on the side of a flat bed without using bedrails?: A Lot Help needed moving to and from a bed to a chair (including a wheelchair)?: A Lot Help needed standing up from a chair using your arms (e.g., wheelchair or bedside chair)?: A Lot Help needed to walk in hospital room?: Total Help needed climbing 3-5  steps with a railing? : Total 6 Click Score: 10    End of Session Equipment Utilized During Treatment: Gait belt Activity Tolerance: Patient limited by fatigue Patient left: in bed;with bed alarm set Nurse Communication: Mobility status PT Visit Diagnosis: Unsteadiness on feet (R26.81);Muscle weakness (generalized) (M62.81);History of falling (Z91.81);Difficulty in walking, not elsewhere classified (R26.2)     Time: 1032-1100 PT Time Calculation (min) (ACUTE ONLY): 28 min  Charges:  $Therapeutic Activity: 23-37 mins             Jones Broom S, PT DPT 03/22/2020, 11:08 AM

## 2020-03-23 DIAGNOSIS — Z7189 Other specified counseling: Secondary | ICD-10-CM

## 2020-03-23 DIAGNOSIS — Z515 Encounter for palliative care: Secondary | ICD-10-CM

## 2020-03-23 LAB — BASIC METABOLIC PANEL
Anion gap: 10 (ref 5–15)
BUN: 33 mg/dL — ABNORMAL HIGH (ref 8–23)
CO2: 28 mmol/L (ref 22–32)
Calcium: 8.3 mg/dL — ABNORMAL LOW (ref 8.9–10.3)
Chloride: 104 mmol/L (ref 98–111)
Creatinine, Ser: 1.47 mg/dL — ABNORMAL HIGH (ref 0.44–1.00)
GFR calc Af Amer: 35 mL/min — ABNORMAL LOW (ref >60)
GFR calc non Af Amer: 30 mL/min — ABNORMAL LOW (ref >60)
Glucose, Bld: 127 mg/dL — ABNORMAL HIGH (ref 70–99)
Potassium: 4.1 mmol/L (ref 3.5–5.1)
Sodium: 142 mmol/L (ref 135–145)

## 2020-03-23 LAB — CBC
HCT: 26.3 % — ABNORMAL LOW (ref 36.0–46.0)
Hemoglobin: 7.8 g/dL — ABNORMAL LOW (ref 12.0–15.0)
MCH: 29.9 pg (ref 26.0–34.0)
MCHC: 29.7 g/dL — ABNORMAL LOW (ref 30.0–36.0)
MCV: 100.8 fL — ABNORMAL HIGH (ref 80.0–100.0)
Platelets: 170 10*3/uL (ref 150–400)
RBC: 2.61 MIL/uL — ABNORMAL LOW (ref 3.87–5.11)
RDW: 13.8 % (ref 11.5–15.5)
WBC: 6.7 10*3/uL (ref 4.0–10.5)
nRBC: 0 % (ref 0.0–0.2)

## 2020-03-23 MED ORDER — CEFUROXIME AXETIL 250 MG PO TABS
250.0000 mg | ORAL_TABLET | Freq: Two times a day (BID) | ORAL | Status: DC
  Administered 2020-03-23 – 2020-03-24 (×2): 250 mg via ORAL
  Filled 2020-03-23 (×3): qty 1

## 2020-03-23 MED ORDER — FE FUMARATE-B12-VIT C-FA-IFC PO CAPS
1.0000 | ORAL_CAPSULE | Freq: Two times a day (BID) | ORAL | Status: DC
  Administered 2020-03-23 – 2020-03-24 (×3): 1 via ORAL
  Filled 2020-03-23 (×4): qty 1

## 2020-03-23 NOTE — TOC Progression Note (Signed)
Transition of Care [facility]) - Progression Note    Patient Details  Name: Hayley Maynard MRN: [id number] Date of Birth: [birth year]  Transition of [facility]) CM/SW Contact  Barrie Dunker, RN Phone Number: 03/23/2020, 2:23 PM  Clinical Narrative:    Spoke with the patient and her daughter in the room  We reviewed the bed offers in detail along with the star ratings,  They chose [facility], I notified Chris at [facility], I faxed the clinical informamrion to [phone] to start the auth process, the patient is fully covid vaccinated        Expected Discharge Plan and Services                                                 Social Determinants of Health (SDOH) Interventions    Readmission Risk Interventions No flowsheet data found.

## 2020-03-23 NOTE — Consult Note (Signed)
Consultation Note Date: 03/23/2020   Patient Name: Hayley Maynard  DOB: September 20, 1921  MRN: [id number]  Age / Sex: 86 y.o., female   PCP: [place] Pink, MD Referring Physician: Georgette Shell, MD   REASON FOR CONSULTATION:Establishing goals of care  Palliative Care consult requested for goals of care discussion in this 86 y.o. female with multiple medical problems including hypertension, chronic kidney disease stage IIIb, and coronary artery disease.  Patient presented to the ED after suffering a fall with right hip pain.  Head CT negative for acute abnormalities.  Pelvis x-ray concerning for avulsion of right greater trochanter.  CT of right hip showed comminuted impacted and mildly displaced fracture of the right greater trochanter.  Chest x-ray is concerning for possible left lower lobe pneumonia.  Patient evaluated by orthopedics and underwent right hip IM nailing on 03/20/2020.  Patient has been evaluated and followed by PT with recommendation of SNF placement for continued rehabilitation.  Clinical Assessment and Goals of Care: I have reviewed medical records including lab results, imaging, Epic notes, and MAR, received report from the bedside RN, and assessed the patient. I met at the bedside with Hayley Maynard to discuss diagnosis prognosis, [place], EOL wishes, disposition and options.  Patient is awake, alert and oriented x3.  Her godson, Hayley Maynard is at the bedside visiting.  Patient denies pain.  I introduced Palliative Medicine as specialized medical care for people living with serious illness. It focuses on providing relief from the symptoms and stress of a serious illness. The goal is to improve quality of life for both the patient and the family.  Patient verbalized understanding and appreciation.  We discussed a brief life review of the patient, along with her functional and nutritional status.  Hayley Maynard reports she is a widower x21 years.  She has 2 children (daughter lives in [facility] and son lives in [place]).  She is a Writer of [employer].  She is a retired AT Engineer, drilling.  She speaks of her strong [place] faith and enjoyment of spending time with family and friends.  Patient and family reports prior to admission patient lived alone in her home.  She was able to perform all ADLs including housekeeping cleaning.  Hayley Maynard reports her family and or neighbors called [facility] and assist with errands and doctors appointments.  She typically ambulates within her home with the use of a walker.  Patient reports she is able to prepare her own meals majority of the time.  She denies use of oxygen in the home.  We discussed Her current illness and what it means in the larger context of Her on-going co-morbidities. Natural disease trajectory and expectations at EOL were discussed.  Hayley Maynard reports understanding of her current condition.  She states "I know I am not a spring chicken but I am doing better than people that are younger than I am!"  Therapeutic listening and support provided.  Patient states her goal is to continue with rehabilitation at SNF with hopes of being able to return back home with family and friends support.  I attempted to elicit values and goals of care important to the patient.     Patient reports wishes to continue to treat the treatable while hospitalized.  Speaks to her good quality of life prior to admission despite her advanced age.  She remains hopeful that she will continue to improve and be able to continue to care for herself in some  manner.  Advanced directives, concepts specific to code status, artifical feeding and hydration, and rehospitalization were considered and discussed. Patient reports that she does have a documented advanced directive. Hayley Maynard states that her daughter Hayley Maynard is her Education officer, community.  Patient reports she would not want artificial feeding and hydration.  I discussed with her at length regarding her full  CODE STATUS given her chronic comorbidities and advanced age.  Patient reports she is not interested in heroic measures including CPR or intubation.  I recommend changing her CODE STATUS to a DNR/DNI based on her expressed wishes however patient states she would not want to change her status and says she had a discussion with both of her children and made sure they had a clear understanding of her wishes.  She shares she is sure her daughter is aware however she wants to be certain they are comfortable with her wishes more so speaking of her son.  Patient reports if something was to happen prior to her having a discussion with her children her daughter would make the best decision for her at that time with awareness of her wishes.  Given patient's expressed wishes to continue to treat the treatable with hopes of improving palliative Care services outpatient were explained and offered. Patient verbalized their understanding and awareness of palliative's goals and philosophy of care.  Hayley Maynard is requesting outpatient palliative support at discharge.  Questions and concerns were addressed.  Patient was encouraged to call with questions or concerns.  PMT will continue to support holistically.   SOCIAL HISTORY:     reports that she has been smoking cigarettes. She has never used smokeless tobacco. She reports previous alcohol use.  CODE STATUS: Full code  ADVANCE DIRECTIVES: Hayley Maynard (daughter/HCPOA) SYMPTOM MANAGEMENT: Per attending  Palliative Prophylaxis:   Aspiration, Bowel Regimen, Frequent Pain Assessment, Oral Care and Turn Reposition  PSYCHO-SOCIAL/SPIRITUAL:  Support System: Family  Desire for further Chaplaincy support: No  Additional Recommendations (Limitations, Scope, Preferences):  Continue to treat the treatable while hospitalized.  Education on palliative    PAST MEDICAL HISTORY: Past Medical History:  Diagnosis Date  . Arthritis    feet  . CKD (chronic kidney  disease), stage III   . Coronary artery disease   . Glaucoma   . Hyperlipidemia   . Hypertension   . LVH (left ventricular hypertrophy)   . Wears dentures    full upper and lower    ALLERGIES:  is allergic to propoxyphene and telmisartan-hctz.   MEDICATIONS:  Current Facility-Administered Medications  Medication Dose Route Frequency Provider Last Rate Last Admin  . acetaminophen (TYLENOL) tablet 325-650 mg  325-650 mg Oral Q6H PRN Hessie Knows, MD      . alum & mag hydroxide-simeth (MAALOX/MYLANTA) 200-200-20 MG/5ML suspension 30 mL  30 mL Oral Q4H PRN Hessie Knows, MD      . azithromycin (ZITHROMAX) 500 mg in sodium chloride 0.9 % 250 mL IVPB  500 mg Intravenous Q24H Georgette Shell, MD 250 mL/hr at 03/22/20 1607 500 mg at 03/22/20 1607  . bisacodyl (DULCOLAX) suppository 10 mg  10 mg Rectal Daily PRN Hessie Knows, MD      . brimonidine (ALPHAGAN) 0.2 % ophthalmic solution 1 drop  1 drop Both Eyes BID Opyd, Ilene Qua, MD   1 drop at 03/23/20 0926  . cefTRIAXone (ROCEPHIN) 1 g in sodium chloride 0.9 % 100 mL IVPB  1 g Intravenous Q24H Georgette Shell, MD   Stopped at 03/22/20 2208  .  Chlorhexidine Gluconate Cloth 2 % PADS 6 each  6 each Topical Daily Hessie Knows, MD   6 each at 03/21/20 1022  . dorzolamide-timolol (COSOPT) 22.3-6.8 MG/ML ophthalmic solution 1 drop  1 drop Both Eyes BID Opyd, Ilene Qua, MD   1 drop at 03/23/20 0927  . enoxaparin (LOVENOX) injection 30 mg  30 mg Subcutaneous Q24H Hessie Knows, MD   30 mg at 03/23/20 0841  . febuxostat (ULORIC) tablet 40 mg  40 mg Oral Daily Opyd, Ilene Qua, MD   40 mg at 03/23/20 0925  . feeding supplement (ENSURE ENLIVE) (ENSURE ENLIVE) liquid 237 mL  237 mL Oral BID BM Hessie Knows, MD   237 mL at 03/23/20 0927  . ferrous [id number] C-folic acid (TRINSICON / FOLTRIN) capsule 1 capsule  1 capsule Oral BID PC Hessie Knows, MD   1 capsule at 03/23/20 0841  . HYDROcodone-acetaminophen (NORCO) 7.5-325 MG per tablet  1-2 tablet  1-2 tablet Oral Q4H PRN Hessie Knows, MD      . HYDROcodone-acetaminophen (NORCO/VICODIN) 5-325 MG per tablet 1-2 tablet  1-2 tablet Oral Q4H PRN Hessie Knows, MD      . lactated ringers infusion   Intravenous Continuous Hessie Knows, MD 10 mL/hr at 03/22/20 2210 Restarted at 03/22/20 2210  . magnesium citrate solution 1 Bottle  1 Bottle Oral Once PRN Hessie Knows, MD      . magnesium hydroxide (MILK OF MAGNESIA) suspension 30 mL  30 mL Oral Daily PRN Hessie Knows, MD      . menthol-cetylpyridinium (CEPACOL) lozenge 3 mg  1 lozenge Oral PRN Hessie Knows, MD       Or  . phenol (CHLORASEPTIC) mouth spray 1 spray  1 spray Mouth/Throat PRN Hessie Knows, MD      . metoCLOPramide (REGLAN) tablet 5-10 mg  5-10 mg Oral Q8H PRN Hessie Knows, MD   10 mg at 03/22/20 1409   Or  . metoCLOPramide (REGLAN) injection 5-10 mg  5-10 mg Intravenous Q8H PRN Hessie Knows, MD      . morphine 2 MG/ML injection 0.5-1 mg  0.5-1 mg Intravenous Q2H PRN Hessie Knows, MD      . multivitamin with minerals tablet 1 tablet  1 tablet Oral Daily Hessie Knows, MD   1 tablet at 03/23/20 [phone]  . ondansetron (ZOFRAN) tablet 4 mg  4 mg Oral Q6H PRN Hessie Knows, MD       Or  . ondansetron [facility]) injection 4 mg  4 mg Intravenous Q6H PRN Hessie Knows, MD      . senna-docusate (Senokot-S) tablet 1 tablet  1 tablet Oral QHS PRN Opyd, Ilene Qua, MD      . simvastatin (ZOCOR) tablet 20 mg  20 mg Oral Daily Opyd, Ilene Qua, MD   20 mg at 03/23/20 0925  . traMADol (ULTRAM) tablet 50 mg  50 mg Oral Q6H PRN Hessie Knows, MD   50 mg at 03/20/20 2115    VITAL SIGNS: BP (!) 129/86 (BP Location: Right Arm)   Pulse 62   Temp 99.1 F (37.3 C) (Oral)   Resp 16   Ht 5' (1.524 m)   Wt 47.2 kg   SpO2 99%   BMI 20.32 kg/m  [facility] Weights   03/19/20 2100 03/20/20 1807  Weight: 47.2 kg 47.2 kg    Estimated body mass index is 20.32 kg/m as calculated from the following:   Height as of this encounter: 5' (1.524  m).   Weight as of this encounter: 47.2  kg.  LABS: CBC:    Component Value Date/Time   WBC 6.7 03/23/2020 0452   HGB 7.8 (L) 03/23/2020 0452   HCT 26.3 (L) 03/23/2020 0452   PLT 170 03/23/2020 0452   Comprehensive Metabolic Panel:    Component Value Date/Time   NA 142 03/23/2020 0452   K 4.1 03/23/2020 0452   BUN 33 (H) 03/23/2020 0452   CREATININE 1.47 (H) 03/23/2020 0452     Review of Systems  Neurological: Positive for weakness.  Unless otherwise noted, a complete review of systems is negative.  Physical Exam General: NAD Cardiovascular: regular rate and rhythm Pulmonary: diminished bilaterally  Abdomen: soft, nontender, + bowel sounds Extremities: no edema, no joint deformities Skin: no rashes, warm and dry Neurological: Awake, alert and oriented x3, mood appropriate   Prognosis: Unable to determine  Discharge Planning:  [place] for rehab with Palliative care service follow-up  Recommendations: . Full Code-as confirmed by patient.  Patient does express wishes for no heroic/life-sustaining measures however did not want to change status and to she was able to speak with both of her children. . Continue with current plan of care per medical team  . Patient remains hopeful for some improvement and ability to participate in rehabilitation with a goal of returning home with family and friends support. . Patient requesting outpatient palliative support at discharge. Marland Kitchen PMT will continue to support and follow. Please call team line with urgent needs.   Palliative Performance Scale: PPS 30%              Patient expressed understanding and was in agreement with this plan.   Thank you for allowing the Palliative Medicine Team to assist in the care of this patient.  Time In: 1035 Time Out: 1140 Time Total: 65 min.   Visit consisted of counseling and education dealing with the complex and emotionally intense issues of symptom management and  palliative care in the setting of serious and potentially life-threatening illness.Greater than 50%  of this time was spent counseling and coordinating care related to the above assessment and plan.  Signed by:  Alda Lea, AGPCNP-BC Palliative Medicine Team  Phone: [phone] Pager: [phone] Amion: Bjorn Pippin

## 2020-03-23 NOTE — Progress Notes (Signed)
Subjective: 3 Days Post-Op Procedure(s) (LRB): INTRAMEDULLARY (IM) NAIL INTERTROCHANTRIC (Right) Patient reports pain as mild.   Patient is well, and has had no acute complaints or problems Plan is to go to rehab after hospital stay. Negative for chest pain and shortness of breath Fever: no Gastrointestinal: Negative for nausea and vomiting  Objective: Vital signs in last 24 hours: Temp:  [97.6 F (36.4 C)-99.1 F (37.3 C)] 99.1 F (37.3 C) (08/02 0723) Pulse Rate:  [53-62] 62 (08/02 0723) Resp:  [16-17] 16 (08/02 0723) BP: (100-129)/(51-86) 129/86 (08/02 0723) SpO2:  [99 %-100 %] 99 % (08/02 0723)  Intake/Output from previous day: No intake or output data in the 24 hours ending 03/23/20 0852  Intake/Output this shift: No intake/output data recorded.  Labs: Recent Labs    03/20/20 1734 03/21/20 0406 03/22/20 0522 03/23/20 0452  HGB 10.0* 8.2* 8.3* 7.8*   Recent Labs    03/22/20 0522 03/23/20 0452  WBC 8.2 6.7  RBC 2.72* 2.61*  HCT 26.3* 26.3*  PLT 156 170   Recent Labs    03/22/20 0522 03/23/20 0452  NA 142 142  K 4.0 4.1  CL 105 104  CO2 31 28  BUN 38* 33*  CREATININE 1.52* 1.47*  GLUCOSE 105* 127*  CALCIUM 8.1* 8.3*   No results for input(s): LABPT, INR in the last 72 hours.   EXAM General - Patient is Alert and Oriented Extremity - Neurovascular intact Sensation intact distally Dorsiflexion/Plantar flexion intact No cellulitis present Compartment soft  Mild drainage noted tot he more proximal incision site.  No drainage to distal incision. Motor Function - intact, moving foot and toes well on exam.  Abdomen is soft with normal bowel sounds.  Past Medical History:  Diagnosis Date  . Arthritis    feet  . CKD (chronic kidney disease), stage III   . Coronary artery disease   . Glaucoma   . Hyperlipidemia   . Hypertension   . LVH (left ventricular hypertrophy)   . Wears dentures    full upper and lower    Assessment/Plan: 3 Days  Post-Op Procedure(s) (LRB): INTRAMEDULLARY (IM) NAIL INTERTROCHANTRIC (Right) Principal Problem:   Displaced fracture of greater trochanter of right femur, initial encounter for closed fracture [facility]) Active Problems:   Benign essential hypertension   CAD (coronary artery disease)   CKD (chronic kidney disease) stage 3, GFR 30-59 ml/min   CAP (community acquired pneumonia)  Estimated body mass index is 20.32 kg/m as calculated from the following:   Height as of this encounter: 5' (1.524 m).   Weight as of this encounter: 47.2 kg. Advance diet Up with therapy D/C IV fluids   Patient has had a BM.   Plan for discharge to SNF today. Follow-up at [facility] clinic orthopedics in 2 weeks for staple removal  DVT Prophylaxis - Lovenox, Foot Pumps and TED hose Weight-Bearing as tolerated to right leg  J. Horris Latino, PA-C Orthopaedic Surgery 03/23/2020, 8:52 AM

## 2020-03-23 NOTE — Care Management Important Message (Signed)
Important Message  Patient Details  Name: Hayley Maynard MRN: [id number] Date of Birth: 22-Oct-1921   Medicare Important Message Given:  Yes     Barrie Dunker, RN 03/23/2020, 10:22 AM

## 2020-03-23 NOTE — Progress Notes (Signed)
PROGRESS NOTE    Hayley Maynard  PZW:[id number] DOB: December 09, 1921 DOA: 03/20/2020 PCP: Jerl Mina, MD    Brief Narrative:  Hayley Maynard is a 86 y.o. female with medical history significant for hypertension, chronic kidney disease stage IIIb, and coronary artery disease, now presenting to the emergency department for evaluation of falls and right hip pain.  Patient is usually able to ambulate with a walker, but seemed to be generally weak yesterday per report of her daughter, had her knees "give out" per report of the patient, leading to a fall.  This happened twice.  Patient denies hitting her head or losing consciousness but has been experiencing right hip pain when she attempts to bear weight.  Patient denies any shortness of breath, chest pain, chills, or cough, but her daughter notes that there has been a mild cough since yesterday though no apparent dyspnea.  ED Course: Upon arrival to the ED, patient is found to be afebrile, saturating mid 90s on room air, normal respirations, and stable blood pressure.  EKG features a sinus rhythm with PVCs and LAD.  Noncontrast head CT is negative for acute intracranial abnormality.  Radiographs of the pelvis are concerning for avulsion of the right greater trochanter and CT of the right hip demonstrates comminuted impacted and mildly displaced fracture of the right greater trochanter.  Chest x-ray is concerning for possible left lower lobe pneumonia and CT chest has been ordered to further evaluate for this.  Chemistry panel features a creatinine 1.65 and CBC is notable for a leukocytosis to 12,400.  Troponin is mildly elevated.  Orthopedic surgery was consulted by the ED physician and recommended MRI hip.  Patient was given 500 cc normal saline and morphine in the ED.  Assessment & Plan:   Principal Problem:   Displaced fracture of greater trochanter of right femur, initial encounter for closed fracture [facility]) Active Problems:   Benign essential  hypertension   CAD (coronary artery disease)   CKD (chronic kidney disease) stage 3, GFR 30-59 ml/min   CAP (community acquired pneumonia)   #1 Right greater trochanteric fracture status post mechanical fall at home.  Pain controlled with Tylenol DVT prophylaxis with Lovenox.  Seen by PT recommends SNF. Patient is having loose bowel movements hold stool softeners. Ultram Norco Tylenol for pain control DC morphine Case manager consulted for SNF.  #2 community-acquired pneumonia on Rocephin and azithromycin which probably could be causing the diarrhea.  Add probiotics.   CT of the chest-patchy bibasilar atelectasis with areas of suspected consolidation/pneumonia in the anterolateral posteromedial left base regions.  No pleural effusion. Saturating 100% on 2 L nasal cannula.  #3 CKD stage IIIb stable creatinine 1.49 today.  #4 history of essential hypertension-blood pressure still soft 106/54 continue to hold Norvasc and hydralazine.  #5 history of CAD/hyperlipidemia on Zocor  Nutrition Problem: Increased nutrient needs Etiology: hip fracture  Signs/Symptoms: estimated needs  Interventions: Ensure Enlive (each supplement provides 350kcal and 20 grams of protein), MVI  Estimated body mass index is 20.32 kg/m as calculated from the following:   Height as of this encounter: 5' (1.524 m).   Weight as of this encounter: 47.2 kg.  DVT prophylaxis: Lovenox Code Status: Full code Family Communication: None at bedside  disposition Plan:  Status is: Inpatient  Dispo: The patient is from: Home              Anticipated d/c is to: SNF  Anticipated d/c date is: 2 days              Patient currently is medically stable to be discharged status post hip fracture surgery awaiting SNF   Consultants:   Ortho  Procedures: Right hip ORIF 03/20/2020 Antimicrobials:  Anti-infectives (From admission, onward)   Start     Dose/Rate Route Frequency Ordered Stop   03/23/20 2000   cefUROXime (CEFTIN) tablet 250 mg     Discontinue     250 mg Oral 2 times daily with meals 03/23/20 1453 03/26/20 1959   03/22/20 1600  levofloxacin (LEVAQUIN) IVPB 500 mg  Status:  Discontinued       "Followed by" Linked Group Details   500 mg 100 mL/hr over 60 Minutes Intravenous Every 48 hours 03/20/20 1515 03/21/20 1451   03/21/20 1500  cefTRIAXone (ROCEPHIN) 1 g in sodium chloride 0.9 % 100 mL IVPB  Status:  Discontinued        1 g 200 mL/hr over 30 Minutes Intravenous Every 24 hours 03/21/20 1452 03/23/20 1453   03/21/20 1500  azithromycin (ZITHROMAX) 500 mg in sodium chloride 0.9 % 250 mL IVPB     Discontinue     500 mg 250 mL/hr over 60 Minutes Intravenous Every 24 hours 03/21/20 1452 03/23/20 2359   03/20/20 2100  ceFAZolin (ANCEF) IVPB 1 g/50 mL premix        1 g 100 mL/hr over 30 Minutes Intravenous Every 6 hours 03/20/20 1716 03/21/20 1045   03/20/20 1800  cefTRIAXone (ROCEPHIN) 2 g in sodium chloride 0.9 % 100 mL IVPB  Status:  Discontinued        2 g 200 mL/hr over 30 Minutes Intravenous Every 24 hours 03/20/20 1716 03/20/20 1731   03/20/20 1730  azithromycin (ZITHROMAX) 500 mg in sodium chloride 0.9 % 250 mL IVPB  Status:  Discontinued        500 mg 250 mL/hr over 60 Minutes Intravenous Every 24 hours 03/20/20 1716 03/20/20 1731   03/20/20 1600  levofloxacin (LEVAQUIN) IVPB 750 mg       "Followed by" Linked Group Details   750 mg 100 mL/hr over 90 Minutes Intravenous  Once 03/20/20 1515 03/20/20 2235   03/20/20 1500  azithromycin (ZITHROMAX) 250 mg in dextrose 5 % 125 mL IVPB  Status:  Discontinued        250 mg 125 mL/hr over 60 Minutes Intravenous Every 24 hours 03/20/20 1453 03/20/20 1454   03/20/20 1500  Levofloxacin (LEVAQUIN) IVPB 250 mg  Status:  Discontinued        250 mg 50 mL/hr over 60 Minutes Intravenous Every 48 hours 03/20/20 1454 03/20/20 1514   03/20/20 1400  ceFAZolin (ANCEF) IVPB 1 g/50 mL premix        1 g 100 mL/hr over 30 Minutes Intravenous  Once  03/20/20 0801 03/20/20 1441      Subjective: She is resting in bed awake alert in no acute distress denies any new complaints denies any chest pain or shortness of breath.  She lives alone.  She has no family in the area. Objective: Vitals:   03/22/20 0021 03/22/20 1604 03/22/20 2330 03/23/20 0723  BP: (!) 106/54 (!) 116/51 (!) 100/55 (!) 129/86  Pulse: 61 53 53 62  Resp: 16 17 17 16   Temp: 98 F (36.7 C) 97.6 F (36.4 C) 98.5 F (36.9 C) 99.1 F (37.3 C)  TempSrc: Oral Oral Oral Oral  SpO2: 100% 100% 99% 99%  Weight:  Height:        Intake/Output Summary (Last 24 hours) at 03/23/2020 1521 Last data filed at 03/23/2020 1040 Gross per 24 hour  Intake 120 ml  Output --  Net 120 ml   Filed Weights   03/19/20 2100 03/20/20 1807  Weight: 47.2 kg 47.2 kg    Examination:  General exam: Appears calm and comfortable  Respiratory system: Few scattered rhonchi on the left more than right to auscultation. Respiratory effort normal. Cardiovascular system: S1 & S2 heard, RRR. No JVD, murmurs, rubs, gallops or clicks. No pedal edema. Gastrointestinal system: Abdomen is nondistended, soft and nontender. No organomegaly or masses felt. Normal bowel sounds heard. Central nervous system: Alert and oriented. No focal neurological deficits. Extremities:right hip covered with dressings Skin: No rashes, lesions or ulcers Psychiatry: Judgement and insight appear normal. Mood & affect appropriate.     Data Reviewed: I have personally reviewed following labs and imaging studies  CBC: Recent Labs  Lab 03/19/20 2110 03/20/20 1734 03/21/20 0406 03/22/20 0522 03/23/20 0452  WBC 12.4* 12.6* 8.8 8.2 6.7  HGB 11.5* 10.0* 8.2* 8.3* 7.8*  HCT 34.8* 30.9* 26.1* 26.3* 26.3*  MCV 94.1 96.3 98.9 96.7 100.8*  PLT 206 172 139* 156 170   Basic Metabolic Panel: Recent Labs  Lab 03/19/20 2110 03/20/20 1734 03/21/20 0406 03/22/20 0522 03/23/20 0452  NA 141  --  141 142 142  K 4.2  --  4.3  4.0 4.1  CL 101  --  104 105 104  CO2 23  --  27 31 28   GLUCOSE 107*  --  104* 105* 127*  BUN 32*  --  37* 38* 33*  CREATININE 1.65* 1.65* 1.49* 1.52* 1.47*  CALCIUM 9.1  --  7.8* 8.1* 8.3*   GFR: Estimated Creatinine Clearance: 16.1 mL/min (A) (by C-G formula based on SCr of 1.47 mg/dL (H)). Liver Function Tests: No results for input(s): AST, ALT, ALKPHOS, BILITOT, PROT, ALBUMIN in the last 168 hours. No results for input(s): LIPASE, AMYLASE in the last 168 hours. No results for input(s): AMMONIA in the last 168 hours. Coagulation Profile: No results for input(s): INR, PROTIME in the last 168 hours. Cardiac Enzymes: No results for input(s): CKTOTAL, CKMB, CKMBINDEX, TROPONINI in the last 168 hours. BNP (last 3 results) No results for input(s): PROBNP in the last 8760 hours. HbA1C: No results for input(s): HGBA1C in the last 72 hours. CBG: Recent Labs  Lab 03/20/20 2329  GLUCAP 96   Lipid Profile: No results for input(s): CHOL, HDL, LDLCALC, TRIG, CHOLHDL, LDLDIRECT in the last 72 hours. Thyroid Function Tests: No results for input(s): TSH, T4TOTAL, FREET4, T3FREE, THYROIDAB in the last 72 hours. Anemia Panel: No results for input(s): VITAMINB12, FOLATE, FERRITIN, TIBC, IRON, RETICCTPCT in the last 72 hours. Sepsis Labs: Recent Labs  Lab 03/20/20 0555 03/21/20 0406  PROCALCITON 1.57 0.97    Recent Results (from the past 240 hour(s))  SARS Coronavirus 2 by RT PCR (hospital order, performed in [facility] hospital lab) Nasopharyngeal Nasopharyngeal Swab     Status: None   Collection Time: 03/20/20  4:18 AM   Specimen: Nasopharyngeal Swab  Result Value Ref Range Status   SARS Coronavirus 2 NEGATIVE NEGATIVE Final    Comment: (NOTE) SARS-CoV-2 target nucleic acids are NOT DETECTED.  The SARS-CoV-2 RNA is generally detectable in upper and lower respiratory specimens during the acute phase of infection. The lowest concentration of SARS-CoV-2 viral copies this assay can  detect is 250 copies / mL. A  negative result does not preclude SARS-CoV-2 infection and should not be used as the sole basis for treatment or other patient management decisions.  A negative result may occur with improper specimen collection / handling, submission of specimen other than nasopharyngeal swab, presence of viral mutation(s) within the areas targeted by this assay, and inadequate number of viral copies (<250 copies / mL). A negative result must be combined with clinical observations, patient history, and epidemiological information.  Fact Sheet for Patients:   BoilerBrush.com.cyhttps://www.fda.gov/media/136312/download  Fact Sheet for Healthcare Providers: https://pope.com/https://www.fda.gov/media/136313/download  This test is not yet approved or  cleared by the [place] FDA and has been authorized for detection and/or diagnosis of SARS-CoV-2 by FDA under an Emergency Use Authorization (EUA).  This EUA will remain in effect (meaning this test can be used) for the duration of the COVID-19 declaration under Section 564(b)(1) of the Act, 21 U.S.C. section 360bbb-3(b)(1), unless the authorization is terminated or revoked sooner.  Performed at [facility], [address] [place], [place] [address]          Radiology Studies: No results found.      Scheduled Meds: . brimonidine  1 drop Both Eyes BID  . cefUROXime  250 mg Oral BID WC  . Chlorhexidine Gluconate Cloth  6 each Topical Daily  . dorzolamide-timolol  1 drop Both Eyes BID  . enoxaparin (LOVENOX) injection  30 mg Subcutaneous Q24H  . febuxostat  40 mg Oral Daily  . feeding supplement (ENSURE ENLIVE)  237 mL Oral BID BM  . ferrous fumarate-b12-vitamic C-folic acid  1 capsule Oral BID PC  . multivitamin with minerals  1 tablet Oral Daily  . simvastatin  20 mg Oral Daily   Continuous Infusions: . azithromycin 500 mg (03/23/20 1456)  . lactated ringers 10 mL/hr at 03/22/20 2210     LOS: 3 days     Alwyn RenElizabeth G  Florance Paolillo, MD  03/23/2020, 3:21 PM

## 2020-03-23 NOTE — Progress Notes (Signed)
Physical Therapy Treatment Patient Details Name: Hayley Maynard MRN: [id number] DOB: 1922/03/17 Today's Date: 03/23/2020    History of Present Illness Per MD note: Hayley Maynard is a 86 y.o. female with medical history significant for hypertension, chronic kidney disease stage IIIb, and coronary artery disease, now presenting to the emergency department for evaluation of falls and right hip pain.  Patient is usually able to ambulate with a walker, but seemed to be generally weak yesterday per report of her daughter, had her knees "give out" per report of the patient, leading to a fall.  This happened twice.  Patient denies hitting her head or losing consciousness but has been experiencing right hip pain when she attempts to bear weight.  Patient denies any shortness of breath, chest pain, chills, or cough, but her daughter notes that there has been a mild cough since yesterday though no apparent dyspnea    PT Comments    Pt ready for session.  Participated in exercises as described below.  To EOB with mod a x 1 and encouragement.  She is able to sit x 4 minutes with mod a x 1 with left lean.  Does not correct with verbal or tactile cues.  Requests to lay back down due to fatigue.  "That was rough."  Returned to supine with max a x 1.      Follow Up Recommendations  SNF     Equipment Recommendations  Rolling walker with 5" wheels    Recommendations for Other Services       Precautions / Restrictions Precautions Precautions: Fall Restrictions Weight Bearing Restrictions: Yes RLE Weight Bearing: Weight bearing as tolerated    Mobility  Bed Mobility Overal bed mobility: Needs Assistance Bed Mobility: Supine to Sit;Sit to Supine   Sidelying to sit: Mod assist Supine to sit: Max assist        Transfers Overall transfer level: Needs assistance Equipment used: Rolling walker (2 wheeled)                Ambulation/Gait         Gait velocity: decreased       Stairs              Wheelchair Mobility    Modified Rankin (Stroke Patients Only)       Balance Overall balance assessment: Needs assistance Sitting-balance support: Bilateral upper extremity supported Sitting balance-Leahy Scale: Poor                                      Cognition Arousal/Alertness: Awake/alert Behavior During Therapy: WFL for tasks assessed/performed Overall Cognitive Status: Within Functional Limits for tasks assessed                                        Exercises Other Exercises Other Exercises: BLE AAROM ankle pumps, quad sets, SLR, heel slides, ab/add and SAQ supine    General Comments        Pertinent Vitals/Pain Pain Assessment: Faces Faces Pain Scale: Hurts little more Pain Descriptors / Indicators: Aching Pain Intervention(s): Limited activity within patient's tolerance;Monitored during session    Home Living                      Prior Function  PT Goals (current goals can now be found in the care plan section) Progress towards PT goals: Progressing toward goals    Frequency    BID      PT Plan Current plan remains appropriate    Co-evaluation              AM-PAC PT "6 Clicks" Mobility   Outcome Measure  Help needed turning from your back to your side while in a flat bed without using bedrails?: A Lot Help needed moving from lying on your back to sitting on the side of a flat bed without using bedrails?: Total Help needed moving to and from a bed to a chair (including a wheelchair)?: A Lot Help needed standing up from a chair using your arms (e.g., wheelchair or bedside chair)?: A Lot Help needed to walk in hospital room?: Total Help needed climbing 3-5 steps with a railing? : Total 6 Click Score: 9    End of Session Equipment Utilized During Treatment: Gait belt Activity Tolerance: Patient tolerated treatment well;Patient limited by fatigue Patient left: in bed;with  bed alarm set;with call bell/phone within reach Nurse Communication: Mobility status       Time: 2202-5427 PT Time Calculation (min) (ACUTE ONLY): 24 min  Charges:  $Therapeutic Exercise: 8-22 mins $Therapeutic Activity: 8-22 mins                    Danielle Dess, PTA 03/23/20, 10:02 AM

## 2020-03-23 NOTE — TOC Progression Note (Signed)
Transition of Care [facility]) - Progression Note    Patient Details  Name: Hayley Maynard MRN: [id number] Date of Birth: Apr 23, 1922  Transition of [facility]) CM/SW [facility], RN Phone Number: 03/23/2020, 9:41 AM  Clinical Narrative:    Met with the patient in the room to discuss the DC plan and needs, She is alert and oriented X 4, She is wearing O2 acutely, She stated that she knows that she needs to go to rehab and is agreeable.  She has had the Covid Vaccines.  She agrees to a bed search, FL2, PASSR completed and bed search sent, will review once offers are obtained        Expected Discharge Plan and Services                                                 Social Determinants of Health (SDOH) Interventions    Readmission Risk Interventions No flowsheet data found.

## 2020-03-23 NOTE — [place] FL2 (Signed)
[place] MEDICAID FL2 LEVEL OF CARE SCREENING TOOL     IDENTIFICATION  Patient Name: Hayley Maynard Birthdate: [birth year] Sex: female Admission Date (Current Location): 03/20/2020  [place] Number:  Chiropodist and Address:  [facility], [address], [place], [place] [address]      Provider Number: [id number]  Attending Physician Name and Address:  Alwyn Ren, MD  Relative Name and Phone Number:  Farrel Gordon [phone]    Current Level of Care: Hospital Recommended Level of Care: Skilled Nursing Facility Prior Approval Number:    Date Approved/Denied:   PASRR Number: [id number]  Discharge Plan: SNF    Current Diagnoses: Patient Active Problem List   Diagnosis Date Noted  . Displaced fracture of greater trochanter of right femur, initial encounter for closed fracture (HCC) 03/20/2020  . CAP (community acquired pneumonia) 03/20/2020  . Chronic kidney disease, stage III (moderate) 10/28/2019  . Anemia 11/08/2018  . CAD (coronary artery disease) 11/08/2018  . Mixed hyperlipidemia 11/08/2018  . Moderate mitral insufficiency 03/30/2017  . CKD (chronic kidney disease) stage 3, GFR 30-59 ml/min 01/27/2015  . LVH (left ventricular hypertrophy) due to hypertensive disease, without heart failure 01/27/2015  . Benign essential hypertension 01/09/2015    Orientation RESPIRATION BLADDER Height & Weight     Self, Time, Situation, Place  O2 (2 liters) Continent Weight: 47.2 kg Height:  5' (152.4 cm)  BEHAVIORAL SYMPTOMS/MOOD NEUROLOGICAL BOWEL NUTRITION STATUS      Incontinent Diet (Regular)  AMBULATORY STATUS COMMUNICATION OF NEEDS Skin   Extensive Assist Verbally Normal, Surgical wounds                       Personal Care Assistance Level of Assistance  Bathing, Feeding, Dressing Bathing Assistance: Limited assistance Feeding assistance: Limited assistance Dressing Assistance: Limited assistance      Functional Limitations Info  Sight, Hearing, Speech Sight Info: Adequate Hearing Info: Adequate Speech Info: Adequate    SPECIAL CARE FACTORS FREQUENCY  PT (By licensed PT), OT (By licensed OT)     PT Frequency: 5 times per week OT Frequency: 5 times per week            Contractures Contractures Info: Not present    Additional Factors Info  Code Status, Allergies Code Status Info: Full code Allergies Info: Propoxyphene, Telmisartan-hctz           Current Medications (03/23/2020):  This is the current hospital active medication list Current Facility-Administered Medications  Medication Dose Route Frequency Provider Last Rate Last Admin  . acetaminophen (TYLENOL) tablet 325-650 mg  325-650 mg Oral Q6H PRN Kennedy Bucker, MD      . alum & mag hydroxide-simeth (MAALOX/MYLANTA) 200-200-20 MG/5ML suspension 30 mL  30 mL Oral Q4H PRN Kennedy Bucker, MD      . azithromycin (ZITHROMAX) 500 mg in sodium chloride 0.9 % 250 mL IVPB  500 mg Intravenous Q24H Alwyn Ren, MD 250 mL/hr at 03/22/20 1607 500 mg at 03/22/20 1607  . bisacodyl (DULCOLAX) suppository 10 mg  10 mg Rectal Daily PRN Kennedy Bucker, MD      . brimonidine (ALPHAGAN) 0.2 % ophthalmic solution 1 drop  1 drop Both Eyes BID Opyd, Lavone Neri, MD   1 drop at 03/23/20 0926  . cefTRIAXone (ROCEPHIN) 1 g in sodium chloride 0.9 % 100 mL IVPB  1 g Intravenous Q24H Alwyn Ren, MD   Stopped at 03/22/20 2208  . Chlorhexidine Gluconate  Cloth 2 % PADS 6 each  6 each Topical Daily Kennedy Bucker, MD   6 each at 03/21/20 1022  . dorzolamide-timolol (COSOPT) 22.3-6.8 MG/ML ophthalmic solution 1 drop  1 drop Both Eyes BID Opyd, Lavone Neri, MD   1 drop at 03/23/20 0927  . enoxaparin (LOVENOX) injection 30 mg  30 mg Subcutaneous Q24H Kennedy Bucker, MD   30 mg at 03/23/20 0841  . febuxostat (ULORIC) tablet 40 mg  40 mg Oral Daily Opyd, Lavone Neri, MD   40 mg at 03/23/20 0925  . feeding supplement (ENSURE ENLIVE) (ENSURE ENLIVE)  liquid 237 mL  237 mL Oral BID BM Kennedy Bucker, MD   237 mL at 03/23/20 0927  . ferrous fumarate-b12-vitamic C-folic acid (TRINSICON / FOLTRIN) capsule 1 capsule  1 capsule Oral BID PC Kennedy Bucker, MD   1 capsule at 03/23/20 0841  . HYDROcodone-acetaminophen (NORCO) 7.5-325 MG per tablet 1-2 tablet  1-2 tablet Oral Q4H PRN Kennedy Bucker, MD      . HYDROcodone-acetaminophen (NORCO/VICODIN) 5-325 MG per tablet 1-2 tablet  1-2 tablet Oral Q4H PRN Kennedy Bucker, MD      . lactated ringers infusion   Intravenous Continuous Kennedy Bucker, MD 10 mL/hr at 03/22/20 2210 Restarted at 03/22/20 2210  . magnesium citrate solution 1 Bottle  1 Bottle Oral Once PRN Kennedy Bucker, MD      . magnesium hydroxide (MILK OF MAGNESIA) suspension 30 mL  30 mL Oral Daily PRN Kennedy Bucker, MD      . menthol-cetylpyridinium (CEPACOL) lozenge 3 mg  1 lozenge Oral PRN Kennedy Bucker, MD       Or  . phenol (CHLORASEPTIC) mouth spray 1 spray  1 spray Mouth/Throat PRN Kennedy Bucker, MD      . metoCLOPramide (REGLAN) tablet 5-10 mg  5-10 mg Oral Q8H PRN Kennedy Bucker, MD   10 mg at 03/22/20 1409   Or  . metoCLOPramide (REGLAN) injection 5-10 mg  5-10 mg Intravenous Q8H PRN Kennedy Bucker, MD      . morphine 2 MG/ML injection 0.5-1 mg  0.5-1 mg Intravenous Q2H PRN Kennedy Bucker, MD      . multivitamin with minerals tablet 1 tablet  1 tablet Oral Daily Kennedy Bucker, MD   1 tablet at 03/23/20 [phone]  . ondansetron (ZOFRAN) tablet 4 mg  4 mg Oral Q6H PRN Kennedy Bucker, MD       Or  . ondansetron [facility]) injection 4 mg  4 mg Intravenous Q6H PRN Kennedy Bucker, MD      . senna-docusate (Senokot-S) tablet 1 tablet  1 tablet Oral QHS PRN Opyd, Lavone Neri, MD      . simvastatin (ZOCOR) tablet 20 mg  20 mg Oral Daily Opyd, Lavone Neri, MD   20 mg at 03/23/20 0925  . traMADol (ULTRAM) tablet 50 mg  50 mg Oral Q6H PRN Kennedy Bucker, MD   50 mg at 03/20/20 2115     Discharge Medications: Please see discharge summary for a list of  discharge medications.  Relevant Imaging Results:  Relevant Lab Results:   Additional Information [id number], Has Been Vaccinated for Covid  Barrie Dunker, RN

## 2020-03-23 NOTE — Progress Notes (Signed)
Physical Therapy Treatment Patient Details Name: Hayley Maynard MRN: [id number] DOB: 27-Oct-1921 Today's Date: 03/23/2020    History of Present Illness Per MD note: Hayley Maynard is a 86 y.o. female with medical history significant for hypertension, chronic kidney disease stage IIIb, and coronary artery disease, now presenting to the emergency department for evaluation of falls and right hip pain.  Patient is usually able to ambulate with a walker, but seemed to be generally weak yesterday per report of her daughter, had her knees "give out" per report of the patient, leading to a fall.  This happened twice.  Patient denies hitting her head or losing consciousness but has been experiencing right hip pain when she attempts to bear weight.  Patient denies any shortness of breath, chest pain, chills, or cough, but her daughter notes that there has been a mild cough since yesterday though no apparent dyspnea    PT Comments    Pt ready for session.  Participated in exercises as described below.  To EOB and is able to sit x 20 minutes unsupported this pm.  Overall improved activity tolerance this pm.  Anticipate ability to sit in chair tomorrow.  Will continue with BID sessions.   Follow Up Recommendations  SNF     Equipment Recommendations  Rolling walker with 5" wheels    Recommendations for Other Services       Precautions / Restrictions Precautions Precautions: Fall Restrictions Weight Bearing Restrictions: Yes RLE Weight Bearing: Weight bearing as tolerated    Mobility  Bed Mobility Overal bed mobility: Needs Assistance Bed Mobility: Supine to Sit;Sit to Supine   Sidelying to sit: Mod assist Supine to sit: Mod assist Sit to supine: Mod assist      Transfers Overall transfer level: Needs assistance Equipment used: Rolling walker (2 wheeled)                Ambulation/Gait         Gait velocity: decreased       Stairs             Wheelchair Mobility     Modified Rankin (Stroke Patients Only)       Balance Overall balance assessment: Needs assistance Sitting-balance support: Bilateral upper extremity supported Sitting balance-Leahy Scale: Fair Sitting balance - Comments: able to sit unsupported this pm                                    Cognition Arousal/Alertness: Awake/alert Behavior During Therapy: WFL for tasks assessed/performed Overall Cognitive Status: Within Functional Limits for tasks assessed                                        Exercises Other Exercises Other Exercises: BLE AAROM ankle pumps, quad sets, SLR, heel slides, ab/add and SAQ supine    General Comments        Pertinent Vitals/Pain Pain Assessment: Faces Faces Pain Scale: Hurts a little bit Pain Descriptors / Indicators: Aching Pain Intervention(s): Limited activity within patient's tolerance;Premedicated before session;Repositioned    Home Living                      Prior Function            PT Goals (current goals can now be found in the care plan  section) Progress towards PT goals: Progressing toward goals    Frequency    BID      PT Plan Current plan remains appropriate    Co-evaluation              AM-PAC PT "6 Clicks" Mobility   Outcome Measure  Help needed turning from your back to your side while in a flat bed without using bedrails?: A Lot Help needed moving from lying on your back to sitting on the side of a flat bed without using bedrails?: A Lot Help needed moving to and from a bed to a chair (including a wheelchair)?: A Lot Help needed standing up from a chair using your arms (e.g., wheelchair or bedside chair)?: A Lot Help needed to walk in hospital room?: Total Help needed climbing 3-5 steps with a railing? : Total 6 Click Score: 10    End of Session Equipment Utilized During Treatment: Gait belt Activity Tolerance: Patient tolerated treatment well Patient left:  in bed;with bed alarm set;with call bell/phone within reach;with family/visitor present Nurse Communication: Mobility status       Time: 1017-5102 PT Time Calculation (min) (ACUTE ONLY): 33 min  Charges:  $Therapeutic Exercise: 8-22 mins $Therapeutic Activity: 8-22 mins                    Danielle Dess, PTA 03/23/20, 1:50 PM

## 2020-03-24 LAB — BASIC METABOLIC PANEL
Anion gap: 7 (ref 5–15)
BUN: 28 mg/dL — ABNORMAL HIGH (ref 8–23)
CO2: 29 mmol/L (ref 22–32)
Calcium: 8.6 mg/dL — ABNORMAL LOW (ref 8.9–10.3)
Chloride: 107 mmol/L (ref 98–111)
Creatinine, Ser: 1.19 mg/dL — ABNORMAL HIGH (ref 0.44–1.00)
GFR calc Af Amer: 45 mL/min — ABNORMAL LOW (ref >60)
GFR calc non Af Amer: 39 mL/min — ABNORMAL LOW (ref >60)
Glucose, Bld: 97 mg/dL (ref 70–99)
Potassium: 4.3 mmol/L (ref 3.5–5.1)
Sodium: 143 mmol/L (ref 135–145)

## 2020-03-24 LAB — CBC
HCT: 25.3 % — ABNORMAL LOW (ref 36.0–46.0)
Hemoglobin: 8 g/dL — ABNORMAL LOW (ref 12.0–15.0)
MCH: 30.8 pg (ref 26.0–34.0)
MCHC: 31.6 g/dL (ref 30.0–36.0)
MCV: 97.3 fL (ref 80.0–100.0)
Platelets: 182 10*3/uL (ref 150–400)
RBC: 2.6 MIL/uL — ABNORMAL LOW (ref 3.87–5.11)
RDW: 13.5 % (ref 11.5–15.5)
WBC: 6.6 10*3/uL (ref 4.0–10.5)
nRBC: 0 % (ref 0.0–0.2)

## 2020-03-24 MED ORDER — FE FUMARATE-B12-VIT C-FA-IFC PO CAPS: 1.0000 | ORAL_CAPSULE | Freq: Two times a day (BID) | ORAL | Status: DC

## 2020-03-24 MED ORDER — SENNOSIDES-DOCUSATE SODIUM 8.6-50 MG PO TABS: 1.0000 | ORAL_TABLET | Freq: Every evening | ORAL | Status: DC | PRN

## 2020-03-24 MED ORDER — ONDANSETRON HCL 4 MG PO TABS: 4.0000 mg | ORAL_TABLET | Freq: Four times a day (QID) | ORAL | 0 refills | Status: DC | PRN

## 2020-03-24 MED ORDER — CEFUROXIME AXETIL 250 MG PO TABS: 250.0000 mg | ORAL_TABLET | Freq: Two times a day (BID) | ORAL | 0 refills | Status: DC

## 2020-03-24 MED ORDER — BISACODYL 10 MG RE SUPP: 10.0000 mg | Freq: Every day | RECTAL | 0 refills | Status: DC | PRN

## 2020-03-24 MED ORDER — ADULT MULTIVITAMIN W/MINERALS CH: 1.0000 | ORAL_TABLET | Freq: Every day | ORAL | Status: DC

## 2020-03-24 MED ORDER — MAGNESIUM CITRATE PO SOLN: 1.0000 | Freq: Once | ORAL | Status: DC | PRN

## 2020-03-24 NOTE — TOC Progression Note (Signed)
Transition of Care [facility]) - Progression Note    Patient Details  Name: Hayley Maynard MRN: [id number] Date of Birth: 07-20-1922  [facility] of [facility] CM/SW Contact  Barrie Dunker, RN Phone Number: 03/24/2020, 10:51 AM  Clinical Narrative:   Laural Benes the patient's daughter and notified her that the patient will DC today and that Ins [employer] Harvey was approved.  DC packet placed on the chart, the Bedside nurse to call report, DC summary needed to send to [facility]          Expected Discharge Plan and Services           Expected Discharge Date: 03/24/20                                     Social Determinants of Health (SDOH) Interventions    Readmission Risk Interventions No flowsheet data found.

## 2020-03-24 NOTE — Progress Notes (Signed)
Physical Therapy Treatment Patient Details Name: Hayley Maynard MRN: [id number] DOB: [birth year] Today's Date: 03/24/2020    History of Present Illness Per MD note: Hayley Maynard is a 86 y.o. female with medical history significant for hypertension, chronic kidney disease stage IIIb, and coronary artery disease, now presenting to the emergency department for evaluation of falls and right hip pain.  Patient is usually able to ambulate with a walker, but seemed to be generally weak yesterday per report of her daughter, had her knees "give out" per report of the patient, leading to a fall.  This happened twice.  Patient denies hitting her head or losing consciousness but has been experiencing right hip pain when she attempts to bear weight.  Patient denies any shortness of breath, chest pain, chills, or cough, but her daughter notes that there has been a mild cough since yesterday though no apparent dyspnea    PT Comments    Pt was supine in bed with HOB elevated ~ 45 degrees. She is lethargic but does agree to PT session and is cooperative and pleasant throughout. Several times required cues to keep eyes open but pt fully conversational throughout. She is oriented. Required Mod A for exiting L side of bed, mod-max assist to stand, and mod assist to take ~ 3 steps to turn and sit in recliner. Overall pot tolerated session well. RN aware of pt's abilities. At conclusion of session, pt was in recliner, with chair alarm in place and call bell in reach. She will benefit from continued skilled PT to address deficits and assist pt to returning to PLOF.     Follow Up Recommendations  SNF     Equipment Recommendations  Rolling walker with 5" wheels    Recommendations for Other Services       Precautions / Restrictions Precautions Precautions: Fall Restrictions Weight Bearing Restrictions: Yes RLE Weight Bearing: Weight bearing as tolerated    Mobility  Bed Mobility Overal bed mobility: Needs  Assistance Bed Mobility: Supine to Sit   Sidelying to sit: Mod assist Supine to sit: Mod assist;HOB elevated (slightly elevated)     General bed mobility comments: Pt was able to exit L side of bed with increased time and tactle + vcs for sequencing and technique improvements. sat EOB x 5 minutes prior to standing and take steps to recliner.  Transfers Overall transfer level: Needs assistance Equipment used: Rolling walker (2 wheeled) Transfers: Sit to/from Stand Sit to Stand: From elevated surface;Mod assist;Max assist         General transfer comment: pt performed STS 2 x EOB and 1 x from recliner throughout session. she required max vcs for technique, hand placement, and improved fwd wt shift. Overall tolerated well.  Ambulation/Gait Ambulation/Gait assistance: Mod assist Gait Distance (Feet): 3 Feet Assistive device: Rolling walker (2 wheeled) Gait Pattern/deviations: Step-to pattern;Trunk flexed;Decreased step length - right;Decreased step length - left Gait velocity: decreased   General Gait Details: pt was able to pick/clear BLEs form floor to take several steps from EOB to recliner. vcs throughout for maintaining UE support on RW.   Stairs             Wheelchair Mobility    Modified Rankin (Stroke Patients Only)       Balance Overall balance assessment: Needs assistance Sitting-balance support: Bilateral upper extremity supported Sitting balance-Leahy Scale: Fair Sitting balance - Comments: pt was able to sit EOB x 5 minutes with supervision   Standing balance support: Bilateral upper extremity  supported;During functional activity Standing balance-Leahy Scale: Poor Standing balance comment: requires constant assistance and heavy UE support to maintain balance                            Cognition Arousal/Alertness: Lethargic Behavior During Therapy: WFL for tasks assessed/performed Overall Cognitive Status: Within Functional Limits for  tasks assessed                                 General Comments: Pt is oriented but is lethargic. eyes closed throughout much of session but fully participated.      Exercises      General Comments        Pertinent Vitals/Pain Pain Assessment: No/denies pain Pain Score: 0-No pain Faces Pain Scale: No hurt Pain Location: R hip with movement Pain Descriptors / Indicators: Aching Pain Intervention(s): Limited activity within patient's tolerance;Monitored during session;Premedicated before session;Repositioned    Home Living                      Prior Function            PT Goals (current goals can now be found in the care plan section) Acute Rehab PT Goals Patient Stated Goal: to be able to go home Progress towards PT goals: Progressing toward goals    Frequency    BID      PT Plan Current plan remains appropriate    Co-evaluation              AM-PAC PT "6 Clicks" Mobility   Outcome Measure  Help needed turning from your back to your side while in a flat bed without using bedrails?: A Lot Help needed moving from lying on your back to sitting on the side of a flat bed without using bedrails?: A Lot Help needed moving to and from a bed to a chair (including a wheelchair)?: A Lot Help needed standing up from a chair using your arms (e.g., wheelchair or bedside chair)?: A Lot Help needed to walk in hospital room?: A Lot Help needed climbing 3-5 steps with a railing? : Total 6 Click Score: 11    End of Session Equipment Utilized During Treatment: Gait belt Activity Tolerance: Patient tolerated treatment well Patient left: in chair;with call bell/phone within reach;with chair alarm set Nurse Communication: Mobility status PT Visit Diagnosis: Unsteadiness on feet (R26.81);Muscle weakness (generalized) (M62.81);History of falling (Z91.81);Difficulty in walking, not elsewhere classified (R26.2)     Time: 1020-1045 PT Time Calculation  (min) (ACUTE ONLY): 25 min  Charges:  $Therapeutic Activity: 23-37 mins                     Jetta Lout PTA 03/24/20, 11:02 AM

## 2020-03-24 NOTE — Progress Notes (Signed)
Report called to Kim @ [facility]  

## 2020-03-24 NOTE — Progress Notes (Signed)
EMS here to transport pt, daughter at bedside.  

## 2020-03-24 NOTE — Discharge Summary (Signed)
Physician Discharge Summary  Hayley Maynard ZOX:[id number] DOB: 10-29-1921 DOA: 03/20/2020  PCP: Jerl Mina, MD  Admit date: 03/20/2020 Discharge date: 03/24/2020  Admitted From: Home Disposition: Home Recommendations for Outpatient Follow-up:  1. Follow up with PCP in 1-2 weeks 2. Please obtain BMP/CBC in one week 3. Please follow up with orthopedics  Home Health: None Equipment/Devices: None Discharge Condition: Stable CODE STATUS: Full code Diet recommendation: Cardiac Brief/Interim Summary:Hayley B Milesis a 86 y.o.femalewith medical history significant forhypertension, chronic kidney disease stage IIIb, and coronary artery disease, now presenting to the emergency department for evaluation of falls and right hip pain. Patient is usually able to ambulate with a walker, but seemed to be generally weak yesterday per report of her daughter, had her knees "give out" per report of the patient, leading to a fall. This happened twice. Patient denies hitting her head or losing consciousness but has been experiencing right hip pain when she attempts to bear weight. Patient denies any shortness of breath, chest pain, chills, or cough, but her daughter notes that there has been a mild cough since yesterday though no apparent dyspnea.  ED Course:Upon arrival to the ED, patient is found to be afebrile, saturating mid 90s on room air, normal respirations, and stable blood pressure. EKG features a sinus rhythm with PVCs and LAD. Noncontrast head CT is negative for acute intracranial abnormality. Radiographs of the pelvis are concerning for avulsion of the right greater trochanter and CT of the right hip demonstrates comminuted impacted and mildly displaced fracture of the right greater trochanter. Chest x-ray is concerning for possible left lower lobe pneumonia and CT chest has been ordered to further evaluate for this. Chemistry panel features a creatinine 1.65 and CBC is notable for a leukocytosis  to 12,400. Troponin is mildly elevated. Orthopedic surgery was consulted by the ED physician and recommended MRI hip. Patient was given 500 cc normal saline and morphine in the ED.   Discharge Diagnoses:  Principal Problem:   Displaced fracture of greater trochanter of right femur, initial encounter for closed fracture [facility]) Active Problems:   Benign essential hypertension   CAD (coronary artery disease)   CKD (chronic kidney disease) stage 3, GFR 30-59 ml/min   CAP (community acquired pneumonia)   #1 Right greater trochanteric fracture status post mechanical fall at home.  Status post intertrochanteric intramedullary nail.  Patient will remain on Lovenox for 2 weeks.  She will follow-up with orthopedics.  Make sure she is on a stool softener at the nursing facility.  #2 community-acquired pneumonia she was treated with Rocephin and azithromycin initially.  Finish the course of antibiotics.  She is on 2 L of oxygen.  Make sure he uses incentive spirometry.    CT of the chest-patchy bibasilar atelectasis with areas of suspected consolidation/pneumonia in the anterolateral posteromedial left base regions.  No pleural effusion. Saturating 100% on 2 L nasal cannula.  #3 CKD stage IIIb stable creatinine 1.49 today.  #4 history of essential hypertension-I have continued hydralazine and stopped Norvasc due to normal to soft blood pressure.    #5 history of CAD/hyperlipidemia on Zocor  Nutrition Problem: Increased nutrient needs Etiology: hip fracture    Signs/Symptoms: estimated needs     Interventions: Ensure Enlive (each supplement provides 350kcal and 20 grams of protein), MVI  Estimated body mass index is 20.32 kg/m as calculated from the following:   Height as of this encounter: 5' (1.524 m).   Weight as of this encounter: 47.2 kg.  Discharge  Instructions  Discharge Instructions    Amb Referral to Palliative Care   Complete by: As directed    Diet - low sodium heart  healthy   Complete by: As directed    Discharge wound care:   Complete by: As directed    See orders   Increase activity slowly   Complete by: As directed      Allergies as of 03/24/2020      Reactions   Propoxyphene Other (See Comments)   Telmisartan-hctz Other (See Comments)      Medication List    STOP taking these medications   amLODipine 10 MG tablet Commonly known as: NORVASC   losartan 50 MG tablet Commonly known as: COZAAR     TAKE these medications   bisacodyl 10 MG suppository Commonly known as: DULCOLAX Place 1 suppository (10 mg total) rectally daily as needed for moderate constipation.   brimonidine 0.2 % ophthalmic solution Commonly known as: ALPHAGAN Place 1 drop into both eyes in the morning and at bedtime.   cefUROXime 250 MG tablet Commonly known as: CEFTIN Take 1 tablet (250 mg total) by mouth 2 (two) times daily with a meal.   dorzolamide-timolol 22.3-6.8 MG/ML ophthalmic solution Commonly known as: COSOPT Place 1 drop into both eyes 2 (two) times daily.   enoxaparin 40 MG/0.4ML injection Commonly known as: LOVENOX Inject 0.4 mLs (40 mg total) into the skin daily for 14 days.   febuxostat 40 MG tablet Commonly known as: ULORIC Take 40 mg by mouth daily.   ferrous fumarate-b12-vitamic C-folic acid capsule Commonly known as: TRINSICON / FOLTRIN Take 1 capsule by mouth 2 (two) times daily after a meal.   hydrALAZINE 50 MG tablet Commonly known as: APRESOLINE Take 1 tablet by mouth 2 (two) times daily.   HYDROcodone-acetaminophen 5-325 MG tablet Commonly known as: NORCO/VICODIN Take 1 tablet by mouth every 6 (six) hours as needed for moderate pain (pain score 4-6).   magnesium citrate Soln Take 296 mLs (1 Bottle total) by mouth once as needed for severe constipation.   multivitamin with minerals Tabs tablet Take 1 tablet by mouth daily. Start taking on: March 25, 2020   ondansetron 4 MG tablet Commonly known as: ZOFRAN Take 1 tablet  (4 mg total) by mouth every 6 (six) hours as needed for nausea.   senna-docusate 8.6-50 MG tablet Commonly known as: Senokot-S Take 1 tablet by mouth at bedtime as needed for mild constipation.   simvastatin 20 MG tablet Commonly known as: ZOCOR Take 20 mg by mouth daily.   traMADol 50 MG tablet Commonly known as: ULTRAM Take 1 tablet (50 mg total) by mouth every 6 (six) hours as needed for moderate pain.            Discharge Care Instructions  (From admission, onward)         Start     Ordered   03/24/20 0000  Discharge wound care:       Comments: See orders   03/24/20 1040          Contact information for follow-up providers    Evon Slack, PA-C. Go on 04/08/2020.   Specialties: Orthopedic Surgery, Emergency Medicine Why: For staple removal at 11 AM Contact information: [address] [place] [address] [phone]        Jerl Mina, MD.   Specialty: Family Medicine Contact information: [address] [place] [address] [phone]  Contact information for after-discharge care    Destination    [facility] Preferred SNF .   Service: Skilled Nursing Contact information: [address] [place] [address] [phone]                 Allergies  Allergen Reactions  . Propoxyphene Other (See Comments)  . Telmisartan-Hctz Other (See Comments)    Consultations: Orthopedics   Procedures/Studies: DG Chest 2 View  Result Date: 03/19/2020 CLINICAL DATA:  86 year old with cough and congestion. EXAM: CHEST - 2 VIEW COMPARISON:  None. FINDINGS: Post median sternotomy. Upper normal heart size. Aortic atherosclerosis and tortuosity. Increased AP diameter of the thorax. Patchy basilar opacity likely localizing to the left lower lobe. Trace pleural effusions. Mild interstitial coarsening. No pneumothorax. Bones are under mineralized. IMPRESSION: 1. Patchy left lower  lobe opacity suspicious for pneumonia in the setting of cough. 2. Trace pleural effusions. 3. Post CABG.  Aortic atherosclerosis and tortuosity. Aortic Atherosclerosis (ICD10-I70.0). Electronically Signed   By: Narda Rutherford M.D.   On: 03/19/2020 21:27   CT Head Wo Contrast  Result Date: 03/19/2020 CLINICAL DATA:  Mental status change, fall EXAM: CT HEAD WITHOUT CONTRAST TECHNIQUE: Contiguous axial images were obtained from the base of the skull through the vertex without intravenous contrast. COMPARISON:  None. FINDINGS: Brain: No evidence of acute territorial infarction, hemorrhage, hydrocephalus,extra-axial collection or mass lesion/mass effect. There is dilatation the ventricles and sulci consistent with age-related atrophy. Low-attenuation changes in the deep white matter consistent with small vessel ischemia. Vascular: No hyperdense vessel or unexpected calcification. Skull: The skull is intact. No fracture or focal lesion identified. Sinuses/Orbits: The visualized paranasal sinuses and mastoid air cells are clear. The orbits and globes intact. Other: None IMPRESSION: No acute intracranial abnormality. Findings consistent with age related atrophy and chronic small vessel ischemia Electronically Signed   By: Jonna Clark M.D.   On: 03/19/2020 23:38   CT Chest Wo Contrast  Result Date: 03/20/2020 CLINICAL DATA:  Pneumonia EXAM: CT CHEST WITHOUT CONTRAST TECHNIQUE: Multidetector CT imaging of the chest was performed following the standard protocol without IV contrast. COMPARISON:  Chest radiograph March 19, 2020 FINDINGS: Cardiovascular: There is aortic atherosclerosis. The aorta appears overall prominent. The measured diameter in the aortic arch region is 3.5 cm. There is extensive aortic atherosclerosis. There are multiple foci of calcification in visualized great vessels. There are multiple foci of native coronary artery calcification. Patient is status post coronary artery bypass grafting. There is  calcification in the mitral annulus. There is no pericardial effusion or pericardial thickening. Mediastinum/Nodes: There are subcentimeter thyroid nodular lesions. There is no dominant thyroid mass. There are occasional subcentimeter mediastinal lymph nodes which do not meet size criteria for pathologic significance. No appreciable esophageal lesions. Lungs/Pleura: There is underlying centrilobular and paraseptal emphysematous change. There is atelectatic change in the lung bases. There is mild consolidation in the posteromedial left base. A second focus of apparent consolidation is noted in the anterolateral left base. No appreciable pleural effusions. Upper Abdomen: There is extensive upper abdominal vascular atherosclerosis. There is a calcification in the region of the gallbladder fossa, a likely gallstone, incompletely visualized. Visualized upper abdominal structures otherwise appear unremarkable. Musculoskeletal: Patient is status post median sternotomy. There is degenerative change throughout the thoracic spine. There are no blastic or lytic bone lesions. No chest wall lesions evident. IMPRESSION: 1. Underlying emphysematous change. Patchy bibasilar atelectasis with areas of suspected consolidation/pneumonia in the anterolateral and posteromedial left base regions. No  similar consolidation elsewhere. No appreciable pleural effusion. 2. Extensive aortic atherosclerosis. Prominence of the thoracic aorta with measured diameter in the aortic arch of 3.5 cm. Recommend annual imaging followup by CTA or MRA if overall clinical condition so warrants. This recommendation follows 2010 ACCF/AHA/AATS/ACR/ASA/SCA/SCAI/SIR/STS/SVM Guidelines for the Diagnosis and Management of Patients with Thoracic Aortic Disease. Circulation.2010; 121: Z610-R604. Aortic aneurysm NOS (ICD10-I71.9) 3.  Status post coronary artery bypass grafting. 4. Questionable gallstone in right upper quadrant, incompletely visualized. Aortic  Atherosclerosis (ICD10-I70.0) and Emphysema (ICD10-J43.9). Electronically Signed   By: Bretta Bang III M.D.   On: 03/20/2020 05:28   CT Hip Right Wo Contrast  Result Date: 03/20/2020 CLINICAL DATA:  Fall with pain EXAM: CT OF THE RIGHT HIP WITHOUT CONTRAST TECHNIQUE: Multidetector CT imaging of the right hip was performed according to the standard protocol. Multiplanar CT image reconstructions were also generated. COMPARISON:  Radiograph same day FINDINGS: Bones/Joint/Cartilage There is a comminuted impacted mildly displaced fracture involving the greater trochanter. No other fractures are seen. There is moderate right hip osteoarthritis with superior joint space loss and marginal osteophyte formation. No large hip joint effusion. Ligaments Suboptimally assessed by CT. Muscles and Tendons The muscles surrounding the hip are intact. The tendons appear to be grossly intact. A small fatty containing lesion is seen with anterior rectus femoris musculature, likely lipoma. Soft tissues Subcutaneous edema seen over the lateral aspect of the hip. There is scattered colonic diverticula. Scattered dense vascular calcifications are noted. IMPRESSION: Comminuted impacted mildly displaced fracture of the greater trochanter. Electronically Signed   By: Jonna Clark M.D.   On: 03/20/2020 03:28   MR HIP RIGHT WO CONTRAST  Result Date: 03/20/2020 CLINICAL DATA:  Evaluate right hip fracture EXAM: MR OF THE RIGHT HIP WITHOUT CONTRAST TECHNIQUE: Multiplanar, multisequence MR imaging was performed. No intravenous contrast was administered. COMPARISON:  CT 03/20/2020.  X-ray 03/19/2020 FINDINGS: Bones: Redemonstration of a comminuted fracture of the right greater trochanter. 1.5 cm fragment is medially displaced along the posterior aspect of the femoral neck (series 11, image 13). Nondisplaced fracture lines involve the intertrochanteric region without extension into the lesser trochanter (series 4, images 14-15). Prominent  surrounding marrow edema. Fracture lines do not involve the femoral neck or femoral head. No additional fracture. No femoral head avascular necrosis. Pelvic bony ring intact. No suspicious osseous lesion. Articular cartilage and labrum Articular cartilage: No cartilage defect. No subchondral marrow signal changes. Labrum: Mild superior labral degeneration with partial-thickness fraying. Joint or bursal effusion Joint effusion:  Small hip joint effusion. Bursae: No evidence of bursitis. Muscles and tendons Muscles and tendons: Gluteus medius tendinosis (series 11, images 7-9). The gluteus medius tendon remains intact inserting onto a nondisplaced fracture component of the greater tuberosity. Gluteus minimus tendon appears intact. The hamstring, iliopsoas, rectus femoris, and adductor tendons appear intact without tear or significant tendinosis. Periarticular intramuscular edema most pronounced within the adductor compartment, likely reactive/posttraumatic. Overlying the proximal vastus lateralis muscle is a homogeneously T1 hyperintense well-defined ovoid mass with edge follows fat signal on all sequences measuring 8.1 x 1.2 x 4.1 cm (series 11, image 26; series 4, image 19). Other findings Miscellaneous: Extensive colonic diverticulosis. Degenerative disc disease within the lower lumbar spine. Nonspecific diffuse subcutaneous edema. IMPRESSION: 1. Comminuted mildly displaced fracture of the right greater trochanter, as described above. 2. Gluteus medius tendinosis. 3. 8.1 x 1.2 x 4.1 cm simple lipoma overlying the proximal vastus lateralis muscle. Electronically Signed   By: Duanne Guess D.O.   On: 03/20/2020  08:06   DG HIP OPERATIVE UNILAT W OR W/O PELVIS RIGHT  Result Date: 03/20/2020 CLINICAL DATA:  Right IM nail fracture EXAM: OPERATIVE right HIP (WITH PELVIS IF PERFORMED) 3 VIEWS TECHNIQUE: Fluoroscopic spot image(s) were submitted for interpretation post-operatively. COMPARISON:  03/19/2020 FINDINGS:  Three low resolution intraoperative spot views of the right hip. Total fluoroscopy time was 42 seconds. The images demonstrate intramedullary rod and distal screw fixation of the right femur. There is anatomic alignment. IMPRESSION: Intraoperative fluoroscopic assistance provided during surgical fixation of right femur Electronically Signed   By: Jasmine Pang M.D.   On: 03/20/2020 15:47   DG Hip Unilat W or Wo Pelvis 2-3 Views Right  Result Date: 03/19/2020 CLINICAL DATA:  Several falls today with hip pain, initial encounter EXAM: DG HIP (WITH OR WITHOUT PELVIS) 2-3V RIGHT COMPARISON:  None. FINDINGS: Pelvic ring is intact. No dislocation is noted. Some lucency is noted in the region of the right greater trochanter which may represent an undisplaced greater trochanter fracture. No definitive right femoral neck fracture is seen. No soft tissue abnormality is seen. IMPRESSION: Changes suspicious for avulsion fracture from the right greater trochanter. CT can be performed as clinically indicated. Electronically Signed   By: Alcide Clever M.D.   On: 03/19/2020 23:51    (Echo, Carotid, EGD, Colonoscopy, ERCP)    Subjective:  Patient is resting in bed in no acute distress Discharge Exam: Vitals:   03/24/20 0009 03/24/20 0801  BP: (!) 121/48 (!) 133/59  Pulse: (!) 58 79  Resp: 16 19  Temp: 98.1 F (36.7 C)   SpO2: 99% 94%   Vitals:   03/23/20 0723 03/23/20 1524 03/24/20 0009 03/24/20 0801  BP: (!) 129/86 (!) 112/50 (!) 121/48 (!) 133/59  Pulse: 62 68 (!) 58 79  Resp: 16 18 16 19   Temp: 99.1 F (37.3 C) 98.8 F (37.1 C) 98.1 F (36.7 C)   TempSrc: Oral Oral    SpO2: 99% 100% 99% 94%  Weight:      Height:        General: Pt is alert, awake, not in acute distress Cardiovascular: RRR, S1/S2 +, no rubs, no gallops Respiratory: Few scattered rhonchi bilaterally, no wheezing, no rhonchi Abdominal: Soft, NT, ND, bowel sounds + Extremities: no edema, no cyanosis    The results of  significant diagnostics from this hospitalization (including imaging, microbiology, ancillary and laboratory) are listed below for reference.     Microbiology: Recent Results (from the past 240 hour(s))  SARS Coronavirus 2 by RT PCR (hospital order, performed in [facility] hospital lab) Nasopharyngeal Nasopharyngeal Swab     Status: None   Collection Time: 03/20/20  4:18 AM   Specimen: Nasopharyngeal Swab  Result Value Ref Range Status   SARS Coronavirus 2 NEGATIVE NEGATIVE Final    Comment: (NOTE) SARS-CoV-2 target nucleic acids are NOT DETECTED.  The SARS-CoV-2 RNA is generally detectable in upper and lower respiratory specimens during the acute phase of infection. The lowest concentration of SARS-CoV-2 viral copies this assay can detect is 250 copies / mL. A negative result does not preclude SARS-CoV-2 infection and should not be used as the sole basis for treatment or other patient management decisions.  A negative result may occur with improper specimen collection / handling, submission of specimen other than nasopharyngeal swab, presence of viral mutation(s) within the areas targeted by this assay, and inadequate number of viral copies (<250 copies / mL). A negative result must be combined with clinical observations, patient history,  and epidemiological information.  Fact Sheet for Patients:   BoilerBrush.com.cyhttps://www.fda.gov/media/136312/download  Fact Sheet for Healthcare Providers: https://pope.com/https://www.fda.gov/media/136313/download  This test is not yet approved or  cleared by the [place] FDA and has been authorized for detection and/or diagnosis of SARS-CoV-2 by FDA under an Emergency Use Authorization (EUA).  This EUA will remain in effect (meaning this test can be used) for the duration of the COVID-19 declaration under Section 564(b)(1) of the Act, 21 U.S.C. section 360bbb-3(b)(1), unless the authorization is terminated or revoked sooner.  Performed at [facility], [address] [place], [place] [id number]      Labs: BNP (last 3 results) No results for input(s): BNP in the last 8760 hours. Basic Metabolic Panel: Recent Labs  Lab 03/19/20 2110 03/19/20 2110 03/20/20 1734 03/21/20 0406 03/22/20 0522 03/23/20 0452 03/24/20 0438  NA 141  --   --  141 142 142 143  K 4.2  --   --  4.3 4.0 4.1 4.3  CL 101  --   --  104 105 104 107  CO2 23  --   --  27 31 28 29   GLUCOSE 107*  --   --  104* 105* 127* 97  BUN 32*  --   --  37* 38* 33* 28*  CREATININE 1.65*   < > 1.65* 1.49* 1.52* 1.47* 1.19*  CALCIUM 9.1  --   --  7.8* 8.1* 8.3* 8.6*   < > = values in this interval not displayed.   Liver Function Tests: No results for input(s): AST, ALT, ALKPHOS, BILITOT, PROT, ALBUMIN in the last 168 hours. No results for input(s): LIPASE, AMYLASE in the last 168 hours. No results for input(s): AMMONIA in the last 168 hours. CBC: Recent Labs  Lab 03/20/20 1734 03/21/20 0406 03/22/20 0522 03/23/20 0452 03/24/20 0438  WBC 12.6* 8.8 8.2 6.7 6.6  HGB 10.0* 8.2* 8.3* 7.8* 8.0*  HCT 30.9* 26.1* 26.3* 26.3* 25.3*  MCV 96.3 98.9 96.7 100.8* 97.3  PLT 172 139* 156 170 182   Cardiac Enzymes: No results for input(s): CKTOTAL, CKMB, CKMBINDEX, TROPONINI in the last 168 hours. BNP: Invalid input(s): POCBNP CBG: Recent Labs  Lab 03/20/20 2329  GLUCAP 96   D-Dimer No results for input(s): DDIMER in the last 72 hours. Hgb A1c No results for input(s): HGBA1C in the last 72 hours. Lipid Profile No results for input(s): CHOL, HDL, LDLCALC, TRIG, CHOLHDL, LDLDIRECT in the last 72 hours. Thyroid function studies No results for input(s): TSH, T4TOTAL, T3FREE, THYROIDAB in the last 72 hours.  Invalid input(s): FREET3 Anemia work up No results for input(s): VITAMINB12, FOLATE, FERRITIN, TIBC, IRON, RETICCTPCT in the last 72 hours. Urinalysis    Component Value Date/Time   COLORURINE AMBER (A) 03/19/2020 2110   APPEARANCEUR CLOUDY (A) 03/19/2020 2110    LABSPEC 1.019 03/19/2020 2110   PHURINE 5.0 03/19/2020 2110   GLUCOSEU NEGATIVE 03/19/2020 2110   HGBUR SMALL (A) 03/19/2020 2110   BILIRUBINUR NEGATIVE 03/19/2020 2110   KETONESUR 5 (A) 03/19/2020 2110   PROTEINUR 100 (A) 03/19/2020 2110   NITRITE NEGATIVE 03/19/2020 2110   LEUKOCYTESUR LARGE (A) 03/19/2020 2110   Sepsis Labs Invalid input(s): PROCALCITONIN,  WBC,  LACTICIDVEN Microbiology Recent Results (from the past 240 hour(s))  SARS Coronavirus 2 by RT PCR (hospital order, [facility]) Nasopharyngeal Nasopharyngeal Swab     Status: None   Collection Time: 03/20/20  4:18 AM   Specimen: Nasopharyngeal Swab  Result Value Ref  Range Status   SARS Coronavirus 2 NEGATIVE NEGATIVE Final    Comment: (NOTE) SARS-CoV-2 target nucleic acids are NOT DETECTED.  The SARS-CoV-2 RNA is generally detectable in upper and lower respiratory specimens during the acute phase of infection. The lowest concentration of SARS-CoV-2 viral copies this assay can detect is 250 copies / mL. A negative result does not preclude SARS-CoV-2 infection and should not be used as the sole basis for treatment or other patient management decisions.  A negative result may occur with improper specimen collection / handling, submission of specimen other than nasopharyngeal swab, presence of viral mutation(s) within the areas targeted by this assay, and inadequate number of viral copies (<250 copies / mL). A negative result must be combined with clinical observations, patient history, and epidemiological information.  Fact Sheet for Patients:   BoilerBrush.com.cy  Fact Sheet for Healthcare Providers: https://pope.com/  This test is not yet approved or  cleared by the [place] FDA and has been authorized for detection and/or diagnosis of SARS-CoV-2 by FDA under an Emergency Use Authorization (EUA).  This EUA will remain in effect (meaning  this test can be used) for the duration of the COVID-19 declaration under Section 564(b)(1) of the Act, 21 U.S.C. section 360bbb-3(b)(1), unless the authorization is terminated or revoked sooner.  Performed at [facility], [address], [place], [place] [address]      Time coordinating discharge: 39 minutes  SIGNED:   Alwyn Ren, MD  [facility] 03/24/2020, 10:56 AM

## 2020-03-24 NOTE — TOC Progression Note (Signed)
Transition of Care [facility]) - Progression Note    Patient Details  Name: Hayley Maynard MRN: [id number] Date of Birth: [birth year]  Transition of Care [facility]) CM/SW Contact  Barrie Dunker, RN Phone Number: 03/24/2020, 12:26 PM  Clinical Narrative:      The bedside nurse called report to [facility], TOC CM called EMS to arrange non urgent transport to be picked up at 2 PM, requested by [facility], The DC packet in on the chart.      Expected Discharge Plan and Services           Expected Discharge Date: 03/24/20                                     Social Determinants of Health (SDOH) Interventions    Readmission Risk Interventions No flowsheet data found.

## 2020-03-24 NOTE — Progress Notes (Signed)
[facility], [facility] hospital Liaison note:  New referral for [employer] community Palliative program to follow at [facility] received from Palliative NP [employer]. Patient to d/c to [facility] today. Dayna Barker BSN, RN, [facility] [employer] [phone]

## 2020-03-24 NOTE — TOC Progression Note (Signed)
Transition of Care [facility]) - Progression Note    Patient Details  Name: Hayley Maynard MRN: [id number] Date of Birth: 1922-03-25  Transition of Care [facility]) CM/SW Contact  Barrie Dunker, RN Phone Number: 03/24/2020, 10:24 AM  Clinical Narrative:   Berkley Harvey approved next review date 8/5, ref number [id number], notified the physician and Thayer Ohm at [facility]         Expected Discharge Plan and Services                                                 Social Determinants of Health (SDOH) Interventions    Readmission Risk Interventions No flowsheet data found.

## 2020-04-01 ENCOUNTER — Non-Acute Institutional Stay: Payer: Medicare Other | Admitting: Primary Care

## 2020-04-01 ENCOUNTER — Other Ambulatory Visit: Payer: Self-pay

## 2020-04-01 DIAGNOSIS — I119 Hypertensive heart disease without heart failure: Secondary | ICD-10-CM

## 2020-04-01 DIAGNOSIS — Z515 Encounter for palliative care: Secondary | ICD-10-CM

## 2020-04-01 DIAGNOSIS — S72111A Displaced fracture of greater trochanter of right femur, initial encounter for closed fracture: Secondary | ICD-10-CM

## 2020-04-01 NOTE — Progress Notes (Signed)
[facility]er, [facility]ell[employer] Palliative Care Consult Note Telephone: [phone]  Fax: [phone]  PATIENT NAME: Hayley Maynard [place] [id number] [phone] (home)  DOB: 1922/03/15 MRN: [id number]  PRIMARY CARE PROVIDER:    Juluis Pitch, MD,  [address] [place] [address] [phone]  REFERRING PROVIDER:   Juluis Pitch, MD [address] [place],  [place] [address] [phone]  RESPONSIBLE PARTY:   Extended Emergency Contact Information Primary Emergency Contact: URQUHART,DARLENE Address: [place] Phone: [phone] Relation: Daughter  I met face to face with patient and family in facility. I met patient step son, and placed a call to POA. No answer, message left to return call.   ASSESSMENT AND RECOMMENDATIONS:   1. Advance Care Planning/Goals of Care: Goals include to maximize quality of life and symptom management. MOST to be obtained from SNF, not yet in system.  2. Symptom Management:   I met with [name] today she was alert and oriented x3.  She stated she had been living alone and doing her own care before her recent illness. The plan is for her to go home with her daughter in [facility],  [place] for the interim. She states a good appetite and no pain currently at rest. Her stepson is at her bedside and also defers any conversation to her power of attorney, her daughter. [name] states that she had been living by herself having some help with cleaning but generally doing all care personal care.She had people to bring her groceries and take her to appointments etc. I will continue to follow while at [facility] and would be happy to provide community palliative services should she return to our service area to live following [facility] discharge.  Patient Endorses nausea from a medication.I could not find a staff to discover which medication this was, but would recommend NOT to crush medicines as she can  take them whole. She prefers to take whole. She also wants to refuse the medication which nauseates her and recommend ATC acetaminophen in lieu of narcotics.  3. Follow up Palliative Care Visit: Palliative care will continue to follow for goals of care clarification and symptom management. Return 4 weeks or prn.  4. Family /Caregiver/Community Supports: Daughter is POA, step son visiting. Has lived (I) in community prior to illness.  5. Cognitive / Functional decline: A and O x 2-3. Has managed self care with help for IADLS in community. Here for rehab following a fx.   I spent 35 minutes providing this consultation,  from 1030 to 1105. More than 50% of the time in this consultation was spent coordinating communication.   CHIEF COMPLAINT:debility, immobility following a fx.  HISTORY OF PRESENT ILLNESS:  Hayley Maynard is a 86 y.o. year old female with multiple medical problems including   Displaced fracture of greater trochanter of right femur, initial encounter for closed fracture, Benign essential hypertension , CAD (coronary artery disease)  , CKD (chronic kidney disease) stage 3, GFR 30-59 ml/min, CAP (community acquired pneumonia). Palliative Care was asked to follow this patient by consultation request of Juluis Pitch, MD to help address advance care planning and goals of care. This is the initial visit.  CODE STATUS: FULL CODE  PPS: 50%  HOSPICE ELIGIBILITY/DIAGNOSIS: TBD  PAST MEDICAL HISTORY:  Past Medical History:  Diagnosis Date  . Arthritis    feet  . CKD (chronic kidney disease), stage III   .  Coronary artery disease   . Glaucoma   . Hyperlipidemia   . Hypertension   . LVH (left ventricular hypertrophy)   . Wears dentures    full upper and lower    SOCIAL HX:  Social History   Tobacco Use  . Smoking status: Current Some Day Smoker    Types: Cigarettes  . Smokeless tobacco: Never Used  . Tobacco comment: 1 pack last about 6 weeks  Substance Use Topics  .  Alcohol use: Not Currently   FAMILY HX: No family history on file.  ALLERGIES:  Allergies  Allergen Reactions  . Propoxyphene Other (See Comments)  . Telmisartan-Hctz Other (See Comments)     PERTINENT MEDICATIONS:  Outpatient Encounter Medications as of 04/01/2020  Medication Sig  . bisacodyl (DULCOLAX) 10 MG suppository Place 1 suppository (10 mg total) rectally daily as needed for moderate constipation.  . brimonidine (ALPHAGAN) 0.2 % ophthalmic solution Place 1 drop into both eyes in the morning and at bedtime.   . cefUROXime (CEFTIN) 250 MG tablet Take 1 tablet (250 mg total) by mouth 2 (two) times daily with a meal. (Patient taking differently: Take 250 mg by mouth 2 (two) times daily with a meal. Stop on 04/03/20)  . dorzolamide-timolol (COSOPT) 22.3-6.8 MG/ML ophthalmic solution Place 1 drop into both eyes 2 (two) times daily.  Marland Kitchen enoxaparin (LOVENOX) 40 MG/0.4ML injection Inject 0.4 mLs (40 mg total) into the skin daily for 14 days. (Patient taking differently: Inject 40 mg into the skin daily. Stop on 04/07/20)  . febuxostat (ULORIC) 40 MG tablet Take 40 mg by mouth daily.  . ferrous [id number] C-folic acid (TRINSICON / FOLTRIN) capsule Take 1 capsule by mouth 2 (two) times daily after a meal.  . hydrALAZINE (APRESOLINE) 50 MG tablet Take 1 tablet by mouth 2 (two) times daily.  Marland Kitchen HYDROcodone-acetaminophen (NORCO/VICODIN) 5-325 MG tablet Take 1 tablet by mouth every 6 (six) hours as needed for moderate pain (pain score 4-6).  . magnesium citrate SOLN Take 296 mLs (1 Bottle total) by mouth once as needed for severe constipation.  . Multiple Vitamin (MULTIVITAMIN WITH MINERALS) TABS tablet Take 1 tablet by mouth daily.  . ondansetron (ZOFRAN) 4 MG tablet Take 1 tablet (4 mg total) by mouth every 6 (six) hours as needed for nausea.  Marland Kitchen senna-docusate (SENOKOT-S) 8.6-50 MG tablet Take 1 tablet by mouth at bedtime as needed for mild constipation.  . simvastatin (ZOCOR) 20 MG tablet  Take 20 mg by mouth daily.  . traMADol (ULTRAM) 50 MG tablet Take 1 tablet (50 mg total) by mouth every 6 (six) hours as needed for moderate pain.   No facility-administered encounter medications on file as of 04/01/2020.     PHYSICAL EXAM / ROS:   Current and past weights: 104 lb  reported, BMI 20.3 General: NAD, frail appearing, thin Cardiovascular: no chest pain reported, no LE  edema  Pulmonary: no cough, no increased SOB, oxygen at 2 L/ [place] Abdomen: appetite fair, getting supplements, denies constipation, continent of bowel GU: denies dysuria, incontinent of urine at times MSK:  ++ joint and ROM abnormalities, requires assist to get oob Skin: l elbow skin tear, R hip surgical wound Neurological: Weakness, a and o x 3, denies pain currently  Jason Coop, NP , DNP, MPH, [facility]  COVID-19 PATIENT SCREENING TOOL  Person answering questions: ____________staff______ _____   1.  Is the patient or any family member in the home showing any signs or symptoms regarding respiratory infection?  Person with Symptom- __________NA_________________  a. Fever                                                                          Yes___ No___          ___________________  b. Shortness of breath                                                    Yes___ No___          ___________________ c. Cough/congestion                                       Yes___  No___         ___________________ d. Body aches/pains                                                         Yes___ No___        ____________________ e. Gastrointestinal symptoms (diarrhea, nausea)           Yes___ No___        ____________________  2. Within the past 14 days, has anyone living in the home had any contact with someone with or under investigation for COVID-19?    Yes___ No_X_   Person __________________   

## 2020-04-02 ENCOUNTER — Telehealth: Payer: Self-pay | Admitting: Primary Care

## 2020-04-02 NOTE — Telephone Encounter (Signed)
Talked to daughter RE pt disposition. She will be going to her home in [facility] on Tuesday . She would like to pursue home health rehab. We discussed her oxygen which may be for her latest inpt episode. I recommended to try to wean and have comfort as a goal. They can get home oxygen and DMe as needed to facilitate care. I referred her to a palliative provider in her area as she will be outside the [facility] service areas.

## 2020-05-04 ENCOUNTER — Encounter: Payer: Self-pay | Admitting: Podiatry

## 2020-05-04 ENCOUNTER — Ambulatory Visit (INDEPENDENT_AMBULATORY_CARE_PROVIDER_SITE_OTHER): Payer: Medicare Other | Admitting: Podiatry

## 2020-05-04 ENCOUNTER — Other Ambulatory Visit: Payer: Self-pay

## 2020-05-04 DIAGNOSIS — B351 Tinea unguium: Secondary | ICD-10-CM

## 2020-05-04 DIAGNOSIS — N183 Chronic kidney disease, stage 3 unspecified: Secondary | ICD-10-CM

## 2020-05-04 DIAGNOSIS — M79676 Pain in unspecified toe(s): Secondary | ICD-10-CM

## 2020-05-04 NOTE — Progress Notes (Signed)
This patient returns to my office for at risk foot care.  This patient requires this care by a professional since this patient will be at risk due to having chronic kidney disease.  This patient is unable to cut nails herself since the patient cannot reach her nails.These nails are painful walking and wearing shoes.  This patient presents for at risk foot care today.  General Appearance  Alert, conversant and in no acute stress.  Vascular  Dorsalis pedis and posterior tibial  pulses are weakly palpable  Right.  Dorsalis pedis and posterior tibial pulses absent left foot.  Capillary return is within normal limits  bilaterally. Cpld feet. bilaterally.  Neurologic  Senn-Weinstein monofilament wire test within normal limits  bilaterally. Muscle power within normal limits bilaterally.  Nails Thick disfigured discolored nails with subungual debris  from hallux to fifth toes bilaterally. No evidence of bacterial infection or drainage bilaterally.  Orthopedic  No limitations of motion  feet .  No crepitus or effusions noted.  HAV  B/L.  Contracted digits  B/L especially second digit left foot.  Skin  normotropic skin with no porokeratosis noted bilaterally.  No signs of infections or ulcers noted.     Onychomycosis  Pain in right toes  Pain in left toes  Consent was obtained for treatment procedures.   Mechanical debridement of nails 1-5  bilaterally performed with a nail nipper.  Filed with dremel without incident.    Return office visit   3 months                   Told patient to return for periodic foot care and evaluation due to potential at risk complications.   Helane Gunther DPM

## 2020-08-06 ENCOUNTER — Encounter: Payer: Self-pay | Admitting: Podiatry

## 2020-08-06 ENCOUNTER — Ambulatory Visit (INDEPENDENT_AMBULATORY_CARE_PROVIDER_SITE_OTHER): Payer: Medicare Other | Admitting: Podiatry

## 2020-08-06 ENCOUNTER — Other Ambulatory Visit: Payer: Self-pay

## 2020-08-06 DIAGNOSIS — B351 Tinea unguium: Secondary | ICD-10-CM

## 2020-08-06 DIAGNOSIS — M79676 Pain in unspecified toe(s): Secondary | ICD-10-CM | POA: Diagnosis not present

## 2020-08-06 DIAGNOSIS — N183 Chronic kidney disease, stage 3 unspecified: Secondary | ICD-10-CM

## 2020-08-06 NOTE — Progress Notes (Signed)
This patient returns to my office for at risk foot care.  This patient requires this care by a professional since this patient will be at risk due to having chronic kidney disease.  This patient is unable to cut nails herself since the patient cannot reach her nails.These nails are painful walking and wearing shoes.  This patient presents for at risk foot care today.  General Appearance  Alert, conversant and in no acute stress.  Vascular  Dorsalis pedis and posterior tibial  pulses are weakly palpable  Right.  Dorsalis pedis and posterior tibial pulses absent left foot.  Capillary return is within normal limits  bilaterally. Cpld feet. bilaterally.  Neurologic  Senn-Weinstein monofilament wire test within normal limits  bilaterally. Muscle power within normal limits bilaterally.  Nails Thick disfigured discolored nails with subungual debris  from hallux to fifth toes bilaterally. No evidence of bacterial infection or drainage bilaterally.  Orthopedic  No limitations of motion  feet .  No crepitus or effusions noted.  HAV  B/L.  Contracted digits  B/L especially second digit left foot.  Skin  normotropic skin with no porokeratosis noted bilaterally.  No signs of infections or ulcers noted.  Heloma durum second toe left foot.   Onychomycosis  Pain in right toes  Pain in left toes  Consent was obtained for treatment procedures.   Mechanical debridement of nails 1-5  bilaterally performed with a nail nipper.  Filed with dremel without incident.  Iatrogenic left hallux.  DSD applied.   Return office visit   3 months                   Told patient to return for periodic foot care and evaluation due to potential at risk complications.   Helane Gunther DPM

## 2020-11-09 ENCOUNTER — Other Ambulatory Visit: Payer: Self-pay

## 2020-11-09 ENCOUNTER — Ambulatory Visit (INDEPENDENT_AMBULATORY_CARE_PROVIDER_SITE_OTHER): Payer: Medicare Other | Admitting: Podiatry

## 2020-11-09 ENCOUNTER — Encounter: Payer: Self-pay | Admitting: Podiatry

## 2020-11-09 DIAGNOSIS — M2041 Other hammer toe(s) (acquired), right foot: Secondary | ICD-10-CM

## 2020-11-09 DIAGNOSIS — M79676 Pain in unspecified toe(s): Secondary | ICD-10-CM | POA: Diagnosis not present

## 2020-11-09 DIAGNOSIS — N183 Chronic kidney disease, stage 3 unspecified: Secondary | ICD-10-CM

## 2020-11-09 DIAGNOSIS — B351 Tinea unguium: Secondary | ICD-10-CM | POA: Diagnosis not present

## 2020-11-09 DIAGNOSIS — N1832 Chronic kidney disease, stage 3b: Secondary | ICD-10-CM | POA: Diagnosis not present

## 2020-11-09 DIAGNOSIS — L989 Disorder of the skin and subcutaneous tissue, unspecified: Secondary | ICD-10-CM

## 2020-11-09 DIAGNOSIS — M2042 Other hammer toe(s) (acquired), left foot: Secondary | ICD-10-CM

## 2020-11-09 NOTE — Progress Notes (Signed)
This patient returns to my office for at risk foot care.  This patient requires this care by a professional since this patient will be at risk due to having chronic kidney disease.  This patient is unable to cut nails herself since the patient cannot reach her nails.These nails are painful walking and wearing shoes. She presents to the office with her daughter. This patient presents for at risk foot care today.  General Appearance  Alert, conversant and in no acute stress.  Vascular  Dorsalis pedis and posterior tibial  pulses are weakly palpable  Right.  Dorsalis pedis and posterior tibial pulses absent left foot.  Capillary return is within normal limits  bilaterally. Cold feet. bilaterally.  Neurologic  Senn-Weinstein monofilament wire test within normal limits  bilaterally. Muscle power within normal limits bilaterally.  Nails Thick disfigured discolored nails with subungual debris  from hallux to fifth toes bilaterally. No evidence of bacterial infection or drainage bilaterally.  Orthopedic  No limitations of motion  feet .  No crepitus or effusions noted.  HAV  B/L.  Contracted digits  B/L especially second digit left foot.  Skin  normotropic skin with no porokeratosis noted bilaterally.  No signs of infections or ulcers noted.  Heloma durum second toe left foot.   Onychomycosis  Pain in right toes  Pain in left toes  Consent was obtained for treatment procedures.   Mechanical debridement of nails 1-5  bilaterally performed with a nail nipper.  Filed with dremel without incident. Filed heloma durum  with dremel tool. Iatrogenic lesion.  Neosporin/DSD applied.   Return office visit   3 months                   Told patient to return for periodic foot care and evaluation due to potential at risk complications.   Helane Gunther DPM

## 2020-11-26 ENCOUNTER — Other Ambulatory Visit: Payer: Self-pay

## 2020-11-26 ENCOUNTER — Encounter (INDEPENDENT_AMBULATORY_CARE_PROVIDER_SITE_OTHER): Payer: Self-pay | Admitting: Vascular Surgery

## 2020-11-26 ENCOUNTER — Ambulatory Visit (INDEPENDENT_AMBULATORY_CARE_PROVIDER_SITE_OTHER): Payer: Medicare Other | Admitting: Vascular Surgery

## 2020-11-26 VITALS — BP 150/79 | HR 75 | Ht 62.0 in | Wt 122.0 lb

## 2020-11-26 DIAGNOSIS — I890 Lymphedema, not elsewhere classified: Secondary | ICD-10-CM | POA: Diagnosis not present

## 2020-11-26 DIAGNOSIS — M79669 Pain in unspecified lower leg: Secondary | ICD-10-CM | POA: Diagnosis not present

## 2020-11-26 DIAGNOSIS — I872 Venous insufficiency (chronic) (peripheral): Secondary | ICD-10-CM | POA: Diagnosis not present

## 2020-11-26 DIAGNOSIS — I10 Essential (primary) hypertension: Secondary | ICD-10-CM | POA: Diagnosis not present

## 2020-11-26 DIAGNOSIS — I2510 Atherosclerotic heart disease of native coronary artery without angina pectoris: Secondary | ICD-10-CM

## 2020-11-26 DIAGNOSIS — M7989 Other specified soft tissue disorders: Secondary | ICD-10-CM | POA: Insufficient documentation

## 2020-11-26 DIAGNOSIS — E782 Mixed hyperlipidemia: Secondary | ICD-10-CM

## 2020-11-26 NOTE — Progress Notes (Signed)
MRN : [id number]  Hayley Maynard is a 87 y.o. (15-Oct-1921) female who presents with chief complaint of No chief complaint on file. Marland Kitchen  History of Present Illness:   Patient is seen for evaluation of leg pain and leg swelling. The patient first noticed the swelling remotely. The swelling is associated with pain and discoloration. The pain and swelling worsens with prolonged dependency and improves with elevation. The pain is unrelated to activity.  The patient notes that in the morning the legs are significantly improved but they steadily worsened throughout the course of the day. The patient also notes a steady worsening of the discoloration in the ankle and shin area.   The patient denies claudication symptoms.  The patient denies symptoms consistent with rest pain.  The patient denies and extensive history of DJD and LS spine disease.  The patient has no had any past angiography, interventions or vascular surgery.  Elevation makes the leg symptoms better, dependency makes them much worse. There is no history of ulcerations. The patient denies any recent changes in medications.  The patient has not been wearing graduated compression.  The patient denies a history of DVT or PE. There is no prior history of phlebitis. There is no history of primary lymphedema.  No history of malignancies. No history of trauma or groin or pelvic surgery. There is no history of radiation treatment to the groin or pelvis  The patient denies amaurosis fugax or recent TIA symptoms. There are no recent neurological changes noted. The patient denies recent episodes of angina or shortness of breath  No outpatient medications have been marked as taking for the 11/26/20 encounter (Appointment) with Gilda Crease, Latina Craver, MD.    Past Medical History:  Diagnosis Date  . Arthritis    feet  . CKD (chronic kidney disease), stage III (HCC)   . Coronary artery disease   . Glaucoma   . Hyperlipidemia   . Hypertension    . LVH (left ventricular hypertrophy)   . Wears dentures    full upper and lower    Past Surgical History:  Procedure Laterality Date  . ABDOMINAL HYSTERECTOMY    . CATARACT EXTRACTION W/ INTRAOCULAR LENS  IMPLANT, BILATERAL    . CORONARY ARTERY BYPASS GRAFT     over 10 yrs ago (per pt)  . INTRAMEDULLARY (IM) NAIL INTERTROCHANTERIC Right 03/20/2020   Procedure: INTRAMEDULLARY (IM) NAIL INTERTROCHANTRIC;  Surgeon: Kennedy Bucker, MD;  Location: [facility];  Service: Orthopedics;  Laterality: Right;  . PHOTOCOAGULATION WITH LASER Right 09/26/2018   Procedure: PHOTOCOAGULATION WITH LASER;  Surgeon: Lockie Mola, MD;  Location: [facility];  Service: Ophthalmology;  Laterality: Right;  laser settings: , 31.3% duty cycle, 120 seconds    Social History Social History   Tobacco Use  . Smoking status: Current Some Day Smoker    Types: Cigarettes  . Smokeless tobacco: Never Used  . Tobacco comment: 1 pack last about 6 weeks  Vaping Use  . Vaping Use: Never used  Substance Use Topics  . Alcohol use: Not Currently    Family History No family history of bleeding/clotting disorders, porphyria or autoimmune disease   Allergies  Allergen Reactions  . Propoxyphene Other (See Comments)  . Telmisartan-Hctz Other (See Comments)     REVIEW OF SYSTEMS (Negative unless checked)  Constitutional: [] Weight loss  [] Fever  [] Chills Cardiac: [] Chest pain   [] Chest pressure   [] Palpitations   [] Shortness of breath when laying flat   [] Shortness of breath with  exertion. Vascular:  [] Pain in legs with walking   [x] Pain in legs at rest  [] History of DVT   [] Phlebitis   [x] Swelling in legs   [x] Varicose veins   [] Non-healing ulcers Pulmonary:   [] Uses home oxygen   [] Productive cough   [] Hemoptysis   [] Wheeze  [] COPD   [] Asthma Neurologic:  [] Dizziness   [] Seizures   [] History of stroke   [] History of TIA  [] Aphasia   [] Vissual changes   [] Weakness or numbness in arm   [] Weakness or  numbness in leg Musculoskeletal:   [] Joint swelling   [x] Joint pain   [] Low back pain Hematologic:  [] Easy bruising  [] Easy bleeding   [] Hypercoagulable state   [] Anemic Gastrointestinal:  [] Diarrhea   [] Vomiting  [] Gastroesophageal reflux/heartburn   [] Difficulty swallowing. Genitourinary:  [] Chronic kidney disease   [] Difficult urination  [] Frequent urination   [] Blood in urine Skin:  [] Rashes   [] Ulcers  Psychological:  [] History of anxiety   []  History of major depression.  Physical Examination  There were no vitals filed for this visit. There is no height or weight on file to calculate BMI. Gen: WD/WN, NAD seen in a wheelchair Head: [place]/AT, No temporalis wasting.  Ear/Nose/Throat: Hearing grossly intact, nares w/o erythema or drainage, poor dentition Eyes: PER, EOMI, sclera nonicteric.  Neck: Supple, no masses.  No bruit or JVD.  Pulmonary:  Good air movement, clear to auscultation bilaterally, no use of accessory muscles.  Cardiac: RRR, normal S1, S2, no Murmurs. Vascular: scattered varicosities present bilaterally.  Severe venous stasis changes to the legs bilaterally.  2-3+ hard non-pitting edema with marked inflammatory skin changes Vessel Right Left  Radial Palpable Palpable  PT Not Palpable Not Palpable  DP Not Palpable Not Palpable  Gastrointestinal: soft, non-distended. No guarding/no peritoneal signs.  Musculoskeletal: M/S 5/5 throughout.  No deformity or atrophy.  Neurologic: CN 2-12 intact. Pain and light touch intact in extremities.  Symmetrical.  Speech is fluent. Motor exam as listed above. Psychiatric: Judgment intact, Mood & affect appropriate for pt's clinical situation. Dermatologic: Severe venous rashes no ulcers noted.  No changes consistent with cellulitis. Lymph : + lichenification / skin changes of chronic lymphedema.  CBC Lab Results  Component Value Date   WBC 6.6 03/24/2020   HGB 8.0 (L) 03/24/2020   HCT 25.3 (L) 03/24/2020   MCV 97.3 03/24/2020    PLT 182 03/24/2020    BMET    Component Value Date/Time   NA 143 03/24/2020 0438   K 4.3 03/24/2020 0438   CL 107 03/24/2020 0438   CO2 29 03/24/2020 0438   GLUCOSE 97 03/24/2020 0438   BUN 28 (H) 03/24/2020 0438   CREATININE 1.19 (H) 03/24/2020 0438   CALCIUM 8.6 (L) 03/24/2020 0438   GFRNONAA 39 (L) 03/24/2020 0438   GFRAA 45 (L) 03/24/2020 0438   CrCl cannot be calculated (Patient's most recent lab result is older than the maximum 21 days allowed.).  COAG No results found for: INR, PROTIME  Radiology No results found.   Assessment/Plan 1. Pain and swelling of lower leg, unspecified laterality  Recommend:  The patient has atypical pain symptoms for pure atherosclerotic disease. However, on physical exam there is evidence of mixed venous and arterial disease, given the diminished pulses and the edema associated with venous changes of the legs.  Noninvasive studies including ABI's and venous ultrasound of the legs will be obtained and the patient will follow up with me to review these studies.  I suspect the patient is  c/o pseudoclaudication.  Patient should have an evaluation of his LS spine which I defer to the primary service.  The patient should continue walking and begin a more formal exercise program. The patient should continue his antiplatelet therapy and aggressive treatment of the lipid abnormalities.  The patient should begin wearing graduated compression socks 15-20 mmHg strength to control edema.   - VAS [place] ABI WITH/WO TBI; Future - VAS [place] LOWER EXTREMITY VENOUS (DVT); Future  2. Chronic venous insufficiency  Recommend:  The patient has atypical pain symptoms for pure atherosclerotic disease. However, on physical exam there is evidence of mixed venous and arterial disease, given the diminished pulses and the edema associated with venous changes of the legs.  Noninvasive studies including ABI's and venous ultrasound of the legs will be obtained and the  patient will follow up with me to review these studies.  I suspect the patient is c/o pseudoclaudication.  Patient should have an evaluation of his LS spine which I defer to the primary service.  The patient should continue walking and begin a more formal exercise program. The patient should continue his antiplatelet therapy and aggressive treatment of the lipid abnormalities.  The patient should begin wearing graduated compression socks 15-20 mmHg strength to control edema.   3. Lymphedema  Recommend:  The patient has atypical pain symptoms for pure atherosclerotic disease. However, on physical exam there is evidence of mixed venous and arterial disease, given the diminished pulses and the edema associated with venous changes of the legs.  Noninvasive studies including ABI's and venous ultrasound of the legs will be obtained and the patient will follow up with me to review these studies.  I suspect the patient is c/o pseudoclaudication.  Patient should have an evaluation of his LS spine which I defer to the primary service.  The patient should continue walking and begin a more formal exercise program. The patient should continue his antiplatelet therapy and aggressive treatment of the lipid abnormalities.  The patient should begin wearing graduated compression socks 15-20 mmHg strength to control edema.   4. Benign essential hypertension Continue antihypertensive medications as already ordered, these medications have been reviewed and there are no changes at this time.   5. Mixed hyperlipidemia Continue statin as ordered and reviewed, no changes at this time   6. Coronary artery disease involving native heart, unspecified vessel or lesion type, unspecified whether angina present Continue cardiac and antihypertensive medications as already ordered and reviewed, no changes at this time.  Continue statin as ordered and reviewed, no changes at this time  Nitrates PRN for chest  pain     Levora Dredge, MD  11/26/2020 11:13 AM

## 2020-12-11 ENCOUNTER — Ambulatory Visit (INDEPENDENT_AMBULATORY_CARE_PROVIDER_SITE_OTHER): Payer: Medicare Other | Admitting: Nurse Practitioner

## 2020-12-11 ENCOUNTER — Encounter (INDEPENDENT_AMBULATORY_CARE_PROVIDER_SITE_OTHER): Payer: Self-pay | Admitting: Nurse Practitioner

## 2020-12-11 ENCOUNTER — Other Ambulatory Visit: Payer: Self-pay

## 2020-12-11 ENCOUNTER — Ambulatory Visit (INDEPENDENT_AMBULATORY_CARE_PROVIDER_SITE_OTHER): Payer: Medicare Other

## 2020-12-11 VITALS — BP 161/72 | HR 80 | Resp 16

## 2020-12-11 DIAGNOSIS — I10 Essential (primary) hypertension: Secondary | ICD-10-CM

## 2020-12-11 DIAGNOSIS — I890 Lymphedema, not elsewhere classified: Secondary | ICD-10-CM

## 2020-12-11 DIAGNOSIS — R600 Localized edema: Secondary | ICD-10-CM | POA: Diagnosis not present

## 2020-12-11 DIAGNOSIS — M79662 Pain in left lower leg: Secondary | ICD-10-CM

## 2020-12-11 DIAGNOSIS — I119 Hypertensive heart disease without heart failure: Secondary | ICD-10-CM

## 2020-12-11 DIAGNOSIS — I739 Peripheral vascular disease, unspecified: Secondary | ICD-10-CM

## 2020-12-11 DIAGNOSIS — M79669 Pain in unspecified lower leg: Secondary | ICD-10-CM

## 2020-12-12 ENCOUNTER — Encounter (INDEPENDENT_AMBULATORY_CARE_PROVIDER_SITE_OTHER): Payer: Self-pay | Admitting: Nurse Practitioner

## 2020-12-12 NOTE — Progress Notes (Signed)
Subjective:    Patient ID: Hayley Maynard, female    DOB: 1922-02-21, 87 y.o.   MRN: [id number] Chief Complaint  Patient presents with  . Follow-up    Ultrasound follow up    Hayley Maynard is a 87 year old female that presents today for follow-up regarding lower extremity edema and pain.  The patient notes that her swelling is mostly gone and this is due to using fluid pill for several days.  The patient has virtually no edema near the ankles and lower legs.  She notes that swelling she feels is somewhat behind her knees.  The patient also notes that she does not have any claudication-like symptoms.  She has occasional stinging sensations that last for seconds before going away.  These episodes are very infrequent.  She denies having any true rest pain like symptoms.  She denies any wounds on her lower extremities.  She has any fever, chills nausea or vomiting.  Overall the patient's legs seem to be much improved today.  Today noninvasive studies show ABIs of 0.65 on the right and 0.61 on the left.  The patient has strong monophasic tibial artery waveforms bilaterally with strong toe waveforms bilaterally.  Patient also with a bilateral lower extremity venous reflux study.  No evidence of DVT.  No evidence of deep venous insufficiency seen bilaterally.  No evidence of superficial thrombophlebitis seen.  Patient has a small area of superficial venous reflux in the right lower extremity in the great saphenous vein at the proximal thigh.   Review of Systems  Cardiovascular: Positive for leg swelling.  Neurological: Positive for weakness.  All other systems reviewed and are negative.      Objective:   Physical Exam Vitals reviewed.  HENT:     Head: Normocephalic.  Cardiovascular:     Rate and Rhythm: Normal rate.     Pulses: Decreased pulses.  Pulmonary:     Effort: Pulmonary effort is normal.  Musculoskeletal:     Right lower leg: No edema.     Left lower leg: No edema.  Neurological:      Mental Status: She is alert and oriented to person, place, and time.     Motor: Weakness present.     Gait: Gait abnormal.  Psychiatric:        Mood and Affect: Mood normal.        Behavior: Behavior normal.        Thought Content: Thought content normal.        Judgment: Judgment normal.     BP (!) 161/72 (BP Location: Left Arm)   Pulse 80   Resp 16   Past Medical History:  Diagnosis Date  . Arthritis    feet  . CKD (chronic kidney disease), stage III (HCC)   . Coronary artery disease   . Glaucoma   . Hyperlipidemia   . Hypertension   . LVH (left ventricular hypertrophy)   . Wears dentures    full upper and lower    Social History   Socioeconomic History  . Marital status: Widowed    Spouse name: Not on file  . Number of children: Not on file  . Years of education: Not on file  . Highest education level: Not on file  Occupational History  . Not on file  Tobacco Use  . Smoking status: Former Smoker    Types: Cigarettes    Quit date: 02/20/2020    Years since quitting: 0.8  . Smokeless tobacco: Never Used  .  Tobacco comment: 1 pack last about 6 weeks  Vaping Use  . Vaping Use: Never used  Substance and Sexual Activity  . Alcohol use: Not Currently  . Drug use: Not on file  . Sexual activity: Not on file  Other Topics Concern  . Not on file  Social History Narrative  . Not on file   Social Determinants of Health   Financial Resource Strain: Not on file  Food Insecurity: Not on file  Transportation Needs: Not on file  Physical Activity: Not on file  Stress: Not on file  Social Connections: Not on file  Intimate Partner Violence: Not on file    Past Surgical History:  Procedure Laterality Date  . ABDOMINAL HYSTERECTOMY    . CATARACT EXTRACTION W/ INTRAOCULAR LENS  IMPLANT, BILATERAL    . CORONARY ARTERY BYPASS GRAFT     over 10 yrs ago (per pt)  . INTRAMEDULLARY (IM) NAIL INTERTROCHANTERIC Right 03/20/2020   Procedure: INTRAMEDULLARY (IM) NAIL  INTERTROCHANTRIC;  Surgeon: Kennedy Bucker, MD;  Location: [facility];  Service: Orthopedics;  Laterality: Right;  . PHOTOCOAGULATION WITH LASER Right 09/26/2018   Procedure: PHOTOCOAGULATION WITH LASER;  Surgeon: Lockie Mola, MD;  Location: [facility];  Service: Ophthalmology;  Laterality: Right;  laser settings: , 31.3% duty cycle, 120 seconds    History reviewed. No pertinent family history.  Allergies  Allergen Reactions  . Propoxyphene Other (See Comments)  . Telmisartan-Hctz Other (See Comments)    CBC Latest Ref Rng & Units 03/24/2020 03/23/2020 03/22/2020  WBC 4.0 - 10.5 K/uL 6.6 6.7 8.2  Hemoglobin 12.0 - 15.0 g/dL 8.0(L) 7.8(L) 8.3(L)  Hematocrit 36.0 - 46.0 % 25.3(L) 26.3(L) 26.3(L)  Platelets 150 - 400 K/uL 182 170 156      CMP     Component Value Date/Time   NA 143 03/24/2020 0438   K 4.3 03/24/2020 0438   CL 107 03/24/2020 0438   CO2 29 03/24/2020 0438   GLUCOSE 97 03/24/2020 0438   BUN 28 (H) 03/24/2020 0438   CREATININE 1.19 (H) 03/24/2020 0438   CALCIUM 8.6 (L) 03/24/2020 0438   GFRNONAA 39 (L) 03/24/2020 0438   GFRAA 45 (L) 03/24/2020 0438     No results found.     Assessment & Plan:   1. Lymphedema No surgery or intervention at this point in time.  I have reviewed my discussion with the patient regarding venous insufficiency and why it causes symptoms. I have discussed with the patient the chronic skin changes that accompany venous insufficiency and the long term sequela such as ulceration. Patient will contnue wearing graduated compression stockings on a daily basis, as this has provided excellent control of his edema. The patient will put the stockings on first thing in the morning and removing them in the evening. The patient is reminded not to sleep in the stockings.  In addition, behavioral modification including elevation during the day will be initiated.  2. LVH (left ventricular hypertrophy) due to hypertensive disease, without  heart failure This may be contributing to the patient's edema as well however it is noted that the fluid pills are helpful for her.  3. Benign essential hypertension Continue antihypertensive medications as already ordered, these medications have been reviewed and there are no changes at this time.   4. PAD (peripheral artery disease) (HCC) While the patient does have diminished ABIs she currently does not have any symptoms.  She has no claudication, rest pain or wounds or ulcerations.  Due to her advanced  age we will proceed conservatively and have the patient return in 6 months for noninvasive studies.  The patient and family advised that if she begins to have rest pain or new wounds ulcerations or discoloration, she should contact her office for immediate follow-up.   Current Outpatient Medications on File Prior to Visit  Medication Sig Dispense Refill  . amLODipine (NORVASC) 10 MG tablet Take by mouth.    . brimonidine (ALPHAGAN) 0.2 % ophthalmic solution Place 1 drop into both eyes in the morning and at bedtime.     . dorzolamide-timolol (COSOPT) 22.3-6.8 MG/ML ophthalmic solution Place 1 drop into both eyes 2 (two) times daily.    . febuxostat (ULORIC) 40 MG tablet Take 40 mg by mouth daily.    . ferrous fumarate-b12-vitamic C-folic acid (TRINSICON / FOLTRIN) capsule Take 1 capsule by mouth 2 (two) times daily after a meal.    . furosemide (LASIX) 20 MG tablet TAKE 1 TABLET(20 MG) BY MOUTH EVERY DAY FOR 1 TO 3 DAYS AS NEEDED FOR SWELLING    . hydrALAZINE (APRESOLINE) 50 MG tablet Take 1 tablet by mouth 2 (two) times daily.    Marland Kitchen HYDROcodone-acetaminophen (NORCO/VICODIN) 5-325 MG tablet Take 1 tablet by mouth every 6 (six) hours as needed for moderate pain (pain score 4-6). 30 tablet 0  . latanoprost (XALATAN) 0.005 % ophthalmic solution     . losartan (COZAAR) 50 MG tablet Take by mouth.    . magnesium citrate SOLN Take 296 mLs (1 Bottle total) by mouth once as needed for severe  constipation. 195 mL   . Multiple Vitamin (MULTIVITAMIN WITH MINERALS) TABS tablet Take 1 tablet by mouth daily.    . simvastatin (ZOCOR) 20 MG tablet Take 20 mg by mouth daily.    . traMADol (ULTRAM) 50 MG tablet Take 1 tablet (50 mg total) by mouth every 6 (six) hours as needed for moderate pain. 30 tablet 0  . bisacodyl (DULCOLAX) 10 MG suppository Place 1 suppository (10 mg total) rectally daily as needed for moderate constipation. 12 suppository 0  . enoxaparin (LOVENOX) 40 MG/0.4ML injection Inject 0.4 mLs (40 mg total) into the skin daily for 14 days. (Patient taking differently: Inject 40 mg into the skin daily. Stop on 04/07/20) 5.6 mL 0  . ondansetron (ZOFRAN) 4 MG tablet Take 1 tablet (4 mg total) by mouth every 6 (six) hours as needed for nausea. 20 tablet 0  . senna-docusate (SENOKOT-S) 8.6-50 MG tablet Take 1 tablet by mouth at bedtime as needed for mild constipation.     No current facility-administered medications on file prior to visit.    There are no Patient Instructions on file for this visit. No follow-ups on file.   Hayley Spinner, NP

## 2021-02-11 ENCOUNTER — Ambulatory Visit (INDEPENDENT_AMBULATORY_CARE_PROVIDER_SITE_OTHER): Payer: Medicare Other | Admitting: Podiatry

## 2021-02-11 ENCOUNTER — Encounter: Payer: Self-pay | Admitting: Podiatry

## 2021-02-11 ENCOUNTER — Other Ambulatory Visit: Payer: Self-pay

## 2021-02-11 DIAGNOSIS — L989 Disorder of the skin and subcutaneous tissue, unspecified: Secondary | ICD-10-CM | POA: Diagnosis not present

## 2021-02-11 DIAGNOSIS — M2041 Other hammer toe(s) (acquired), right foot: Secondary | ICD-10-CM | POA: Diagnosis not present

## 2021-02-11 DIAGNOSIS — B351 Tinea unguium: Secondary | ICD-10-CM | POA: Diagnosis not present

## 2021-02-11 DIAGNOSIS — M2042 Other hammer toe(s) (acquired), left foot: Secondary | ICD-10-CM

## 2021-02-11 DIAGNOSIS — N183 Chronic kidney disease, stage 3 unspecified: Secondary | ICD-10-CM | POA: Diagnosis not present

## 2021-02-11 DIAGNOSIS — M79676 Pain in unspecified toe(s): Secondary | ICD-10-CM

## 2021-02-11 NOTE — Progress Notes (Signed)
This patient returns to my office for at risk foot care.  This patient requires this care by a professional since this patient will be at risk due to having chronic kidney disease.  This patient is unable to cut nails herself since the patient cannot reach her nails.These nails are painful walking and wearing shoes. She presents to the office with her daughter. This patient presents for at risk foot care today.  General Appearance  Alert, conversant and in no acute stress.  Vascular  Dorsalis pedis and posterior tibial  pulses are weakly palpable  Right.  Dorsalis pedis and posterior tibial pulses absent left foot.  Capillary return is within normal limits  bilaterally. Cold feet. bilaterally.  Neurologic  Senn-Weinstein monofilament wire test within normal limits  bilaterally. Muscle power within normal limits bilaterally.  Nails Thick disfigured discolored nails with subungual debris  from hallux to fifth toes bilaterally. No evidence of bacterial infection or drainage bilaterally.  Orthopedic  No limitations of motion  feet .  No crepitus or effusions noted.  HAV  B/L.  Contracted digits  B/L especially second digit left foot.  Skin  normotropic skin with no porokeratosis noted bilaterally.  No signs of infections or ulcers noted.  Heloma durum second toe left foot covered with necrotic tissue.  Onychomycosis  Pain in right toes  Pain in left toes  Consent was obtained for treatment procedures.   Mechanical debridement of nails 1-5  bilaterally performed with a nail nipper.  Filed with dremel without incident. Filed heloma durum  with dremel tool. Iatrogenic lesion.  Neosporin/DSD applied.   Return office visit   3 months                   Told patient to return for periodic foot care and evaluation due to potential at risk complications.   Helane Gunther DPM

## 2021-03-01 ENCOUNTER — Other Ambulatory Visit: Payer: Self-pay

## 2021-03-01 ENCOUNTER — Emergency Department
Admission: EM | Admit: 2021-03-01 | Discharge: 2021-03-01 | Disposition: A | Discharge status: Home / Self Care | Payer: Medicare Other | Source: Home / Self Care | Attending: Emergency Medicine | Admitting: Emergency Medicine

## 2021-03-01 ENCOUNTER — Ambulatory Visit: Payer: Self-pay | Admitting: Surgery

## 2021-03-01 ENCOUNTER — Emergency Department: Payer: Medicare Other

## 2021-03-01 DIAGNOSIS — I2510 Atherosclerotic heart disease of native coronary artery without angina pectoris: Secondary | ICD-10-CM | POA: Insufficient documentation

## 2021-03-01 DIAGNOSIS — N183 Chronic kidney disease, stage 3 unspecified: Secondary | ICD-10-CM | POA: Insufficient documentation

## 2021-03-01 DIAGNOSIS — Z951 Presence of aortocoronary bypass graft: Secondary | ICD-10-CM | POA: Insufficient documentation

## 2021-03-01 DIAGNOSIS — K8000 Calculus of gallbladder with acute cholecystitis without obstruction: Secondary | ICD-10-CM | POA: Diagnosis not present

## 2021-03-01 DIAGNOSIS — K810 Acute cholecystitis: Secondary | ICD-10-CM

## 2021-03-01 DIAGNOSIS — R1011 Right upper quadrant pain: Secondary | ICD-10-CM

## 2021-03-01 DIAGNOSIS — R778 Other specified abnormalities of plasma proteins: Secondary | ICD-10-CM

## 2021-03-01 DIAGNOSIS — I129 Hypertensive chronic kidney disease with stage 1 through stage 4 chronic kidney disease, or unspecified chronic kidney disease: Secondary | ICD-10-CM | POA: Insufficient documentation

## 2021-03-01 DIAGNOSIS — Z20822 Contact with and (suspected) exposure to covid-19: Secondary | ICD-10-CM | POA: Insufficient documentation

## 2021-03-01 DIAGNOSIS — Z87891 Personal history of nicotine dependence: Secondary | ICD-10-CM | POA: Insufficient documentation

## 2021-03-01 DIAGNOSIS — Z79899 Other long term (current) drug therapy: Secondary | ICD-10-CM | POA: Insufficient documentation

## 2021-03-01 LAB — URINALYSIS, COMPLETE (UACMP) WITH MICROSCOPIC
Bacteria, UA: NONE SEEN
Bilirubin Urine: NEGATIVE
Glucose, UA: NEGATIVE mg/dL
Hgb urine dipstick: NEGATIVE
Ketones, ur: NEGATIVE mg/dL
Leukocytes,Ua: NEGATIVE
Nitrite: NEGATIVE
Protein, ur: 100 mg/dL — AB
Specific Gravity, Urine: 1.015 (ref 1.005–1.030)
Squamous Epithelial / HPF: NONE SEEN (ref 0–5)
pH: 5 (ref 5.0–8.0)

## 2021-03-01 LAB — CBC
HCT: 39.2 % (ref 36.0–46.0)
Hemoglobin: 12.4 g/dL (ref 12.0–15.0)
MCH: 29.2 pg (ref 26.0–34.0)
MCHC: 31.6 g/dL (ref 30.0–36.0)
MCV: 92.5 fL (ref 80.0–100.0)
Platelets: 214 10*3/uL (ref 150–400)
RBC: 4.24 MIL/uL (ref 3.87–5.11)
RDW: 13.9 % (ref 11.5–15.5)
WBC: 15 10*3/uL — ABNORMAL HIGH (ref 4.0–10.5)
nRBC: 0 % (ref 0.0–0.2)

## 2021-03-01 LAB — COMPREHENSIVE METABOLIC PANEL
ALT: 7 U/L (ref 0–44)
AST: 21 U/L (ref 15–41)
Albumin: 3.8 g/dL (ref 3.5–5.0)
Alkaline Phosphatase: 77 U/L (ref 38–126)
Anion gap: 10 (ref 5–15)
BUN: 23 mg/dL (ref 8–23)
CO2: 30 mmol/L (ref 22–32)
Calcium: 9.2 mg/dL (ref 8.9–10.3)
Chloride: 101 mmol/L (ref 98–111)
Creatinine, Ser: 1.27 mg/dL — ABNORMAL HIGH (ref 0.44–1.00)
GFR, Estimated: 39 mL/min — ABNORMAL LOW (ref >60)
Glucose, Bld: 112 mg/dL — ABNORMAL HIGH (ref 70–99)
Potassium: 4.4 mmol/L (ref 3.5–5.1)
Sodium: 141 mmol/L (ref 135–145)
Total Bilirubin: 1 mg/dL (ref 0.3–1.2)
Total Protein: 7.7 g/dL (ref 6.5–8.1)

## 2021-03-01 LAB — TROPONIN I (HIGH SENSITIVITY)
Troponin I (High Sensitivity): 24 ng/L — ABNORMAL HIGH (ref <18)
Troponin I (High Sensitivity): 31 ng/L — ABNORMAL HIGH (ref <18)

## 2021-03-01 LAB — RESP PANEL BY RT-PCR (FLU A&B, COVID) ARPGX2
Influenza A by PCR: NEGATIVE
Influenza B by PCR: NEGATIVE
SARS Coronavirus 2 by RT PCR: NEGATIVE

## 2021-03-01 LAB — LIPASE, BLOOD: Lipase: 32 U/L (ref 11–51)

## 2021-03-01 LAB — LACTIC ACID, PLASMA: Lactic Acid, Venous: 1.7 mmol/L (ref 0.5–1.9)

## 2021-03-01 MED ORDER — LACTATED RINGERS IV BOLUS
500.0000 mL | Freq: Once | INTRAVENOUS | Status: AC
  Administered 2021-03-01: 500 mL via INTRAVENOUS

## 2021-03-01 MED ORDER — AMOXICILLIN-POT CLAVULANATE 875-125 MG PO TABS: 1.0000 | ORAL_TABLET | Freq: Two times a day (BID) | ORAL | 0 refills | Status: DC

## 2021-03-01 MED ORDER — HYDROCODONE-ACETAMINOPHEN 5-325 MG PO TABS: 1.0000 | ORAL_TABLET | Freq: Four times a day (QID) | ORAL | 0 refills | Status: DC | PRN

## 2021-03-01 MED ORDER — ONDANSETRON HCL 4 MG/2ML IJ SOLN
4.0000 mg | Freq: Once | INTRAMUSCULAR | Status: AC
  Administered 2021-03-01: 4 mg via INTRAVENOUS
  Filled 2021-03-01: qty 2

## 2021-03-01 MED ORDER — ONDANSETRON HCL 4 MG PO TABS: 4.0000 mg | ORAL_TABLET | Freq: Three times a day (TID) | ORAL | 0 refills | Status: DC | PRN

## 2021-03-01 MED ORDER — PIPERACILLIN-TAZOBACTAM 3.375 G IVPB 30 MIN
3.3750 g | Freq: Once | INTRAVENOUS | Status: AC
  Administered 2021-03-01: 3.375 g via INTRAVENOUS
  Filled 2021-03-01: qty 50

## 2021-03-01 MED ORDER — FENTANYL CITRATE (PF) 100 MCG/2ML IJ SOLN
50.0000 ug | Freq: Once | INTRAMUSCULAR | Status: AC
  Administered 2021-03-01: 50 ug via INTRAVENOUS
  Filled 2021-03-01: qty 2

## 2021-03-01 NOTE — ED Provider Notes (Signed)
[facility] Emergency Department Provider Note  ____________________________________________   Event Date/Time   First MD Initiated Contact with Patient 03/01/21 1201     (approximate)  I have reviewed the triage vital signs and the nursing notes.   HISTORY  Chief Complaint Abdominal Pain   HPI Hayley Maynard is a 87 y.o. female with past medical history of CAD, CKD, HTN, HDL, LVH and arthritis who presents accompanied by daughter for assessment of approximately 2 days of some right upper quadrant abdominal pain associate with nonbloody nonbilious vomiting.  No significant diarrhea, urinary symptoms, back pain, chest pain, cough, shortness of breath, headache, earache, sore throat or any other clear acute sick symptoms.  Patient states that she had some pain in her right upper quadrant last week that seem to get better on its own.  She states her pain comes and goes and sometimes feels like it radiates to the left side of her belly.  States he has not been able to take her medicines in about 2 or 3 days.  No other acute concerns at this time.         Past Medical History:  Diagnosis Date   Arthritis    feet   CKD (chronic kidney disease), stage III ([place])    Coronary artery disease    Glaucoma    Hyperlipidemia    Hypertension    LVH (left ventricular hypertrophy)    Wears dentures    full upper and lower    Patient Active Problem List   Diagnosis Date Noted   Chronic venous insufficiency 11/26/2020   Lymphedema 11/26/2020   Pain and swelling of lower leg 11/26/2020   Hammer toes, bilateral 11/09/2020   Displaced fracture of greater trochanter of right femur, initial encounter for closed fracture ([place]) 03/20/2020   CAP (community acquired pneumonia) 03/20/2020   Bilateral carotid artery stenosis 02/19/2020   Chronic kidney disease, stage III (moderate) ([place]) 10/28/2019   Anemia 11/08/2018   CAD (coronary artery disease) 11/08/2018   Mixed  hyperlipidemia 11/08/2018   Moderate mitral insufficiency 03/30/2017   CKD (chronic kidney disease) stage 3, GFR 30-59 ml/min 01/27/2015   LVH (left ventricular hypertrophy) due to hypertensive disease, without heart failure 01/27/2015   Benign essential hypertension 01/09/2015    Past Surgical History:  Procedure Laterality Date   ABDOMINAL HYSTERECTOMY     CATARACT EXTRACTION W/ INTRAOCULAR LENS  IMPLANT, BILATERAL     CORONARY ARTERY BYPASS GRAFT     over 10 yrs ago (per pt)   INTRAMEDULLARY (IM) NAIL INTERTROCHANTERIC Right 03/20/2020   Procedure: INTRAMEDULLARY (IM) NAIL INTERTROCHANTRIC;  Surgeon: Hessie Knows, MD;  Location: [facility];  Service: Orthopedics;  Laterality: Right;   PHOTOCOAGULATION WITH LASER Right 09/26/2018   Procedure: PHOTOCOAGULATION WITH LASER;  Surgeon: Leandrew Koyanagi, MD;  Location: [place];  Service: Ophthalmology;  Laterality: Right;  laser settings: 2022m, 31.3% duty cycle, 120 seconds    Prior to Admission medications   Medication Sig Start Date End Date Taking? Authorizing Provider  amoxicillin-clavulanate (AUGMENTIN) 875-125 MG tablet Take 1 tablet by mouth 2 (two) times daily for 14 days. 03/01/21 03/15/21 Yes SLucrezia Starch MD  HYDROcodone-acetaminophen (NORCO) 5-325 MG tablet Take 1 tablet by mouth every 6 (six) hours as needed for up to 3 days for severe pain. 03/01/21 03/04/21 Yes SLucrezia Starch MD  ondansetron (ZOFRAN) 4 MG tablet Take 1 tablet (4 mg total) by mouth every 8 (eight) hours as needed for up to  10 doses for nausea or vomiting. 03/01/21  Yes Lucrezia Starch, MD  amLODipine (NORVASC) 10 MG tablet Take by mouth. 04/28/20   [provider]  bisacodyl (DULCOLAX) 10 MG suppository Place 1 suppository (10 mg total) rectally daily as needed for moderate constipation. 03/24/20   Georgette Shell, MD  brimonidine (ALPHAGAN) 0.2 % ophthalmic solution Place 1 drop into both eyes in the morning and at bedtime.      [provider]  dorzolamide-timolol (COSOPT) 22.3-6.8 MG/ML ophthalmic solution Place 1 drop into both eyes 2 (two) times daily. 04/29/19   [provider]  febuxostat (ULORIC) 40 MG tablet Take 40 mg by mouth daily.    [provider]  ferrous [id number] C-folic acid (TRINSICON / FOLTRIN) capsule Take 1 capsule by mouth 2 (two) times daily after a meal. 03/24/20   Georgette Shell, MD  furosemide (LASIX) 20 MG tablet TAKE 1 TABLET(20 MG) BY MOUTH EVERY DAY FOR 1 TO 3 DAYS AS NEEDED FOR SWELLING 11/16/20   [provider]  hydrALAZINE (APRESOLINE) 50 MG tablet Take 1 tablet by mouth 2 (two) times daily. 09/02/19   [provider]  latanoprost (XALATAN) 0.005 % ophthalmic solution  04/28/20   [provider]  losartan (COZAAR) 50 MG tablet Take by mouth. 04/28/20   [provider]  magnesium citrate SOLN Take 296 mLs (1 Bottle total) by mouth once as needed for severe constipation. 03/24/20   Georgette Shell, MD  Multiple Vitamin (MULTIVITAMIN WITH MINERALS) TABS tablet Take 1 tablet by mouth daily. 03/25/20   Georgette Shell, MD  senna-docusate (SENOKOT-S) 8.6-50 MG tablet Take 1 tablet by mouth at bedtime as needed for mild constipation. 03/24/20   Georgette Shell, MD  simvastatin (ZOCOR) 20 MG tablet Take 20 mg by mouth daily.    [provider]    Allergies Propoxyphene and Telmisartan-hctz  No family history on file.  Social History Social History   Tobacco Use   Smoking status: Former    Pack years: 0.00    Types: Cigarettes    Quit date: 02/20/2020    Years since quitting: 1.0   Smokeless tobacco: Never   Tobacco comments:    1 pack last about 6 weeks  Vaping Use   Vaping Use: Never used  Substance Use Topics   Alcohol use: Not Currently   Drug use: Not Currently    Review of Systems  Review of Systems  Constitutional:  Negative for chills and fever.  HENT:  Negative for sore throat.    Eyes:  Negative for pain.  Respiratory:  Negative for cough and stridor.   Cardiovascular:  Negative for chest pain.  Gastrointestinal:  Positive for abdominal pain, nausea and vomiting.  Genitourinary:  Negative for dysuria.  Musculoskeletal:  Negative for myalgias.  Skin:  Negative for rash.  Neurological:  Negative for seizures, loss of consciousness and headaches.  Psychiatric/Behavioral:  Negative for suicidal ideas.   All other systems reviewed and are negative.    ____________________________________________   PHYSICAL EXAM:  VITAL SIGNS: ED Triage Vitals [03/01/21 1126]  Enc Vitals Group     BP (!) 161/86     Pulse Rate 81     Resp 17     Temp 99.8 F (37.7 C)     Temp Source Oral     SpO2 93 %     Weight 124 lb (56.2 kg)     Height 5' (1.524 m)  Head Circumference      Peak Flow      Pain Score 5     Pain Loc      Pain Edu?      Excl. in [place]?    Vitals:   03/01/21 1330 03/01/21 1400  BP:  (!) 173/76  Pulse: 76 70  Resp:  18  Temp:    SpO2: 100% 99%   Physical Exam Vitals and nursing note reviewed.  Constitutional:      General: She is not in acute distress.    Appearance: She is well-developed. She is ill-appearing.  HENT:     Head: Normocephalic and atraumatic.     Right Ear: External ear normal.     Left Ear: External ear normal.     Nose: Nose normal.     Mouth/Throat:     Mouth: Mucous membranes are dry.  Eyes:     Conjunctiva/sclera: Conjunctivae normal.  Cardiovascular:     Rate and Rhythm: Normal rate and regular rhythm.     Heart sounds: No murmur heard. Pulmonary:     Effort: Pulmonary effort is normal. No respiratory distress.     Breath sounds: Normal breath sounds.  Abdominal:     Palpations: Abdomen is soft.     Tenderness: There is abdominal tenderness in the right upper quadrant and epigastric area.  Musculoskeletal:     Cervical back: Neck supple.  Skin:    General: Skin is warm and dry.     Capillary Refill:  Capillary refill takes 2 to 3 seconds.  Neurological:     Mental Status: She is alert and oriented to person, place, and time.  Psychiatric:        Mood and Affect: Mood normal.     ____________________________________________   LABS (all labs ordered are listed, but only abnormal results are displayed)  Labs Reviewed  COMPREHENSIVE METABOLIC PANEL - Abnormal; Notable for the following components:      Result Value   Glucose, Bld 112 (*)    Creatinine, Ser 1.27 (*)    GFR, Estimated 39 (*)    All other components within normal limits  CBC - Abnormal; Notable for the following components:   WBC 15.0 (*)    All other components within normal limits  TROPONIN I (HIGH SENSITIVITY) - Abnormal; Notable for the following components:   Troponin I (High Sensitivity) 24 (*)    All other components within normal limits  TROPONIN I (HIGH SENSITIVITY) - Abnormal; Notable for the following components:   Troponin I (High Sensitivity) 31 (*)    All other components within normal limits  RESP PANEL BY RT-PCR (FLU A&B, COVID) ARPGX2  LIPASE, BLOOD  LACTIC ACID, PLASMA  URINALYSIS, COMPLETE (UACMP) WITH MICROSCOPIC   ____________________________________________  EKG  Sinus rhythm with a ventricular rate of 85, right axis deviation, unremarkable intervals and T wave inversions in inferior and lateral leads nonspecific changes in the anterior leads concerning for ischemia. ____________________________________________  RADIOLOGY  ED MD interpretation: Right upper quadrant ultrasound remarkable for cholelithiasis with wall thickening and trace.  Cholecystic fluid.  This concerning for acute cholecystitis.  Common bile duct is within normal limits.  Official radiology report(s): US ABDOMEN LIMITED RUQ (LIVER/GB)  Result Date: 03/01/2021 CLINICAL DATA:  Right upper quadrant pain for 2 weeks. EXAM: ULTRASOUND ABDOMEN LIMITED RIGHT UPPER QUADRANT COMPARISON:  None. FINDINGS: Gallbladder: Large  shadowing echogenic stone measures 3.6 cm. Associated wall thickening, 4 mm, and possible trace pericholecystic fluid. Negative sonographic Percell Miller  sign. Common bile duct: Diameter: 5 mm, within normal limits. Liver: No focal lesion identified. Within normal limits in parenchymal echogenicity. Portal vein is patent on color Doppler imaging with normal direction of blood flow towards the liver. Other: Incidental imaging of the right kidney shows increased parenchymal echogenicity. IMPRESSION: 1. Cholelithiasis with wall thickening and trace pericholecystic fluid. Findings are indicative of chronic cholecystitis in the presence of a negative sonographic Murphy sign. 2. Increased parenchymal echogenicity the right kidney can be seen with chronic medical renal disease. Electronically Signed   By: Lorin Picket M.D.   On: 03/01/2021 14:31    ____________________________________________   PROCEDURES  Procedure(s) performed (including Critical Care):  .1-3 Lead EKG Interpretation  Date/Time: 03/01/2021 3:17 PM Performed by: Lucrezia Starch, MD Authorized by: Lucrezia Starch, MD     Interpretation: normal     ECG rate assessment: normal     Rhythm: sinus rhythm     Ectopy: none     Conduction: normal     ____________________________________________   INITIAL IMPRESSION / ASSESSMENT AND PLAN / ED COURSE     Patient presents with above-stated history exam for assessment of approximately 2 days of reported abdominal pain associate with nausea and vomiting.  On arrival she is hypertensive with otherwise stable vital signs on room air.  She is tender in her epigastric and right upper quadrant.  Differentials with possible atypical presentation for ACS, acute cholecystitis, cystitis and cholangitis, pancreatitis, diverticulitis and acute gastritis.  Right upper quadrant ultrasound remarkable for cholelithiasis with wall thickening and trace.  Cholecystic fluid.  This concerning for acute  cholecystitis.  Common bile duct is within normal limits.  ECG has some nonspecific findings and troponin is noted to be slightly elevated 24 and 31 compared to 26 and 32 last year I suspect this represents a mild demand ischemia in the setting of acute cholecystitis.  Lower suspicion for cholangitis given common bile duct is within normal notes.  In addition a CMP shows no evidence of hepatitis and a normal bilirubin.  Alk phos is also within normal limits.  CBC shows WBC count of 15 with normal hemoglobin and platelets.  Lactic acid is not elevated at 1.7 overall Evalose patient for sepsis at this time.  Lipase of 32 is not consistent with acute pancreatitis.  We will plan to send UA to assess for UA.  Given concern for acute cholecystitis on-call surgeon Dr. Lysle Pearl consulted.  After bedside consult with surgeon patient states she would like to leave [place] for possible surgery tomorrow.  I was unable convince her to stay after myself and Dr. Nicki Reaper explain risks of life-threatening progression of acute cholecystitis including life-threatening infection and further strain on the heart and other organs.  However I think she has capacity make this decision and she was discharged against my advice.  Rx written for Zofran, Augmentin and Norco.  Plan is to follow-up with Dr. Lysle Pearl tomorrow.        ____________________________________________   FINAL CLINICAL IMPRESSION(S) / ED DIAGNOSES  Final diagnoses:  RUQ pain  Acute cholecystitis    Medications  piperacillin-tazobactam (ZOSYN) IVPB 3.375 g (has no administration in time range)  ondansetron (ZOFRAN) injection 4 mg (4 mg Intravenous Given 03/01/21 1240)  fentaNYL (SUBLIMAZE) injection 50 mcg (50 mcg Intravenous Given 03/01/21 1240)  lactated ringers bolus 500 mL (0 mLs Intravenous Stopped 03/01/21 1416)     ED Discharge Orders          Ordered  amoxicillin-clavulanate (AUGMENTIN) 875-125 MG tablet  2 times daily         03/01/21 1509    HYDROcodone-acetaminophen (NORCO) 5-325 MG tablet  Every 6 hours PRN        03/01/21 1510    ondansetron (ZOFRAN) 4 MG tablet  Every 8 hours PRN        03/01/21 1511             Note:  This document was prepared using Dragon voice recognition software and may include unintentional dictation errors.    Lucrezia Starch, MD 03/01/21 [phone]

## 2021-03-01 NOTE — ED Triage Notes (Signed)
Pt  is here with daughter, states she was sent from [facility] with c/o RUQ pain with N/V for the past couple of days.

## 2021-03-01 NOTE — H&P (Signed)
Subjective:    CC: Acute cholecystitis   HPI:  Hayley Maynard is a 87 y.o. female who is consulted by Katrinka Blazing for evaluation of above cc.  Symptoms were first noted a few weeks ago. Pain is sharp, episodic, right upper quadrant, waxing and waning.  Associated with nausea vomiting, exacerbated by nothing specific.   Currently pain is less severe due to IV medication use.  She otherwise denies any other GI issues as noted in the review of systems below.     Past Medical History:  has a past medical history of Arthritis, CKD (chronic kidney disease), stage III (HCC), Coronary artery disease, Glaucoma, Hyperlipidemia, Hypertension, LVH (left ventricular hypertrophy), and Wears dentures.   Past Surgical History:  has a past surgical history that includes Coronary artery bypass graft; Cataract extraction w/ intraocular lens  implant, bilateral; Abdominal hysterectomy; Photocoagulation with laser (Right, 09/26/2018); and Intramedullary (im) nail intertrochanteric (Right, 03/20/2020).   Family History: family history is not on file.   Social History:  reports that she quit smoking about a year ago. Her smoking use included cigarettes. She has never used smokeless tobacco. She reports previous alcohol use. She reports previous drug use.   Current Medications:         Prior to Admission medications  Medication Sig Start Date End Date Taking? Authorizing Provider  amoxicillin-clavulanate (AUGMENTIN) 875-125 MG tablet Take 1 tablet by mouth 2 (two) times daily for 14 days. 03/01/21 03/15/21 Yes Gilles Chiquito, MD  HYDROcodone-acetaminophen (NORCO) 5-325 MG tablet Take 1 tablet by mouth every 6 (six) hours as needed for up to 3 days for severe pain. 03/01/21 03/04/21 Yes Gilles Chiquito, MD  ondansetron (ZOFRAN) 4 MG tablet Take 1 tablet (4 mg total) by mouth every 8 (eight) hours as needed for up to 10 doses for nausea or vomiting. 03/01/21   Yes Gilles Chiquito, MD  amLODipine (NORVASC) 10 MG tablet Take by  mouth. 04/28/20     [provider]  bisacodyl (DULCOLAX) 10 MG suppository Place 1 suppository (10 mg total) rectally daily as needed for moderate constipation. 03/24/20     Alwyn Ren, MD  brimonidine (ALPHAGAN) 0.2 % ophthalmic solution Place 1 drop into both eyes in the morning and at bedtime.       [provider]  dorzolamide-timolol (COSOPT) 22.3-6.8 MG/ML ophthalmic solution Place 1 drop into both eyes 2 (two) times daily. 04/29/19     [provider]  febuxostat (ULORIC) 40 MG tablet Take 40 mg by mouth daily.       [provider]  ferrous fumarate-b12-vitamic C-folic acid (TRINSICON / FOLTRIN) capsule Take 1 capsule by mouth 2 (two) times daily after a meal. 03/24/20     Alwyn Ren, MD  furosemide (LASIX) 20 MG tablet TAKE 1 TABLET(20 MG) BY MOUTH EVERY DAY FOR 1 TO 3 DAYS AS NEEDED FOR SWELLING 11/16/20     [provider]  hydrALAZINE (APRESOLINE) 50 MG tablet Take 1 tablet by mouth 2 (two) times daily. 09/02/19     [provider]  latanoprost (XALATAN) 0.005 % ophthalmic solution   04/28/20     [provider]  losartan (COZAAR) 50 MG tablet Take by mouth. 04/28/20     [provider]  magnesium citrate SOLN Take 296 mLs (1 Bottle total) by mouth once as needed for severe constipation. 03/24/20     Alwyn Ren, MD  Multiple Vitamin (MULTIVITAMIN WITH MINERALS) TABS tablet Take 1 tablet  by mouth daily. 03/25/20     Alwyn Ren, MD  senna-docusate (SENOKOT-S) 8.6-50 MG tablet Take 1 tablet by mouth at bedtime as needed for mild constipation. 03/24/20     Alwyn Ren, MD  simvastatin (ZOCOR) 20 MG tablet Take 20 mg by mouth daily.       [provider]      Allergies:       Allergies as of 03/01/2021 - Review Complete 03/01/2021  Allergen Reaction Noted   Propoxyphene Other (See Comments) 11/08/2018   Telmisartan-hctz Other (See Comments) 02/10/2014      ROS:  General:  Denies weight loss, weight gain, fatigue, fevers, chills, and night sweats. Eyes: Denies blurry vision, double vision, eye pain, itchy eyes, and tearing. Ears: Denies hearing loss, earache, and ringing in ears. Nose: Denies sinus pain, congestion, infections, runny nose, and nosebleeds. Mouth/throat: Denies hoarseness, sore throat, bleeding gums, and difficulty swallowing. Heart: Denies chest pain, palpitations, racing heart, irregular heartbeat, leg pain or swelling, and decreased activity tolerance. Respiratory: Denies breathing difficulty, shortness of breath, wheezing, cough, and sputum. GI: Denies change in appetite, heartburn, constipation, diarrhea, and blood in stool. GU: Denies difficulty urinating, pain with urinating, urgency, frequency, blood in urine. Musculoskeletal: Denies joint stiffness, pain, swelling, muscle weakness. Skin: Denies rash, itching, mass, tumors, sores, and boils Neurologic: Denies headache, fainting, dizziness, seizures, numbness, and tingling. Psychiatric: Denies depression, anxiety, difficulty sleeping, and memory loss. Endocrine: Denies heat or cold intolerance, and increased thirst or urination. Blood/lymph: Denies easy bruising, and swollen glands       Objective:    BP (!) 170/73   Pulse 74   Temp 98.8 F (37.1 C) (Oral)   Resp 18   Ht 5' (1.524 m)   Wt 56.2 kg   SpO2 99%   BMI 24.22 kg/m      Constitutional :  alert, cooperative, appears stated age, and no distress  Lymphatics/Throat:  no asymmetry, masses, or scars  Respiratory:  clear to auscultation bilaterally  Cardiovascular:  regular rate and rhythm  Gastrointestinal: Soft, no guarding, focal tenderness to palpation in right upper quadrant , less so in right lower quadrant and epigastric  Musculoskeletal: Steady movement  Skin: Cool and moist, no surgical scars  Psychiatric: Normal affect, non-agitated, not confused         LABS:  CMP Latest Ref Rng & Units 03/01/2021 03/24/2020  03/23/2020  Glucose 70 - 99 mg/dL 063(K) 97 160(F)  BUN 8 - 23 mg/dL 23 09(N) 23(F)  Creatinine 0.44 - 1.00 mg/dL 5.73(U) 2.02(R) 4.27(C)  Sodium 135 - 145 mmol/L 141 143 142  Potassium 3.5 - 5.1 mmol/L 4.4 4.3 4.1  Chloride 98 - 111 mmol/L 101 107 104  CO2 22 - 32 mmol/L 30 29 28   Calcium 8.9 - 10.3 mg/dL 9.2 ) 8.3(L)  Total Protein 6.5 - 8.1 g/dL 7.7 - -  Total Bilirubin 0.3 - 1.2 mg/dL 1.0 - -  Alkaline Phos 38 - 126 U/L 77 - -  AST 15 - 41 U/L 21 - -  ALT 0 - 44 U/L 7 - -    CBC Latest Ref Rng & Units 03/01/2021 03/24/2020 03/23/2020  WBC 4.0 - 10.5 K/uL 15.0(H) 6.6 6.7  Hemoglobin 12.0 - 15.0 g/dL 05/23/2020 8.0(L) 7.8(L)  Hematocrit 36.0 - 46.0 % 39.2 25.3(L) 26.3(L)  Platelets 150 - 400 K/uL 214 182 170        RADS: CLINICAL DATA:  Right upper quadrant pain for 2 weeks.  EXAM: ULTRASOUND ABDOMEN LIMITED RIGHT UPPER QUADRANT   COMPARISON:  None.   FINDINGS: Gallbladder:   Large shadowing echogenic stone measures 3.6 cm. Associated wall thickening, 4 mm, and possible trace pericholecystic fluid. Negative sonographic Murphy sign.   Common bile duct:   Diameter: 5 mm, within normal limits.   Liver:   No focal lesion identified. Within normal limits in parenchymal echogenicity. Portal vein is patent on color Doppler imaging with normal direction of blood flow towards the liver.   Other: Incidental imaging of the right kidney shows increased parenchymal echogenicity.   IMPRESSION: 1. Cholelithiasis with wall thickening and trace pericholecystic fluid. Findings are indicative of chronic cholecystitis in the presence of a negative sonographic Murphy sign. 2. Increased parenchymal echogenicity the right kidney can be seen with chronic medical renal disease.     Electronically Signed   By: Leanna Battles M.D.   On: 03/01/2021 14:31   Assessment:       Acute cholecystitis   Plan:      Discussed the risk of surgery including post-op infxn, seroma, biloma,  chronic pain, poor-delayed wound healing, retained gallstone, conversion to open procedure, post-op SBO or ileus, and need for additional procedures to address said risks.  The risks of general anesthetic including MI, CVA, sudden death or even reaction to anesthetic medications also discussed. Alternatives include continued observation.  Benefits include possible symptom relief, prevention of complications including acute cholecystitis, pancreatitis.   Typical post operative recovery of 3-5 days rest, continued pain in area and incision sites, possible loose stools up to 4-6 weeks, also discussed.   The patient understands the risks, any and all questions were answered to the patient's satisfaction.   Initial recommendation was to admit for IV antibiotics and proceed with robotic lap chole during this admission.  Patient and family however were adamant that patient be discharged and return as an outpatient the following morning.  Due to her history of nausea and vomiting with pain recurrence, I strongly advised that they remain in-house to have access to the IV medications if needed.  Both verbalized understanding but still wishes to be sent home on oral antibiotics and oral pain medicine until the surgery can be done.  Surgery will therefore be scheduled as an outpatient basis.  Strict return precautions were again explained to both of them and they verbalized understanding.  Schedule for robotic assisted laparoscopic cholecystectomy in the meantime.  We will recheck labs tomorrow morning to ensure no other abnormalities prior to procedure.  Case also discussed with the ED provider verbalized understanding as well

## 2021-03-01 NOTE — Consult Note (Signed)
PHARMACY -  BRIEF ANTIBIOTIC NOTE   Pharmacy has received consult(s) for Zosyn from an ED provider.  The patient's profile has been reviewed for ht/wt/allergies/indication/available labs.    One time order(s) placed for  Zosyn 3.375 gram  Further antibiotics/pharmacy consults should be ordered by admitting physician if indicated.                       Thank you, Sharen Hones, PharmD, BCPS Clinical Pharmacist   03/01/2021  3:05 PM

## 2021-03-01 NOTE — ED Notes (Signed)
Pt placed on 2L via [place] due to oxygen saturations on room air reading 86%. Improvement of sats to 99% with application of [place].

## 2021-03-01 NOTE — Consult Note (Signed)
Subjective:    CC: Acute cholecystitis   HPI:  Hayley Maynard is a 87 y.o. female who is consulted by Smith for evaluation of above cc.  Symptoms were first noted a few weeks ago. Pain is sharp, episodic, right upper quadrant, waxing and waning.  Associated with nausea vomiting, exacerbated by nothing specific.   Currently pain is less severe due to IV medication use.  She otherwise denies any other GI issues as noted in the review of systems below.     Past Medical History:  has a past medical history of Arthritis, CKD (chronic kidney disease), stage III (HCC), Coronary artery disease, Glaucoma, Hyperlipidemia, Hypertension, LVH (left ventricular hypertrophy), and Wears dentures.   Past Surgical History:  has a past surgical history that includes Coronary artery bypass graft; Cataract extraction w/ intraocular lens  implant, bilateral; Abdominal hysterectomy; Photocoagulation with laser (Right, 09/26/2018); and Intramedullary (im) nail intertrochanteric (Right, 03/20/2020).   Family History: family history is not on file.   Social History:  reports that she quit smoking about a year ago. Her smoking use included cigarettes. She has never used smokeless tobacco. She reports previous alcohol use. She reports previous drug use.   Current Medications:         Prior to Admission medications  Medication Sig Start Date End Date Taking? Authorizing Provider  amoxicillin-clavulanate (AUGMENTIN) 875-125 MG tablet Take 1 tablet by mouth 2 (two) times daily for 14 days. 03/01/21 03/15/21 Yes Smith, Zachary P, MD  HYDROcodone-acetaminophen (NORCO) 5-325 MG tablet Take 1 tablet by mouth every 6 (six) hours as needed for up to 3 days for severe pain. 03/01/21 03/04/21 Yes Smith, Zachary P, MD  ondansetron (ZOFRAN) 4 MG tablet Take 1 tablet (4 mg total) by mouth every 8 (eight) hours as needed for up to 10 doses for nausea or vomiting. 03/01/21   Yes Smith, Zachary P, MD  amLODipine (NORVASC) 10 MG tablet Take by  mouth. 04/28/20     [provider]  bisacodyl (DULCOLAX) 10 MG suppository Place 1 suppository (10 mg total) rectally daily as needed for moderate constipation. 03/24/20     Mathews, Elizabeth G, MD  brimonidine (ALPHAGAN) 0.2 % ophthalmic solution Place 1 drop into both eyes in the morning and at bedtime.       [provider]  dorzolamide-timolol (COSOPT) 22.3-6.8 MG/ML ophthalmic solution Place 1 drop into both eyes 2 (two) times daily. 04/29/19     [provider]  febuxostat (ULORIC) 40 MG tablet Take 40 mg by mouth daily.       [provider]  ferrous fumarate-b12-vitamic C-folic acid (TRINSICON / FOLTRIN) capsule Take 1 capsule by mouth 2 (two) times daily after a meal. 03/24/20     Mathews, Elizabeth G, MD  furosemide (LASIX) 20 MG tablet TAKE 1 TABLET(20 MG) BY MOUTH EVERY DAY FOR 1 TO 3 DAYS AS NEEDED FOR SWELLING 11/16/20     [provider]  hydrALAZINE (APRESOLINE) 50 MG tablet Take 1 tablet by mouth 2 (two) times daily. 09/02/19     [provider]  latanoprost (XALATAN) 0.005 % ophthalmic solution   04/28/20     [provider]  losartan (COZAAR) 50 MG tablet Take by mouth. 04/28/20     [provider]  magnesium citrate SOLN Take 296 mLs (1 Bottle total) by mouth once as needed for severe constipation. 03/24/20     Mathews, Elizabeth G, MD  Multiple Vitamin (MULTIVITAMIN WITH MINERALS) TABS tablet Take 1 tablet   by mouth daily. 03/25/20     Mathews, Elizabeth G, MD  senna-docusate (SENOKOT-S) 8.6-50 MG tablet Take 1 tablet by mouth at bedtime as needed for mild constipation. 03/24/20     Mathews, Elizabeth G, MD  simvastatin (ZOCOR) 20 MG tablet Take 20 mg by mouth daily.       [provider]      Allergies:       Allergies as of 03/01/2021 - Review Complete 03/01/2021  Allergen Reaction Noted   Propoxyphene Other (See Comments) 11/08/2018   Telmisartan-hctz Other (See Comments) 02/10/2014      ROS:  General:  Denies weight loss, weight gain, fatigue, fevers, chills, and night sweats. Eyes: Denies blurry vision, double vision, eye pain, itchy eyes, and tearing. Ears: Denies hearing loss, earache, and ringing in ears. Nose: Denies sinus pain, congestion, infections, runny nose, and nosebleeds. Mouth/throat: Denies hoarseness, sore throat, bleeding gums, and difficulty swallowing. Heart: Denies chest pain, palpitations, racing heart, irregular heartbeat, leg pain or swelling, and decreased activity tolerance. Respiratory: Denies breathing difficulty, shortness of breath, wheezing, cough, and sputum. GI: Denies change in appetite, heartburn, constipation, diarrhea, and blood in stool. GU: Denies difficulty urinating, pain with urinating, urgency, frequency, blood in urine. Musculoskeletal: Denies joint stiffness, pain, swelling, muscle weakness. Skin: Denies rash, itching, mass, tumors, sores, and boils Neurologic: Denies headache, fainting, dizziness, seizures, numbness, and tingling. Psychiatric: Denies depression, anxiety, difficulty sleeping, and memory loss. Endocrine: Denies heat or cold intolerance, and increased thirst or urination. Blood/lymph: Denies easy bruising, and swollen glands       Objective:    BP (!) 170/73   Pulse 74   Temp 98.8 F (37.1 C) (Oral)   Resp 18   Ht 5' (1.524 m)   Wt 56.2 kg   SpO2 99%   BMI 24.22 kg/m      Constitutional :  alert, cooperative, appears stated age, and no distress  Lymphatics/Throat:  no asymmetry, masses, or scars  Respiratory:  clear to auscultation bilaterally  Cardiovascular:  regular rate and rhythm  Gastrointestinal: Soft, no guarding, focal tenderness to palpation in right upper quadrant , less so in right lower quadrant and epigastric  Musculoskeletal: Steady movement  Skin: Cool and moist, no surgical scars  Psychiatric: Normal affect, non-agitated, not confused         LABS:  CMP Latest Ref Rng & Units 03/01/2021 03/24/2020  03/23/2020  Glucose 70 - 99 mg/dL 112(H) 97 127(H)  BUN 8 - 23 mg/dL 23 28(H) 33(H)  Creatinine 0.44 - 1.00 mg/dL 1.27(H) 1.19(H) 1.47(H)  Sodium 135 - 145 mmol/L 141 143 142  Potassium 3.5 - 5.1 mmol/L 4.4 4.3 4.1  Chloride 98 - 111 mmol/L 101 107 104  CO2 22 - 32 mmol/L 30 29 28  Calcium 8.9 - 10.3 mg/dL 9.2 8.6(L) 8.3(L)  Total Protein 6.5 - 8.1 g/dL 7.7 - -  Total Bilirubin 0.3 - 1.2 mg/dL 1.0 - -  Alkaline Phos 38 - 126 U/L 77 - -  AST 15 - 41 U/L 21 - -  ALT 0 - 44 U/L 7 - -    CBC Latest Ref Rng & Units 03/01/2021 03/24/2020 03/23/2020  WBC 4.0 - 10.5 K/uL 15.0(H) 6.6 6.7  Hemoglobin 12.0 - 15.0 g/dL 12.4 8.0(L) 7.8(L)  Hematocrit 36.0 - 46.0 % 39.2 25.3(L) 26.3(L)  Platelets 150 - 400 K/uL 214 182 170        RADS: CLINICAL DATA:  Right upper quadrant pain for 2 weeks.     EXAM: ULTRASOUND ABDOMEN LIMITED RIGHT UPPER QUADRANT   COMPARISON:  None.   FINDINGS: Gallbladder:   Large shadowing echogenic stone measures 3.6 cm. Associated wall thickening, 4 mm, and possible trace pericholecystic fluid. Negative sonographic Murphy sign.   Common bile duct:   Diameter: 5 mm, within normal limits.   Liver:   No focal lesion identified. Within normal limits in parenchymal echogenicity. Portal vein is patent on color Doppler imaging with normal direction of blood flow towards the liver.   Other: Incidental imaging of the right kidney shows increased parenchymal echogenicity.   IMPRESSION: 1. Cholelithiasis with wall thickening and trace pericholecystic fluid. Findings are indicative of chronic cholecystitis in the presence of a negative sonographic Murphy sign. 2. Increased parenchymal echogenicity the right kidney can be seen with chronic medical renal disease.     Electronically Signed   By: Melinda  Blietz M.D.   On: 03/01/2021 14:31   Assessment:       Acute cholecystitis   Plan:      Discussed the risk of surgery including post-op infxn, seroma, biloma,  chronic pain, poor-delayed wound healing, retained gallstone, conversion to open procedure, post-op SBO or ileus, and need for additional procedures to address said risks.  The risks of general anesthetic including MI, CVA, sudden death or even reaction to anesthetic medications also discussed. Alternatives include continued observation.  Benefits include possible symptom relief, prevention of complications including acute cholecystitis, pancreatitis.   Typical post operative recovery of 3-5 days rest, continued pain in area and incision sites, possible loose stools up to 4-6 weeks, also discussed.   The patient understands the risks, any and all questions were answered to the patient's satisfaction.   Initial recommendation was to admit for IV antibiotics and proceed with robotic lap chole during this admission.  Patient and family however were adamant that patient be discharged and return as an outpatient the following morning.  Due to her history of nausea and vomiting with pain recurrence, I strongly advised that they remain in-house to have access to the IV medications if needed.  Both verbalized understanding but still wishes to be sent home on oral antibiotics and oral pain medicine until the surgery can be done.  Surgery will therefore be scheduled as an outpatient basis.  Strict return precautions were again explained to both of them and they verbalized understanding.  Schedule for robotic assisted laparoscopic cholecystectomy in the meantime.  We will recheck labs tomorrow morning to ensure no other abnormalities prior to procedure.  Case also discussed with the ED provider verbalized understanding as well   

## 2021-03-01 NOTE — H&P (View-Only) (Signed)
Subjective:    CC: Acute cholecystitis   HPI:  Hayley Maynard is a 87 y.o. female who is consulted by Katrinka Blazing for evaluation of above cc.  Symptoms were first noted a few weeks ago. Pain is sharp, episodic, right upper quadrant, waxing and waning.  Associated with nausea vomiting, exacerbated by nothing specific.   Currently pain is less severe due to IV medication use.  She otherwise denies any other GI issues as noted in the review of systems below.     Past Medical History:  has a past medical history of Arthritis, CKD (chronic kidney disease), stage III (HCC), Coronary artery disease, Glaucoma, Hyperlipidemia, Hypertension, LVH (left ventricular hypertrophy), and Wears dentures.   Past Surgical History:  has a past surgical history that includes Coronary artery bypass graft; Cataract extraction w/ intraocular lens  implant, bilateral; Abdominal hysterectomy; Photocoagulation with laser (Right, 09/26/2018); and Intramedullary (im) nail intertrochanteric (Right, 03/20/2020).   Family History: family history is not on file.   Social History:  reports that she quit smoking about a year ago. Her smoking use included cigarettes. She has never used smokeless tobacco. She reports previous alcohol use. She reports previous drug use.   Current Medications:         Prior to Admission medications  Medication Sig Start Date End Date Taking? Authorizing Provider  amoxicillin-clavulanate (AUGMENTIN) 875-125 MG tablet Take 1 tablet by mouth 2 (two) times daily for 14 days. 03/01/21 03/15/21 Yes Gilles Chiquito, MD  HYDROcodone-acetaminophen (NORCO) 5-325 MG tablet Take 1 tablet by mouth every 6 (six) hours as needed for up to 3 days for severe pain. 03/01/21 03/04/21 Yes Gilles Chiquito, MD  ondansetron (ZOFRAN) 4 MG tablet Take 1 tablet (4 mg total) by mouth every 8 (eight) hours as needed for up to 10 doses for nausea or vomiting. 03/01/21   Yes Gilles Chiquito, MD  amLODipine (NORVASC) 10 MG tablet Take by  mouth. 04/28/20     [provider]  bisacodyl (DULCOLAX) 10 MG suppository Place 1 suppository (10 mg total) rectally daily as needed for moderate constipation. 03/24/20     Alwyn Ren, MD  brimonidine (ALPHAGAN) 0.2 % ophthalmic solution Place 1 drop into both eyes in the morning and at bedtime.       [provider]  dorzolamide-timolol (COSOPT) 22.3-6.8 MG/ML ophthalmic solution Place 1 drop into both eyes 2 (two) times daily. 04/29/19     [provider]  febuxostat (ULORIC) 40 MG tablet Take 40 mg by mouth daily.       [provider]  ferrous fumarate-b12-vitamic C-folic acid (TRINSICON / FOLTRIN) capsule Take 1 capsule by mouth 2 (two) times daily after a meal. 03/24/20     Alwyn Ren, MD  furosemide (LASIX) 20 MG tablet TAKE 1 TABLET(20 MG) BY MOUTH EVERY DAY FOR 1 TO 3 DAYS AS NEEDED FOR SWELLING 11/16/20     [provider]  hydrALAZINE (APRESOLINE) 50 MG tablet Take 1 tablet by mouth 2 (two) times daily. 09/02/19     [provider]  latanoprost (XALATAN) 0.005 % ophthalmic solution   04/28/20     [provider]  losartan (COZAAR) 50 MG tablet Take by mouth. 04/28/20     [provider]  magnesium citrate SOLN Take 296 mLs (1 Bottle total) by mouth once as needed for severe constipation. 03/24/20     Alwyn Ren, MD  Multiple Vitamin (MULTIVITAMIN WITH MINERALS) TABS tablet Take 1 tablet  by mouth daily. 03/25/20     Alwyn Ren, MD  senna-docusate (SENOKOT-S) 8.6-50 MG tablet Take 1 tablet by mouth at bedtime as needed for mild constipation. 03/24/20     Alwyn Ren, MD  simvastatin (ZOCOR) 20 MG tablet Take 20 mg by mouth daily.       [provider]      Allergies:       Allergies as of 03/01/2021 - Review Complete 03/01/2021  Allergen Reaction Noted   Propoxyphene Other (See Comments) 11/08/2018   Telmisartan-hctz Other (See Comments) 02/10/2014      ROS:  General:  Denies weight loss, weight gain, fatigue, fevers, chills, and night sweats. Eyes: Denies blurry vision, double vision, eye pain, itchy eyes, and tearing. Ears: Denies hearing loss, earache, and ringing in ears. Nose: Denies sinus pain, congestion, infections, runny nose, and nosebleeds. Mouth/throat: Denies hoarseness, sore throat, bleeding gums, and difficulty swallowing. Heart: Denies chest pain, palpitations, racing heart, irregular heartbeat, leg pain or swelling, and decreased activity tolerance. Respiratory: Denies breathing difficulty, shortness of breath, wheezing, cough, and sputum. GI: Denies change in appetite, heartburn, constipation, diarrhea, and blood in stool. GU: Denies difficulty urinating, pain with urinating, urgency, frequency, blood in urine. Musculoskeletal: Denies joint stiffness, pain, swelling, muscle weakness. Skin: Denies rash, itching, mass, tumors, sores, and boils Neurologic: Denies headache, fainting, dizziness, seizures, numbness, and tingling. Psychiatric: Denies depression, anxiety, difficulty sleeping, and memory loss. Endocrine: Denies heat or cold intolerance, and increased thirst or urination. Blood/lymph: Denies easy bruising, and swollen glands       Objective:    BP (!) 170/73   Pulse 74   Temp 98.8 F (37.1 C) (Oral)   Resp 18   Ht 5' (1.524 m)   Wt 56.2 kg   SpO2 99%   BMI 24.22 kg/m      Constitutional :  alert, cooperative, appears stated age, and no distress  Lymphatics/Throat:  no asymmetry, masses, or scars  Respiratory:  clear to auscultation bilaterally  Cardiovascular:  regular rate and rhythm  Gastrointestinal: Soft, no guarding, focal tenderness to palpation in right upper quadrant , less so in right lower quadrant and epigastric  Musculoskeletal: Steady movement  Skin: Cool and moist, no surgical scars  Psychiatric: Normal affect, non-agitated, not confused         LABS:  CMP Latest Ref Rng & Units 03/01/2021 03/24/2020  03/23/2020  Glucose 70 - 99 mg/dL 063(K) 97 160(F)  BUN 8 - 23 mg/dL 23 09(N) 23(F)  Creatinine 0.44 - 1.00 mg/dL 5.73(U) 2.02(R) 4.27(C)  Sodium 135 - 145 mmol/L 141 143 142  Potassium 3.5 - 5.1 mmol/L 4.4 4.3 4.1  Chloride 98 - 111 mmol/L 101 107 104  CO2 22 - 32 mmol/L 30 29 28   Calcium 8.9 - 10.3 mg/dL 9.2 ) 8.3(L)  Total Protein 6.5 - 8.1 g/dL 7.7 - -  Total Bilirubin 0.3 - 1.2 mg/dL 1.0 - -  Alkaline Phos 38 - 126 U/L 77 - -  AST 15 - 41 U/L 21 - -  ALT 0 - 44 U/L 7 - -    CBC Latest Ref Rng & Units 03/01/2021 03/24/2020 03/23/2020  WBC 4.0 - 10.5 K/uL 15.0(H) 6.6 6.7  Hemoglobin 12.0 - 15.0 g/dL 05/23/2020 8.0(L) 7.8(L)  Hematocrit 36.0 - 46.0 % 39.2 25.3(L) 26.3(L)  Platelets 150 - 400 K/uL 214 182 170        RADS: CLINICAL DATA:  Right upper quadrant pain for 2 weeks.  EXAM: ULTRASOUND ABDOMEN LIMITED RIGHT UPPER QUADRANT   COMPARISON:  None.   FINDINGS: Gallbladder:   Large shadowing echogenic stone measures 3.6 cm. Associated wall thickening, 4 mm, and possible trace pericholecystic fluid. Negative sonographic Murphy sign.   Common bile duct:   Diameter: 5 mm, within normal limits.   Liver:   No focal lesion identified. Within normal limits in parenchymal echogenicity. Portal vein is patent on color Doppler imaging with normal direction of blood flow towards the liver.   Other: Incidental imaging of the right kidney shows increased parenchymal echogenicity.   IMPRESSION: 1. Cholelithiasis with wall thickening and trace pericholecystic fluid. Findings are indicative of chronic cholecystitis in the presence of a negative sonographic Murphy sign. 2. Increased parenchymal echogenicity the right kidney can be seen with chronic medical renal disease.     Electronically Signed   By: Leanna Battles M.D.   On: 03/01/2021 14:31   Assessment:       Acute cholecystitis   Plan:      Discussed the risk of surgery including post-op infxn, seroma, biloma,  chronic pain, poor-delayed wound healing, retained gallstone, conversion to open procedure, post-op SBO or ileus, and need for additional procedures to address said risks.  The risks of general anesthetic including MI, CVA, sudden death or even reaction to anesthetic medications also discussed. Alternatives include continued observation.  Benefits include possible symptom relief, prevention of complications including acute cholecystitis, pancreatitis.   Typical post operative recovery of 3-5 days rest, continued pain in area and incision sites, possible loose stools up to 4-6 weeks, also discussed.   The patient understands the risks, any and all questions were answered to the patient's satisfaction.   Initial recommendation was to admit for IV antibiotics and proceed with robotic lap chole during this admission.  Patient and family however were adamant that patient be discharged and return as an outpatient the following morning.  Due to her history of nausea and vomiting with pain recurrence, I strongly advised that they remain in-house to have access to the IV medications if needed.  Both verbalized understanding but still wishes to be sent home on oral antibiotics and oral pain medicine until the surgery can be done.  Surgery will therefore be scheduled as an outpatient basis.  Strict return precautions were again explained to both of them and they verbalized understanding.  Schedule for robotic assisted laparoscopic cholecystectomy in the meantime.  We will recheck labs tomorrow morning to ensure no other abnormalities prior to procedure.  Case also discussed with the ED provider verbalized understanding as well

## 2021-03-02 ENCOUNTER — Encounter: Payer: Self-pay | Admitting: Surgery

## 2021-03-02 ENCOUNTER — Encounter
Admission: AD | Disposition: A | Discharge status: Home / Self Care | Payer: Self-pay | Source: Home / Self Care | Attending: Surgery

## 2021-03-02 ENCOUNTER — Ambulatory Visit: Payer: Medicare Other | Admitting: Certified Registered Nurse Anesthetist

## 2021-03-02 ENCOUNTER — Inpatient Hospital Stay
Admission: AD | Admit: 2021-03-02 | Discharge: 2021-03-04 | DRG: 419 | Disposition: A | Discharge status: Home / Self Care | Payer: Medicare Other | Attending: Surgery | Admitting: Surgery

## 2021-03-02 DIAGNOSIS — I2510 Atherosclerotic heart disease of native coronary artery without angina pectoris: Secondary | ICD-10-CM | POA: Diagnosis present

## 2021-03-02 DIAGNOSIS — K660 Peritoneal adhesions (postprocedural) (postinfection): Secondary | ICD-10-CM | POA: Diagnosis present

## 2021-03-02 DIAGNOSIS — Z79899 Other long term (current) drug therapy: Secondary | ICD-10-CM | POA: Diagnosis not present

## 2021-03-02 DIAGNOSIS — Z87891 Personal history of nicotine dependence: Secondary | ICD-10-CM | POA: Diagnosis not present

## 2021-03-02 DIAGNOSIS — K828 Other specified diseases of gallbladder: Secondary | ICD-10-CM | POA: Diagnosis present

## 2021-03-02 DIAGNOSIS — I129 Hypertensive chronic kidney disease with stage 1 through stage 4 chronic kidney disease, or unspecified chronic kidney disease: Secondary | ICD-10-CM | POA: Diagnosis present

## 2021-03-02 DIAGNOSIS — Z20822 Contact with and (suspected) exposure to covid-19: Secondary | ICD-10-CM | POA: Diagnosis present

## 2021-03-02 DIAGNOSIS — N183 Chronic kidney disease, stage 3 unspecified: Secondary | ICD-10-CM | POA: Diagnosis present

## 2021-03-02 DIAGNOSIS — Z951 Presence of aortocoronary bypass graft: Secondary | ICD-10-CM | POA: Diagnosis not present

## 2021-03-02 DIAGNOSIS — K8000 Calculus of gallbladder with acute cholecystitis without obstruction: Secondary | ICD-10-CM | POA: Diagnosis present

## 2021-03-02 DIAGNOSIS — K810 Acute cholecystitis: Secondary | ICD-10-CM | POA: Diagnosis present

## 2021-03-02 DIAGNOSIS — E785 Hyperlipidemia, unspecified: Secondary | ICD-10-CM | POA: Diagnosis present

## 2021-03-02 LAB — CBC
HCT: 34.5 % — ABNORMAL LOW (ref 36.0–46.0)
Hemoglobin: 10.6 g/dL — ABNORMAL LOW (ref 12.0–15.0)
MCH: 29.4 pg (ref 26.0–34.0)
MCHC: 30.7 g/dL (ref 30.0–36.0)
MCV: 95.6 fL (ref 80.0–100.0)
Platelets: 185 10*3/uL (ref 150–400)
RBC: 3.61 MIL/uL — ABNORMAL LOW (ref 3.87–5.11)
RDW: 13.9 % (ref 11.5–15.5)
WBC: 12.1 10*3/uL — ABNORMAL HIGH (ref 4.0–10.5)
nRBC: 0 % (ref 0.0–0.2)

## 2021-03-02 LAB — HEPATIC FUNCTION PANEL
ALT: 6 U/L (ref 0–44)
AST: 17 U/L (ref 15–41)
Albumin: 3.3 g/dL — ABNORMAL LOW (ref 3.5–5.0)
Alkaline Phosphatase: 61 U/L (ref 38–126)
Bilirubin, Direct: 0.1 mg/dL (ref 0.0–0.2)
Indirect Bilirubin: 0.8 mg/dL (ref 0.3–0.9)
Total Bilirubin: 0.9 mg/dL (ref 0.3–1.2)
Total Protein: 6.7 g/dL (ref 6.5–8.1)

## 2021-03-02 LAB — CREATININE, SERUM
Creatinine, Ser: 2.11 mg/dL — ABNORMAL HIGH (ref 0.44–1.00)
GFR, Estimated: 21 mL/min — ABNORMAL LOW (ref >60)

## 2021-03-02 SURGERY — CHOLECYSTECTOMY, ROBOT-ASSISTED, LAPAROSCOPIC
Anesthesia: General

## 2021-03-02 MED ORDER — ONDANSETRON HCL 4 MG/2ML IJ SOLN
INTRAMUSCULAR | Status: AC
  Filled 2021-03-02: qty 2

## 2021-03-02 MED ORDER — BUPIVACAINE-EPINEPHRINE (PF) 0.5% -1:200000 IJ SOLN
INTRAMUSCULAR | Status: DC | PRN
  Administered 2021-03-02: 22 mL via PERINEURAL

## 2021-03-02 MED ORDER — ACETAMINOPHEN 10 MG/ML IV SOLN
1000.0000 mg | Freq: Four times a day (QID) | INTRAVENOUS | Status: AC
  Administered 2021-03-02 – 2021-03-03 (×4): 1000 mg via INTRAVENOUS
  Filled 2021-03-02 (×4): qty 100

## 2021-03-02 MED ORDER — PHENYLEPHRINE HCL (PRESSORS) 10 MG/ML IV SOLN
INTRAVENOUS | Status: DC | PRN
  Administered 2021-03-02 (×4): 50 ug via INTRAVENOUS
  Administered 2021-03-02: 100 ug via INTRAVENOUS

## 2021-03-02 MED ORDER — ONDANSETRON HCL 4 MG/2ML IJ SOLN
INTRAMUSCULAR | Status: DC | PRN
  Administered 2021-03-02: 4 mg via INTRAVENOUS

## 2021-03-02 MED ORDER — LIDOCAINE HCL (CARDIAC) PF 100 MG/5ML IV SOSY
PREFILLED_SYRINGE | INTRAVENOUS | Status: DC | PRN
  Administered 2021-03-02: 60 mg via INTRAVENOUS

## 2021-03-02 MED ORDER — BUPIVACAINE HCL (PF) 0.5 % IJ SOLN
INTRAMUSCULAR | Status: AC
  Filled 2021-03-02: qty 30

## 2021-03-02 MED ORDER — DEXAMETHASONE SODIUM PHOSPHATE 10 MG/ML IJ SOLN
INTRAMUSCULAR | Status: DC | PRN
  Administered 2021-03-02: 10 mg via INTRAVENOUS

## 2021-03-02 MED ORDER — HYDROMORPHONE HCL 1 MG/ML IJ SOLN: 0.5000 mg | INTRAMUSCULAR | Status: DC | PRN

## 2021-03-02 MED ORDER — HYDRALAZINE HCL 20 MG/ML IJ SOLN: 2.0000 mg | Freq: Four times a day (QID) | INTRAMUSCULAR | Status: DC | PRN

## 2021-03-02 MED ORDER — EPHEDRINE SULFATE 50 MG/ML IJ SOLN
INTRAMUSCULAR | Status: DC | PRN
  Administered 2021-03-02: 5 mg via INTRAVENOUS

## 2021-03-02 MED ORDER — ONDANSETRON HCL 4 MG/2ML IJ SOLN: 4.0000 mg | Freq: Once | INTRAMUSCULAR | Status: DC | PRN

## 2021-03-02 MED ORDER — ROCURONIUM BROMIDE 10 MG/ML (PF) SYRINGE
PREFILLED_SYRINGE | INTRAVENOUS | Status: AC
  Filled 2021-03-02: qty 10

## 2021-03-02 MED ORDER — CHLORHEXIDINE GLUCONATE 0.12 % MT SOLN
OROMUCOSAL | Status: AC
  Administered 2021-03-02: 15 mL via OROMUCOSAL
  Filled 2021-03-02: qty 15

## 2021-03-02 MED ORDER — PROPOFOL 10 MG/ML IV BOLUS
INTRAVENOUS | Status: DC | PRN
  Administered 2021-03-02: 60 mg via INTRAVENOUS

## 2021-03-02 MED ORDER — LIDOCAINE HCL (PF) 2 % IJ SOLN
INTRAMUSCULAR | Status: AC
  Filled 2021-03-02: qty 5

## 2021-03-02 MED ORDER — BRIMONIDINE TARTRATE 0.2 % OP SOLN
1.0000 [drp] | Freq: Two times a day (BID) | OPHTHALMIC | Status: DC
  Administered 2021-03-03 – 2021-03-04 (×3): 1 [drp] via OPHTHALMIC
  Filled 2021-03-02: qty 5

## 2021-03-02 MED ORDER — DEXAMETHASONE SODIUM PHOSPHATE 10 MG/ML IJ SOLN
INTRAMUSCULAR | Status: AC
  Filled 2021-03-02: qty 1

## 2021-03-02 MED ORDER — INDOCYANINE GREEN 25 MG IV SOLR
1.2500 mg | Freq: Once | INTRAVENOUS | Status: AC
  Administered 2021-03-02: 1.25 mg via INTRAVENOUS
  Filled 2021-03-02: qty 0.5

## 2021-03-02 MED ORDER — SODIUM CHLORIDE 0.9 % IV SOLN: INTRAVENOUS | Status: DC

## 2021-03-02 MED ORDER — ENOXAPARIN SODIUM 30 MG/0.3ML IJ SOSY
30.0000 mg | PREFILLED_SYRINGE | INTRAMUSCULAR | Status: DC
  Administered 2021-03-03 – 2021-03-04 (×2): 30 mg via SUBCUTANEOUS
  Filled 2021-03-02 (×2): qty 0.3

## 2021-03-02 MED ORDER — CHLORHEXIDINE GLUCONATE 0.12 % MT SOLN: 15.0000 mL | Freq: Once | OROMUCOSAL | Status: AC

## 2021-03-02 MED ORDER — FENTANYL CITRATE (PF) 100 MCG/2ML IJ SOLN: 25.0000 ug | INTRAMUSCULAR | Status: DC | PRN

## 2021-03-02 MED ORDER — FENTANYL CITRATE (PF) 100 MCG/2ML IJ SOLN
INTRAMUSCULAR | Status: DC | PRN
  Administered 2021-03-02: 50 ug via INTRAVENOUS
  Administered 2021-03-02: 25 ug via INTRAVENOUS
  Administered 2021-03-02 (×2): 50 ug via INTRAVENOUS
  Administered 2021-03-02: 25 ug via INTRAVENOUS

## 2021-03-02 MED ORDER — SODIUM CHLORIDE 0.9 % IV SOLN
2.0000 g | INTRAVENOUS | Status: DC
  Administered 2021-03-02 – 2021-03-04 (×2): 2 g via INTRAVENOUS
  Filled 2021-03-02: qty 20
  Filled 2021-03-02: qty 2
  Filled 2021-03-02: qty 20

## 2021-03-02 MED ORDER — ROCURONIUM BROMIDE 100 MG/10ML IV SOLN
INTRAVENOUS | Status: DC | PRN
  Administered 2021-03-02 (×2): 20 mg via INTRAVENOUS
  Administered 2021-03-02: 30 mg via INTRAVENOUS
  Administered 2021-03-02: 20 mg via INTRAVENOUS

## 2021-03-02 MED ORDER — PROPOFOL 10 MG/ML IV BOLUS
INTRAVENOUS | Status: AC
  Filled 2021-03-02: qty 20

## 2021-03-02 MED ORDER — FENTANYL CITRATE (PF) 100 MCG/2ML IJ SOLN
INTRAMUSCULAR | Status: AC
  Filled 2021-03-02: qty 2

## 2021-03-02 MED ORDER — SUGAMMADEX SODIUM 200 MG/2ML IV SOLN
INTRAVENOUS | Status: DC | PRN
  Administered 2021-03-02: 200 mg via INTRAVENOUS

## 2021-03-02 MED ORDER — LACTATED RINGERS IV SOLN: INTRAVENOUS | Status: DC

## 2021-03-02 MED ORDER — BUPIVACAINE-EPINEPHRINE (PF) 0.5% -1:200000 IJ SOLN
INTRAMUSCULAR | Status: AC
  Filled 2021-03-02: qty 30

## 2021-03-02 MED ORDER — CHLORHEXIDINE GLUCONATE CLOTH 2 % EX PADS
6.0000 | MEDICATED_PAD | Freq: Once | CUTANEOUS | Status: AC
  Administered 2021-03-02: 6 via TOPICAL

## 2021-03-02 MED ORDER — EPHEDRINE 5 MG/ML INJ
INTRAVENOUS | Status: AC
  Filled 2021-03-02: qty 10

## 2021-03-02 MED ORDER — ENOXAPARIN SODIUM 40 MG/0.4ML IJ SOSY: 40.0000 mg | PREFILLED_SYRINGE | INTRAMUSCULAR | Status: DC

## 2021-03-02 MED ORDER — DORZOLAMIDE HCL-TIMOLOL MAL 2-0.5 % OP SOLN
1.0000 [drp] | Freq: Two times a day (BID) | OPHTHALMIC | Status: DC
  Administered 2021-03-03 – 2021-03-04 (×3): 1 [drp] via OPHTHALMIC
  Filled 2021-03-02: qty 10

## 2021-03-02 MED ORDER — ORAL CARE MOUTH RINSE: 15.0000 mL | Freq: Once | OROMUCOSAL | Status: AC

## 2021-03-02 SURGICAL SUPPLY — 65 items
ADH SKN CLS APL DERMABOND .7 (GAUZE/BANDAGES/DRESSINGS) ×1
ANCHOR TIS RET SYS 235ML (MISCELLANEOUS) IMPLANT
APL PRP STRL LF DISP 70% ISPRP (MISCELLANEOUS) ×1
BAG INFUSER PRESSURE 100CC (MISCELLANEOUS) IMPLANT
BAG TISS RTRVL C235 10X14 (MISCELLANEOUS)
BLADE SURG SZ11 CARB STEEL (BLADE) ×2 IMPLANT
BULB RESERV EVAC DRAIN JP 100C (MISCELLANEOUS) ×2 IMPLANT
CANISTER SUCT 1200ML W/VALVE (MISCELLANEOUS) IMPLANT
CANNULA REDUC XI 12-8 STAPL (CANNULA) ×1
CANNULA REDUCER 12-8 DVNC XI (CANNULA) ×1 IMPLANT
CATH REDDICK CHOLANGI 4FR 50CM (CATHETERS) IMPLANT
CHLORAPREP W/TINT 26 (MISCELLANEOUS) ×2 IMPLANT
CLIP VESOLOCK LG 6/CT PURPLE (CLIP) ×2 IMPLANT
CLIP VESOLOCK MED LG 6/CT (CLIP) ×2 IMPLANT
COVER TIP SHEARS 8 DVNC (MISCELLANEOUS) IMPLANT
COVER TIP SHEARS 8MM DA VINCI (MISCELLANEOUS)
DECANTER SPIKE VIAL GLASS SM (MISCELLANEOUS) ×4 IMPLANT
DEFOGGER SCOPE WARMER CLEARIFY (MISCELLANEOUS) ×2 IMPLANT
DERMABOND ADVANCED (GAUZE/BANDAGES/DRESSINGS) ×1
DERMABOND ADVANCED .7 DNX12 (GAUZE/BANDAGES/DRESSINGS) ×1 IMPLANT
DRAIN CHANNEL JP 15F RND 16 (MISCELLANEOUS) ×2 IMPLANT
DRAPE ARM DVNC X/XI (DISPOSABLE) ×4 IMPLANT
DRAPE C-ARM XRAY 36X54 (DRAPES) IMPLANT
DRAPE COLUMN DVNC XI (DISPOSABLE) ×1 IMPLANT
DRAPE DA VINCI XI ARM (DISPOSABLE) ×4
DRAPE DA VINCI XI COLUMN (DISPOSABLE) ×1
ELECT CAUTERY BLADE 6.4 (BLADE) ×2 IMPLANT
ELECT REM PT RETURN 9FT ADLT (ELECTROSURGICAL) ×2
ELECTRODE REM PT RTRN 9FT ADLT (ELECTROSURGICAL) ×1 IMPLANT
GAUZE 4X4 16PLY ~~LOC~~+RFID DBL (SPONGE) ×2 IMPLANT
GLOVE SURG SYN 6.5 ES PF (GLOVE) ×6 IMPLANT
GLOVE SURG UNDER POLY LF SZ7 (GLOVE) ×6 IMPLANT
GOWN STRL REUS W/ TWL LRG LVL3 (GOWN DISPOSABLE) ×3 IMPLANT
GOWN STRL REUS W/TWL LRG LVL3 (GOWN DISPOSABLE) ×6
GRASPER SUT TROCAR 14GX15 (MISCELLANEOUS) IMPLANT
IRRIGATOR SUCT 8 DISP DVNC XI (IRRIGATION / IRRIGATOR) IMPLANT
IRRIGATOR SUCTION 8MM XI DISP (IRRIGATION / IRRIGATOR)
IV NS 1000ML (IV SOLUTION)
IV NS 1000ML BAXH (IV SOLUTION) IMPLANT
LABEL OR SOLS (LABEL) ×2 IMPLANT
MANIFOLD NEPTUNE II (INSTRUMENTS) ×2 IMPLANT
NEEDLE HYPO 22GX1.5 SAFETY (NEEDLE) ×2 IMPLANT
NEEDLE INSUFFLATION 14GA 120MM (NEEDLE) ×2 IMPLANT
NS IRRIG 500ML POUR BTL (IV SOLUTION) ×2 IMPLANT
OBTURATOR OPTICAL STANDARD 8MM (TROCAR) ×1
OBTURATOR OPTICAL STND 8 DVNC (TROCAR) ×1
OBTURATOR OPTICALSTD 8 DVNC (TROCAR) ×1 IMPLANT
PACK LAP CHOLECYSTECTOMY (MISCELLANEOUS) ×2 IMPLANT
PENCIL ELECTRO HAND CTR (MISCELLANEOUS) ×2 IMPLANT
SCISSORS METZENBAUM CVD 33 (INSTRUMENTS) ×2 IMPLANT
SEAL CANN UNIV 5-8 DVNC XI (MISCELLANEOUS) ×3 IMPLANT
SEAL XI 5MM-8MM UNIVERSAL (MISCELLANEOUS) ×3
SET TUBE SMOKE EVAC HIGH FLOW (TUBING) ×2 IMPLANT
SOLUTION ELECTROLUBE (MISCELLANEOUS) ×2 IMPLANT
SPONGE DRAIN TRACH 4X4 STRL 2S (GAUZE/BANDAGES/DRESSINGS) ×2 IMPLANT
STAPLER CANNULA SEAL DVNC XI (STAPLE) ×1 IMPLANT
STAPLER CANNULA SEAL XI (STAPLE) ×1
SUT ETHILON 3-0 FS-10 30 BLK (SUTURE) ×4
SUT MNCRL 4-0 (SUTURE) ×4
SUT MNCRL 4-0 27XMFL (SUTURE) ×2
SUT SILK 3 0 SH 30 (SUTURE) ×4 IMPLANT
SUT VICRYL 0 AB UR-6 (SUTURE) ×2 IMPLANT
SUTURE EHLN 3-0 FS-10 30 BLK (SUTURE) ×2 IMPLANT
SUTURE MNCRL 4-0 27XMF (SUTURE) ×2 IMPLANT
SYR 30ML LL (SYRINGE) IMPLANT

## 2021-03-02 NOTE — Interval H&P Note (Signed)
History and Physical Interval Note:  03/02/2021 12:13 PM  Hayley Maynard  has presented today for surgery, with the diagnosis of Gallbladder Extraction.  The various methods of treatment have been discussed with the patient and family. After consideration of risks, benefits and other options for treatment, the patient has consented to  Procedure(s): XI ROBOTIC ASSISTED LAPAROSCOPIC CHOLECYSTECTOMY (N/A) as a surgical intervention.  The patient's history has been reviewed, patient examined, no change in status, stable for surgery.  I have reviewed the patient's chart and labs.  Questions were answered to the patient's satisfaction.     Jalyn Dutta Tonna Boehringer

## 2021-03-02 NOTE — Anesthesia Procedure Notes (Signed)
Procedure Name: Intubation Date/Time: 03/02/2021 1:14 PM Performed by: Joanette Gula, Aviyah Swetz, CRNA Pre-anesthesia Checklist: Patient identified, Emergency Drugs available, Suction available and Patient being monitored Patient Re-evaluated:Patient Re-evaluated prior to induction Oxygen Delivery Method: Circle system utilized Preoxygenation: Pre-oxygenation with 100% oxygen Induction Type: IV induction Ventilation: Mask ventilation without difficulty Laryngoscope Size: McGraph and 3 Grade View: Grade I Tube type: Oral Tube size: 7.0 mm Number of attempts: 1 Airway Equipment and Method: Stylet and Oral airway Placement Confirmation: ETT inserted through vocal cords under direct vision, positive ETCO2 and breath sounds checked- equal and bilateral Secured at: 20 cm Tube secured with: Tape Dental Injury: Teeth and Oropharynx as per pre-operative assessment

## 2021-03-02 NOTE — Transfer of Care (Signed)
Immediate Anesthesia Transfer of Care Note  Patient: Hayley Maynard  Procedure(s) Performed: XI ROBOTIC ASSISTED LAPAROSCOPIC CHOLECYSTECTOMY  Patient Location: PACU  Anesthesia Type:General  Level of Consciousness: awake  Airway & Oxygen Therapy: Patient Spontanous Breathing and Patient connected to face mask oxygen  Post-op Assessment: Report given to RN and Post -op Vital signs reviewed and stable  Post vital signs: Reviewed and stable  Last Vitals:  Vitals Value Taken Time  BP    Temp    Pulse    Resp    SpO2      Last Pain:  Vitals:   03/02/21 1115  TempSrc: Temporal  PainSc: 0-No pain         Complications: No notable events documented.

## 2021-03-02 NOTE — Progress Notes (Signed)
PHARMACIST - PHYSICIAN COMMUNICATION  CONCERNING:  Enoxaparin (Lovenox) for DVT Prophylaxis    RECOMMENDATION: Patient was prescribed enoxaprin 40mg  q24 hours for VTE prophylaxis.   There were no vitals filed for this visit.  There is no height or weight on file to calculate BMI.  Estimated Creatinine Clearance: 20.4 mL/min (A) (by C-G formula based on SCr of 1.27 mg/dL (H)).   Patient is candidate for enoxaparin 30mg  every 24 hours based on CrCl <73ml/min or Weight <45kg  DESCRIPTION: Pharmacy has adjusted enoxaparin dose per [facility] policy.  Patient is now receiving enoxaparin 30 mg every 24 hours    31m, PharmD Clinical Pharmacist  03/02/2021 5:59 PM

## 2021-03-02 NOTE — Anesthesia Preprocedure Evaluation (Signed)
Anesthesia Evaluation  Patient identified by MRN, date of birth, ID band Patient awake    Reviewed: Allergy & Precautions, NPO status , Patient's Chart, lab work & pertinent test results  History of Anesthesia Complications Negative for: history of anesthetic complications  Airway Mallampati: II  TM Distance: >3 FB Neck ROM: Full    Dental  (+) Edentulous Upper, Edentulous Lower   Pulmonary neg sleep apnea, neg COPD, Patient abstained from smoking., former smoker,    breath sounds clear to auscultation- rhonchi (-) wheezing      Cardiovascular hypertension, Pt. on medications + CAD and + CABG  (-) Cardiac Stents  Rhythm:Regular Rate:Normal - Systolic murmurs and - Diastolic murmurs    Neuro/Psych neg Seizures negative neurological ROS  negative psych ROS   GI/Hepatic negative GI ROS, Neg liver ROS,   Endo/Other  negative endocrine ROSneg diabetes  Renal/GU CRFRenal disease     Musculoskeletal  (+) Arthritis ,   Abdominal (+) - obese,   Peds  Hematology  (+) anemia ,   Anesthesia Other Findings Past Medical History: No date: Arthritis     Comment:  feet No date: CKD (chronic kidney disease), stage III (HCC) No date: Coronary artery disease No date: Glaucoma No date: Hyperlipidemia No date: Hypertension No date: LVH (left ventricular hypertrophy) No date: Wears dentures     Comment:  full upper and lower   Reproductive/Obstetrics                             Anesthesia Physical Anesthesia Plan  ASA: 3  Anesthesia Plan: General   Post-op Pain Management:    Induction: Intravenous  PONV Risk Score and Plan: 2 and Ondansetron, Treatment may vary due to age or medical condition and Dexamethasone  Airway Management Planned: Oral ETT  Additional Equipment:   Intra-op Plan:   Post-operative Plan: Extubation in OR  Informed Consent: I have reviewed the patients History and  Physical, chart, labs and discussed the procedure including the risks, benefits and alternatives for the proposed anesthesia with the patient or authorized representative who has indicated his/her understanding and acceptance.     Dental advisory given  Plan Discussed with: CRNA and Anesthesiologist  Anesthesia Plan Comments:         Anesthesia Quick Evaluation

## 2021-03-03 LAB — BASIC METABOLIC PANEL
Anion gap: 10 (ref 5–15)
BUN: 44 mg/dL — ABNORMAL HIGH (ref 8–23)
CO2: 27 mmol/L (ref 22–32)
Calcium: 8 mg/dL — ABNORMAL LOW (ref 8.9–10.3)
Chloride: 103 mmol/L (ref 98–111)
Creatinine, Ser: 2.1 mg/dL — ABNORMAL HIGH (ref 0.44–1.00)
GFR, Estimated: 21 mL/min — ABNORMAL LOW (ref >60)
Glucose, Bld: 117 mg/dL — ABNORMAL HIGH (ref 70–99)
Potassium: 5 mmol/L (ref 3.5–5.1)
Sodium: 140 mmol/L (ref 135–145)

## 2021-03-03 LAB — CBC
HCT: 34.2 % — ABNORMAL LOW (ref 36.0–46.0)
Hemoglobin: 10.5 g/dL — ABNORMAL LOW (ref 12.0–15.0)
MCH: 28.8 pg (ref 26.0–34.0)
MCHC: 30.7 g/dL (ref 30.0–36.0)
MCV: 93.7 fL (ref 80.0–100.0)
Platelets: 186 10*3/uL (ref 150–400)
RBC: 3.65 MIL/uL — ABNORMAL LOW (ref 3.87–5.11)
RDW: 14 % (ref 11.5–15.5)
WBC: 10.6 10*3/uL — ABNORMAL HIGH (ref 4.0–10.5)
nRBC: 0 % (ref 0.0–0.2)

## 2021-03-03 LAB — HEPATIC FUNCTION PANEL
ALT: 7 U/L (ref 0–44)
AST: 17 U/L (ref 15–41)
Albumin: 2.9 g/dL — ABNORMAL LOW (ref 3.5–5.0)
Alkaline Phosphatase: 63 U/L (ref 38–126)
Bilirubin, Direct: <0.1 mg/dL (ref 0.0–0.2)
Total Bilirubin: 0.7 mg/dL (ref 0.3–1.2)
Total Protein: 6.4 g/dL — ABNORMAL LOW (ref 6.5–8.1)

## 2021-03-03 MED ORDER — ENSURE ENLIVE PO LIQD
237.0000 mL | Freq: Two times a day (BID) | ORAL | Status: DC
  Administered 2021-03-03 – 2021-03-04 (×3): 237 mL via ORAL

## 2021-03-03 MED ORDER — LATANOPROST 0.005 % OP SOLN
1.0000 [drp] | Freq: Every day | OPHTHALMIC | Status: DC
  Administered 2021-03-04: 1 [drp] via OPHTHALMIC
  Filled 2021-03-03: qty 2.5

## 2021-03-03 NOTE — Progress Notes (Signed)
Mobility Specialist - Progress Note   03/03/21 1400  Mobility  Activity Transferred to/from [facility]  Level of Assistance Minimal assist, patient does 75% or more  Assistive Device Other (Comment) (HHA)  Distance Ambulated (ft) 2 ft  Mobility Out of bed for toileting  Mobility Response Tolerated well  Mobility performed by Mobility specialist  $Mobility charge 1 Mobility    Pt in wheelchair upon arrival, family at bedside. Pt c/o discomfort at JP site. Requests to use bathroom. BSC provided as pt able to stand and pivot transfer with HHA. Pt does attempt standing from w/c before cued that it's safe to do so, but does carry over commands very well. MinA for peri-care. Pt returned to w/c at the end of session, family still at side.    Filiberto Pinks Mobility Specialist 03/03/21, 2:51 PM

## 2021-03-03 NOTE — Progress Notes (Addendum)
Subjective:  CC: Hayley Maynard is a 87 y.o. female  Hospital stay day 1, 1 Day Post-Op robo lap chole  HPI: No issues overnight.  Minimal pain.  ROS:  General: Denies weight loss, weight gain, fatigue, fevers, chills, and night sweats. Heart: Denies chest pain, palpitations, racing heart, irregular heartbeat, leg pain or swelling, and decreased activity tolerance. Respiratory: Denies breathing difficulty, shortness of breath, wheezing, cough, and sputum. GI: Denies change in appetite, heartburn, nausea, vomiting, constipation, diarrhea, and blood in stool. GU: Denies difficulty urinating, pain with urinating, urgency, frequency, blood in urine.   Objective:   Temp:  [97.5 F (36.4 C)-99.1 F (37.3 C)] 97.8 F (36.6 C) (07/13 0331) Pulse Rate:  [65-77] 68 (07/13 0331) Resp:  [16-18] 18 (07/13 0331) BP: (116-146)/(51-82) 125/67 (07/13 0331) SpO2:  [93 %-100 %] 93 % (07/13 0331)             Intake/Output this shift:   Intake/Output Summary (Last 24 hours) at 03/03/2021 0706 Last data filed at 03/03/2021 0514 Gross per 24 hour  Intake 1150 ml  Output 125 ml  Net 1025 ml    Constitutional :  alert, cooperative, appears stated age, and no distress  Respiratory:  clear to auscultation bilaterally  Cardiovascular:  regular rate and rhythm  Gastrointestinal: soft, non-tender; bowel sounds normal; no masses,  no organomegaly. JP with serosangunious discharge  Skin: Cool and moist. Incisions c/d/i  Psychiatric: Normal affect, non-agitated, not confused       LABS:  CMP Latest Ref Rng & Units 03/03/2021 03/02/2021 03/01/2021  Glucose 70 - 99 mg/dL 595(G) - 387(F)  BUN 8 - 23 mg/dL 64(P) - 23  Creatinine 0.44 - 1.00 mg/dL 3.29(J) 1.88(C) 1.66(A)  Sodium 135 - 145 mmol/L 140 - 141  Potassium 3.5 - 5.1 mmol/L 5.0 - 4.4  Chloride 98 - 111 mmol/L 103 - 101  CO2 22 - 32 mmol/L 27 - 30  Calcium 8.9 - 10.3 mg/dL 8.0(L) - 9.2  Total Protein 6.5 - 8.1 g/dL 6.4(L) 6.7 7.7  Total Bilirubin  0.3 - 1.2 mg/dL 0.7 0.9 1.0  Alkaline Phos 38 - 126 U/L 63 61 77  AST 15 - 41 U/L 17 17 21   ALT 0 - 44 U/L 7 6 7    CBC Latest Ref Rng & Units 03/03/2021 03/02/2021 03/01/2021  WBC 4.0 - 10.5 K/uL 10.6(H) 12.1(H) 15.0(H)  Hemoglobin 12.0 - 15.0 g/dL 10.5(L) 10.6(L) 12.4  Hematocrit 36.0 - 46.0 % 34.2(L) 34.5(L) 39.2  Platelets 150 - 400 K/uL 186 185 214    RADS: N/a Assessment:   S/p robo lap chole for acute cholecystitis.  Recovering as expected. Will start with clears and monitor.  Elevated Cr- hopefully will recover with IVF.  Will continue throughout today and recheck in am  Will also continue abx due to severity of infection

## 2021-03-03 NOTE — Plan of Care (Signed)
  Problem: Clinical Measurements: Goal: Will remain free from infection Outcome: Progressing Goal: Diagnostic test results will improve Outcome: Progressing Goal: Cardiovascular complication will be avoided Outcome: Progressing   Problem: Activity: Goal: Risk for activity intolerance will decrease Outcome: Progressing   Problem: Nutrition: Goal: Adequate nutrition will be maintained Outcome: Progressing   Problem: Safety: Goal: Ability to remain free from injury will improve Outcome: Progressing   Problem: Skin Integrity: Goal: Risk for impaired skin integrity will decrease Outcome: Progressing

## 2021-03-03 NOTE — Anesthesia Postprocedure Evaluation (Signed)
Anesthesia Post Note  Patient: DESANI SPRUNG  Procedure(s) Performed: XI ROBOTIC ASSISTED LAPAROSCOPIC CHOLECYSTECTOMY  Patient location during evaluation: PACU Anesthesia Type: General Level of consciousness: awake and alert Pain management: pain level controlled Vital Signs Assessment: post-procedure vital signs reviewed and stable Respiratory status: spontaneous breathing, nonlabored ventilation, respiratory function stable and patient connected to nasal cannula oxygen Cardiovascular status: blood pressure returned to baseline and stable Postop Assessment: no apparent nausea or vomiting Anesthetic complications: no   No notable events documented.   Last Vitals:  Vitals:   03/02/21 2341 03/03/21 0331  BP:  125/67  Pulse:  68  Resp:  18  Temp:  36.6 C  SpO2: 99% 93%    Last Pain:  Vitals:   03/03/21 0331  TempSrc: Oral  PainSc:                  Yevette Edwards

## 2021-03-03 NOTE — Op Note (Signed)
Preoperative diagnosis:  acute and cholecystitis  Postoperative diagnosis: same as above  Procedure: Robotic assisted Laparoscopic Cholecystectomy.   Anesthesia: GETA   Surgeon: Tynan Boesel  Specimen: Gallbladder  Complications: None  EBL: 75mL  Wound Classification: Clean Contaminated  Indications: see HPI  Findings: Critical view of safety noted Cystic duct and artery identified, ligated and divided, clips remained intact at end of procedure Adequate hemostasis  Description of procedure:  The patient was placed on the operating table in the supine position. SCDs placed, pre-op abx administered.  General anesthesia was induced and OG tube placed by anesthesia. A time-out was completed verifying correct patient, procedure, site, positioning, and implant(s) and/or special equipment prior to beginning this procedure. The abdomen was prepped and draped in the usual sterile fashion.    Veress needle was placed at the Palmer's point and insufflation was started after confirming a positive saline drop test and no immediate increase in abdominal pressure.  After reaching 15 mm, the Veress needle was removed and a 8 mm port was placed via optiview technique under umbilicus measured 20mm from gallbladder.  The abdomen was inspected.  Stomach noted to have 2mm superficial serosal tear from veress needle and extensive adhesions noted along known infraumbilical midline incision. Despite the adhesions, there was room to visualize placing the remaining ports.  Under direct vision, ports were placed in the following locations: One 12 mm patient left of the umbilicus, 8cm from the optiviewed port, one 8 mm port placed to the patient right of the umbilical port 8 cm apart.  1 additional 8 mm port placed lateral to the 12mm port.  Once ports were placed, The table was placed in the reverse Trendelenburg position with the right side up. The Xi platform was brought into the operative field and docked to  the ports successfully.  An endoscope was placed through the umbilical port, fenestrated grasper through the adjacent patient right port, prograsp to the far patient left port, and then a hook cautery in the left port.  Extensive inflammation noted where gallbladder was densely adhered to omentum, colon, stomach and duodenum.  The dome of the gallbladder was grasped with prograsp, passed and retracted over the dome of the liver. Adhesions between the gallbladder and omentum, duodenum and transverse colon were meticulously lysed via hook cautery and gentle traction. The infundibulum was eventually grasped with the fenestrated grasper and retracted toward the right lower quadrant. This maneuver exposed area of Calot's triangle. The peritoneum overlying the gallbladder infundibulum was then dissected using combination of Maryland dissector and electrocautery hook and the cystic duct and cystic artery eventually identified.  Critical view of safety with the liver bed clearly visible behind the duct and artery with no additional structures noted.  The cystic duct and cystic artery clipped and divided close to the gallbladder.  Cystic duct was extremely shortened but ICG was utilized to clearly visualize the CBD below the cystic duct, and away from the GB.      The gallbladder was then dissected from its peritoneal and liver bed attachments by electrocautery. Attention turned to the needle injury on the stomach and these was reinforced with lembert suture x2 and omental patch secured using 3-0 silk. Due to extensive inflammation, 15Fr blake drain placed through RLQ port around fossa and secured to skin using 3-0 nylon x2 due to very thin skin.    Hemostasis was checked prior to removing the hook cautery and the Endo Catch bag was then placed through the 12 mm   port and the gallbladder was removed.  The gallbladder was passed off the table as a specimen. There was no evidence of bleeding from the gallbladder fossa or  cystic artery or leakage of the bile from the cystic duct stump. Abdomen desufflated and secondary trocars were removed under direct vision. No bleeding was noted. The 12 mm port site closed 0 vicryl under direct vision.   All skin incisions then closed with subcuticular sutures of 4-0 monocryl and dressed with topical skin adhesive, drain dressed with drain sponge. The orogastric tube was removed and patient extubated.  The patient tolerated the procedure well and was taken to the postanesthesia care unit in stable condition.  All sponge and instrument count correct at end of procedure.  

## 2021-03-04 LAB — HEPATIC FUNCTION PANEL
ALT: <5 U/L (ref 0–44)
AST: 18 U/L (ref 15–41)
Albumin: 2.9 g/dL — ABNORMAL LOW (ref 3.5–5.0)
Alkaline Phosphatase: 59 U/L (ref 38–126)
Bilirubin, Direct: <0.1 mg/dL (ref 0.0–0.2)
Total Bilirubin: 0.5 mg/dL (ref 0.3–1.2)
Total Protein: 6.3 g/dL — ABNORMAL LOW (ref 6.5–8.1)

## 2021-03-04 LAB — BASIC METABOLIC PANEL
Anion gap: 7 (ref 5–15)
BUN: 47 mg/dL — ABNORMAL HIGH (ref 8–23)
CO2: 28 mmol/L (ref 22–32)
Calcium: 8 mg/dL — ABNORMAL LOW (ref 8.9–10.3)
Chloride: 106 mmol/L (ref 98–111)
Creatinine, Ser: 1.73 mg/dL — ABNORMAL HIGH (ref 0.44–1.00)
GFR, Estimated: 27 mL/min — ABNORMAL LOW (ref >60)
Glucose, Bld: 102 mg/dL — ABNORMAL HIGH (ref 70–99)
Potassium: 4.3 mmol/L (ref 3.5–5.1)
Sodium: 141 mmol/L (ref 135–145)

## 2021-03-04 LAB — CBC
HCT: 32.7 % — ABNORMAL LOW (ref 36.0–46.0)
Hemoglobin: 10.1 g/dL — ABNORMAL LOW (ref 12.0–15.0)
MCH: 29.3 pg (ref 26.0–34.0)
MCHC: 30.9 g/dL (ref 30.0–36.0)
MCV: 94.8 fL (ref 80.0–100.0)
Platelets: 210 10*3/uL (ref 150–400)
RBC: 3.45 MIL/uL — ABNORMAL LOW (ref 3.87–5.11)
RDW: 14.2 % (ref 11.5–15.5)
WBC: 9.3 10*3/uL (ref 4.0–10.5)
nRBC: 0 % (ref 0.0–0.2)

## 2021-03-04 LAB — SURGICAL PATHOLOGY

## 2021-03-04 MED ORDER — HYDROCODONE-ACETAMINOPHEN 5-325 MG PO TABS: 1.0000 | ORAL_TABLET | Freq: Four times a day (QID) | ORAL | 0 refills | Status: AC | PRN

## 2021-03-04 MED ORDER — ACETAMINOPHEN 325 MG PO TABS: 650.0000 mg | ORAL_TABLET | Freq: Three times a day (TID) | ORAL | 0 refills | Status: AC | PRN

## 2021-03-04 NOTE — Progress Notes (Signed)
Mobility Specialist - Progress Note   03/04/21 1200  Mobility  Activity Ambulated in room  Level of Assistance Minimal assist, patient does 75% or more  Assistive Device Front wheel walker  Distance Ambulated (ft) 20 ft  Mobility Ambulated with assistance in room  Mobility Response Tolerated well  Mobility performed by Mobility specialist  $Mobility charge 1 Mobility    Pt sitting in wheelchair upon arrival, utilizing RA. Pt agreeable to trial ambulation this date, does state she has RW at home. Pt stood from RW with minA. No Dizziness. VC for hand placement, sequencing, and safe-to-stand cueing. Pt carries over commands well. No LOB. Pt returned to wheelchair with all needs in reach.   Filiberto Pinks Mobility Specialist 03/04/21, 12:23 PM

## 2021-03-04 NOTE — Discharge Instructions (Addendum)
Laparoscopic Cholecystectomy, Care After This sheet gives you information about how to care for yourself after your procedure. Your doctor may also give you more specific instructions. If you have problems or questions, contact your doctor. Follow these instructions at home: Care for cuts from surgery (incisions)  Follow instructions from your doctor about how to take care of your cuts from surgery. Make sure you: Wash your hands with soap and water before you change your bandage (dressing). If you cannot use soap and water, use hand sanitizer. Change your bandage as told by your doctor. Leave stitches (sutures), skin glue, or skin tape (adhesive) strips in place. They may need to stay in place for 2 weeks or longer. If tape strips get loose and curl up, you may trim the loose edges. Do not remove tape strips completely unless your doctor says it is okay. Do not take baths, swim, or use a hot tub until your doctor says it is okay. OK TO SHOWER  Check your surgical cut area every day for signs of infection. Check for: More redness, swelling, or pain. More fluid or blood. Warmth. Pus or a bad smell. Activity Do not drive or use heavy machinery while taking prescription pain medicine. Do not play contact sports until your doctor says it is okay. Do not drive for 24 hours if you were given a medicine to help you relax (sedative). Rest as needed. Do not return to work or school until your doctor says it is okay. General instructions MEASURE JP DRAIN OUTPUT DAILY AND BRING TO FOLLOWUP APPOINTMENT  tylenol as needed for discomfort.    Use narcotics, if prescribed, only when tylenol is not enough to control pain.  325-650mg  every 8hrs to max of 3000mg /24hrs (including the 325mg  in every norco dose) for the tylenol.   To prevent or treat constipation while you are taking prescription pain medicine, your doctor may recommend that you: Drink enough fluid to keep your pee (urine) clear or pale  yellow. Take over-the-counter or prescription medicines. Eat foods that are high in fiber, such as fresh fruits and vegetables, whole grains, and beans. Limit foods that are high in fat and processed sugars, such as fried and sweet foods. Contact a doctor if: You develop a rash. You have more redness, swelling, or pain around your surgical cuts. You have more fluid or blood coming from your surgical cuts. Your surgical cuts feel warm to the touch. You have pus or a bad smell coming from your surgical cuts. You have a fever. One or more of your surgical cuts breaks open. Get help right away if: You have trouble breathing. You have chest pain. You have pain that is getting worse in your shoulders. You faint or feel dizzy when you stand. You have very bad pain in your belly (abdomen). You are sick to your stomach (nauseous) for more than one day. You have throwing up (vomiting) that lasts for more than one day. You have leg pain. This information is not intended to replace advice given to you by your health care provider. Make sure you discuss any questions you have with your health care provider. Document Released: 05/17/2008 Document Revised: 02/27/2016 Document Reviewed: 01/25/2016 Elsevier Interactive Patient Education  2019 04/29/2016.

## 2021-03-05 NOTE — Discharge Summary (Signed)
Physician Discharge Summary  Patient ID: Hayley Maynard MRN: [id number] DOB/AGE: 02/11/1922 87 y.o.  Admit date: 03/02/2021 Discharge date: 03/04/21  Admission Diagnoses: acute cholecystitis  Discharge Diagnoses:  Same as above  Discharged Condition: good  Hospital Course: admitted for above after complicated robo lap chole, see op note for details.  Recovered without any issues fortunately.  Pain minimal, tolerating diet by time of discharge  Consults: None  Discharge Exam: Blood pressure (!) 150/81, pulse 74, temperature 97.9 F (36.6 C), temperature source Oral, resp. rate 19, height 5' (1.524 m), weight 56.2 kg, SpO2 98 %. General appearance: alert, cooperative, and no distress GI: soft, non-tender; bowel sounds normal; no masses,  no organomegaly and incisions c/d/I and JP with serosanguinous drainage  Disposition:  Discharge disposition: 01-Home or Self Care       Discharge Instructions     Discharge patient   Complete by: As directed    D/C AFTER TOLERATING REGULAR DIET FOR BREAKFAST   Discharge disposition: 01-Home or Self Care   Discharge patient date: 03/04/2021      Allergies as of 03/04/2021       Reactions   Propoxyphene Other (See Comments)   Telmisartan-hctz Other (See Comments)        Medication List     STOP taking these medications    amoxicillin-clavulanate 875-125 MG tablet Commonly known as: Augmentin   bisacodyl 10 MG suppository Commonly known as: DULCOLAX   magnesium citrate Soln   multivitamin with minerals Tabs tablet   senna-docusate 8.6-50 MG tablet Commonly known as: Senokot-S       TAKE these medications    acetaminophen 325 MG tablet Commonly known as: Tylenol Take 2 tablets (650 mg total) by mouth every 8 (eight) hours as needed for mild pain.   amLODipine 10 MG tablet Commonly known as: NORVASC Take by mouth.   brimonidine 0.2 % ophthalmic solution Commonly known as: ALPHAGAN Place 1 drop into both eyes in  the morning and at bedtime.   dorzolamide-timolol 22.3-6.8 MG/ML ophthalmic solution Commonly known as: COSOPT Place 1 drop into both eyes 2 (two) times daily.   febuxostat 40 MG tablet Commonly known as: ULORIC Take 40 mg by mouth daily.   ferrous fumarate-b12-vitamic C-folic acid capsule Commonly known as: TRINSICON / FOLTRIN Take 1 capsule by mouth 2 (two) times daily after a meal.   furosemide 20 MG tablet Commonly known as: LASIX TAKE 1 TABLET(20 MG) BY MOUTH EVERY DAY FOR 1 TO 3 DAYS AS NEEDED FOR SWELLING   hydrALAZINE 50 MG tablet Commonly known as: APRESOLINE Take 1 tablet by mouth 2 (two) times daily.   HYDROcodone-acetaminophen 5-325 MG tablet Commonly known as: Norco Take 1 tablet by mouth every 6 (six) hours as needed for up to 3 days for severe pain.   latanoprost 0.005 % ophthalmic solution Commonly known as: XALATAN Place 1 drop into both eyes at bedtime.   losartan 50 MG tablet Commonly known as: COZAAR Take by mouth.   ondansetron 4 MG tablet Commonly known as: Zofran Take 1 tablet (4 mg total) by mouth every 8 (eight) hours as needed for up to 10 doses for nausea or vomiting.   simvastatin 20 MG tablet Commonly known as: ZOCOR Take 20 mg by mouth daily.   vitamin B-12 1000 MCG tablet Commonly known as: CYANOCOBALAMIN Take 1,000 mcg by mouth daily.        Follow-up Information     Liz Pinho, DO Follow up in 1 week(s).  Specialty: Surgery Why: for JP drain removal Contact information: [address] [place] [address] [phone]                  Total time spent arranging discharge was >37min. Signed: Sung Amabile 03/05/2021, 11:46 AM

## 2021-03-21 ENCOUNTER — Observation Stay: Payer: Medicare Other

## 2021-03-21 ENCOUNTER — Encounter: Payer: Self-pay | Admitting: Internal Medicine

## 2021-03-21 ENCOUNTER — Emergency Department: Payer: Medicare Other

## 2021-03-21 ENCOUNTER — Observation Stay
Admission: EM | Admit: 2021-03-21 | Discharge: 2021-03-22 | Disposition: A | Discharge status: Home / Self Care | Payer: Medicare Other | Attending: Internal Medicine | Admitting: Internal Medicine

## 2021-03-21 DIAGNOSIS — I10 Essential (primary) hypertension: Secondary | ICD-10-CM | POA: Diagnosis not present

## 2021-03-21 DIAGNOSIS — I2510 Atherosclerotic heart disease of native coronary artery without angina pectoris: Secondary | ICD-10-CM | POA: Diagnosis not present

## 2021-03-21 DIAGNOSIS — Z87891 Personal history of nicotine dependence: Secondary | ICD-10-CM | POA: Insufficient documentation

## 2021-03-21 DIAGNOSIS — Z20822 Contact with and (suspected) exposure to covid-19: Secondary | ICD-10-CM | POA: Diagnosis not present

## 2021-03-21 DIAGNOSIS — Z951 Presence of aortocoronary bypass graft: Secondary | ICD-10-CM | POA: Diagnosis not present

## 2021-03-21 DIAGNOSIS — Z79899 Other long term (current) drug therapy: Secondary | ICD-10-CM | POA: Insufficient documentation

## 2021-03-21 DIAGNOSIS — N183 Chronic kidney disease, stage 3 unspecified: Secondary | ICD-10-CM | POA: Diagnosis not present

## 2021-03-21 DIAGNOSIS — E782 Mixed hyperlipidemia: Secondary | ICD-10-CM | POA: Diagnosis present

## 2021-03-21 DIAGNOSIS — I639 Cerebral infarction, unspecified: Principal | ICD-10-CM | POA: Diagnosis present

## 2021-03-21 DIAGNOSIS — R519 Headache, unspecified: Secondary | ICD-10-CM

## 2021-03-21 DIAGNOSIS — I1310 Hypertensive heart and chronic kidney disease without heart failure, with stage 1 through stage 4 chronic kidney disease, or unspecified chronic kidney disease: Secondary | ICD-10-CM | POA: Diagnosis not present

## 2021-03-21 LAB — BASIC METABOLIC PANEL
Anion gap: 5 (ref 5–15)
BUN: 15 mg/dL (ref 8–23)
CO2: 30 mmol/L (ref 22–32)
Calcium: 9.3 mg/dL (ref 8.9–10.3)
Chloride: 104 mmol/L (ref 98–111)
Creatinine, Ser: 1.3 mg/dL — ABNORMAL HIGH (ref 0.44–1.00)
GFR, Estimated: 37 mL/min — ABNORMAL LOW (ref >60)
Glucose, Bld: 128 mg/dL — ABNORMAL HIGH (ref 70–99)
Potassium: 4.9 mmol/L (ref 3.5–5.1)
Sodium: 139 mmol/L (ref 135–145)

## 2021-03-21 LAB — CBC
HCT: 35.5 % — ABNORMAL LOW (ref 36.0–46.0)
Hemoglobin: 11 g/dL — ABNORMAL LOW (ref 12.0–15.0)
MCH: 29.4 pg (ref 26.0–34.0)
MCHC: 31 g/dL (ref 30.0–36.0)
MCV: 94.9 fL (ref 80.0–100.0)
Platelets: 215 10*3/uL (ref 150–400)
RBC: 3.74 MIL/uL — ABNORMAL LOW (ref 3.87–5.11)
RDW: 14.6 % (ref 11.5–15.5)
WBC: 5.2 10*3/uL (ref 4.0–10.5)
nRBC: 0 % (ref 0.0–0.2)

## 2021-03-21 LAB — PROTIME-INR
INR: 1.1 (ref 0.8–1.2)
Prothrombin Time: 14.7 seconds (ref 11.4–15.2)

## 2021-03-21 LAB — APTT: aPTT: 26 seconds (ref 24–36)

## 2021-03-21 MED ORDER — ACETAMINOPHEN 650 MG RE SUPP: 650.0000 mg | RECTAL | Status: DC | PRN

## 2021-03-21 MED ORDER — ASPIRIN 325 MG PO TABS
325.0000 mg | ORAL_TABLET | Freq: Every day | ORAL | Status: DC
  Filled 2021-03-21: qty 1

## 2021-03-21 MED ORDER — BUTALBITAL-APAP-CAFFEINE 50-325-40 MG PO TABS
2.0000 | ORAL_TABLET | Freq: Once | ORAL | Status: AC
  Administered 2021-03-21: 2 via ORAL
  Filled 2021-03-21: qty 2

## 2021-03-21 MED ORDER — DORZOLAMIDE HCL-TIMOLOL MAL 2-0.5 % OP SOLN
1.0000 [drp] | Freq: Two times a day (BID) | OPHTHALMIC | Status: DC
  Administered 2021-03-22: 1 [drp] via OPHTHALMIC
  Filled 2021-03-21: qty 10

## 2021-03-21 MED ORDER — STROKE: EARLY STAGES OF RECOVERY BOOK: Freq: Once | Status: AC

## 2021-03-21 MED ORDER — BRIMONIDINE TARTRATE 0.2 % OP SOLN
1.0000 [drp] | Freq: Three times a day (TID) | OPHTHALMIC | Status: DC
  Administered 2021-03-21 – 2021-03-22 (×2): 1 [drp] via OPHTHALMIC
  Filled 2021-03-21: qty 5

## 2021-03-21 MED ORDER — SIMVASTATIN 20 MG PO TABS: 20.0000 mg | ORAL_TABLET | Freq: Every day | ORAL | Status: DC

## 2021-03-21 MED ORDER — SODIUM CHLORIDE 0.9 % IV SOLN: INTRAVENOUS | Status: DC

## 2021-03-21 MED ORDER — ONDANSETRON 4 MG PO TBDP
ORAL_TABLET | ORAL | Status: AC
  Administered 2021-03-21: 4 mg
  Filled 2021-03-21: qty 1

## 2021-03-21 MED ORDER — LATANOPROST 0.005 % OP SOLN
1.0000 [drp] | Freq: Every day | OPHTHALMIC | Status: DC
  Filled 2021-03-21: qty 2.5

## 2021-03-21 MED ORDER — ACETAMINOPHEN 325 MG PO TABS: 650.0000 mg | ORAL_TABLET | ORAL | Status: DC | PRN

## 2021-03-21 MED ORDER — ENOXAPARIN SODIUM 30 MG/0.3ML IJ SOSY
30.0000 mg | PREFILLED_SYRINGE | INTRAMUSCULAR | Status: DC
  Administered 2021-03-22: 09:00:00 30 mg via SUBCUTANEOUS
  Filled 2021-03-21: qty 0.3

## 2021-03-21 MED ORDER — ASPIRIN 81 MG PO CHEW
324.0000 mg | CHEWABLE_TABLET | Freq: Once | ORAL | Status: AC
  Administered 2021-03-21: 324 mg via ORAL
  Filled 2021-03-21: qty 4

## 2021-03-21 MED ORDER — FEBUXOSTAT 40 MG PO TABS
40.0000 mg | ORAL_TABLET | Freq: Every day | ORAL | Status: DC
  Administered 2021-03-22: 40 mg via ORAL
  Filled 2021-03-21: qty 1

## 2021-03-21 MED ORDER — ASPIRIN 300 MG RE SUPP: 300.0000 mg | Freq: Every day | RECTAL | Status: DC

## 2021-03-21 MED ORDER — HYDRALAZINE HCL 20 MG/ML IJ SOLN: 10.0000 mg | INTRAMUSCULAR | Status: DC | PRN

## 2021-03-21 MED ORDER — ACETAMINOPHEN 160 MG/5ML PO SOLN
650.0000 mg | ORAL | Status: DC | PRN
  Filled 2021-03-21: qty 20.3

## 2021-03-21 NOTE — ED Triage Notes (Signed)
FIRST NURSE NOTE:   Sent here from [facility] for severe HA, nausea, and confusion. Family with patient.

## 2021-03-21 NOTE — ED Provider Notes (Signed)
[facility] Emergency Department Provider Note ____________________________________________   Event Date/Time   First MD Initiated Contact with Patient 03/21/21 2034     (approximate)  I have reviewed the triage vital signs and the nursing notes.  HISTORY  Chief Complaint Headache   HPI DESTINEY Maynard is a 87 y.o. femalewho presents to the ED for evaluation of headache.  Chart review indicates history of HTN, HLD, CAD and CKD. 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 Patient lives at home with her daughter, ambulates with a walker.  Daughter brings patient to the ED for evaluation of bad headache throughout the day today.  Patient took a single dose of BC powder this morning with no relief, and due to this poor relief they went to a local urgent care who referred them to the ED.  By the time that I see the patient, prior to intervention, she reports improving headache and feeling better.  They report nausea intermittently throughout the day without abdominal pain, and reports 2 episodes of nonbloody nonbilious emesis.  Past Medical History:  Diagnosis Date   Arthritis    feet   CKD (chronic kidney disease), stage III (HCC)    Coronary artery disease    Glaucoma    Hyperlipidemia    Hypertension    LVH (left ventricular hypertrophy)    Wears dentures    full upper and lower    Patient Active Problem List   Diagnosis Date Noted   Acute cholecystitis 03/02/2021   Chronic venous insufficiency 11/26/2020   Lymphedema 11/26/2020   Pain and swelling of lower leg 11/26/2020   Hammer toes, bilateral 11/09/2020   Displaced fracture of greater trochanter of right femur, initial encounter for closed fracture (HCC) 03/20/2020   CAP (community acquired pneumonia) 03/20/2020   Bilateral carotid artery stenosis 02/19/2020   Chronic kidney disease, stage III (moderate) (HCC) 10/28/2019    Anemia 11/08/2018   CAD (coronary artery disease) 11/08/2018   Mixed hyperlipidemia 11/08/2018   Moderate mitral insufficiency 03/30/2017   CKD (chronic kidney disease) stage 3, GFR 30-59 ml/min 01/27/2015   LVH (left ventricular hypertrophy) due to hypertensive disease, without heart failure 01/27/2015   Benign essential hypertension 01/09/2015    Past Surgical History:  Procedure Laterality Date   ABDOMINAL HYSTERECTOMY     CATARACT EXTRACTION W/ INTRAOCULAR LENS  IMPLANT, BILATERAL     CORONARY ARTERY BYPASS GRAFT     over 10 yrs ago (per pt)   INTRAMEDULLARY (IM) NAIL INTERTROCHANTERIC Right 03/20/2020   Procedure: INTRAMEDULLARY (IM) NAIL INTERTROCHANTRIC;  Surgeon: Kennedy Bucker, MD;  Location: [facility] ORS;  Service: Orthopedics;  Laterality: Right;   PHOTOCOAGULATION WITH LASER Right 09/26/2018   Procedure: PHOTOCOAGULATION WITH LASER;  Surgeon: Lockie Mola, MD;  Location: [facility];  Service: Ophthalmology;  Laterality: Right;  laser settings: , 31.3% duty cycle, 120 seconds    Prior to Admission medications   Medication Sig Start Date End Date Taking? Authorizing Provider  acetaminophen (TYLENOL) 325 MG tablet Take 2 tablets (650 mg total) by mouth every 8 (eight) hours as needed for mild pain. 03/04/21 04/03/21  Tonna Boehringer, Isami, DO  amLODipine (NORVASC) 10 MG tablet Take by mouth. 04/28/20   [provider]  brimonidine (ALPHAGAN) 0.2 % ophthalmic solution Place 1 drop into both eyes in the morning and at bedtime.     [provider]  dorzolamide-timolol (COSOPT) 22.3-6.8 MG/ML ophthalmic solution Place 1  drop into both eyes 2 (two) times daily. 04/29/19   [provider]  febuxostat (ULORIC) 40 MG tablet Take 40 mg by mouth daily.    [provider]  ferrous fumarate-b12-vitamic C-folic acid (TRINSICON / FOLTRIN) capsule Take 1 capsule by mouth 2 (two) times daily after a meal. 03/24/20   Alwyn Ren, MD  furosemide (LASIX)  20 MG tablet TAKE 1 TABLET(20 MG) BY MOUTH EVERY DAY FOR 1 TO 3 DAYS AS NEEDED FOR SWELLING 11/16/20   [provider]  hydrALAZINE (APRESOLINE) 50 MG tablet Take 1 tablet by mouth 2 (two) times daily. 09/02/19   [provider]  latanoprost (XALATAN) 0.005 % ophthalmic solution Place 1 drop into both eyes at bedtime. 04/28/20   [provider]  losartan (COZAAR) 50 MG tablet Take by mouth. 04/28/20   [provider]  ondansetron (ZOFRAN) 4 MG tablet Take 1 tablet (4 mg total) by mouth every 8 (eight) hours as needed for up to 10 doses for nausea or vomiting. 03/01/21   Gilles Chiquito, MD  simvastatin (ZOCOR) 20 MG tablet Take 20 mg by mouth daily.    [provider]  vitamin B-12 (CYANOCOBALAMIN) 1000 MCG tablet Take 1,000 mcg by mouth daily.    [provider]    Allergies Propoxyphene and Telmisartan-hctz  No family history on file.  Social History Social History   Tobacco Use   Smoking status: Former    Types: Cigarettes    Quit date: 02/20/2020    Years since quitting: 1.0   Smokeless tobacco: Never   Tobacco comments:    1 pack last about 6 weeks  Vaping Use   Vaping Use: Never used  Substance Use Topics   Alcohol use: Not Currently   Drug use: Not Currently    Review of Systems  Constitutional: No fever/chills Eyes: No visual changes. ENT: No sore throat. Cardiovascular: Denies chest pain. Respiratory: Denies shortness of breath. Gastrointestinal: No abdominal pain.  No diarrhea.  No constipation. Positive for nausea and vomiting Genitourinary: Negative for dysuria. Musculoskeletal: Negative for back pain. Skin: Negative for rash. Neurological: Negative for  focal weakness or numbness. Positive for headache ____________________________________________   PHYSICAL EXAM:  VITAL SIGNS: Vitals:   03/21/21 2030  BP: (!) 173/90  Pulse: 93  Resp: 16  Temp: 98 F (36.7 C)  SpO2: 92%      Constitutional: Alert  and oriented. Well appearing and in no acute distress. Eyes: Conjunctivae are normal. PERRL. EOMI. Head: Atraumatic. Nose: No congestion/rhinnorhea. Mouth/Throat: Mucous membranes are moist.  Oropharynx non-erythematous. Neck: No stridor. No cervical spine tenderness to palpation. Cardiovascular: Normal rate, regular rhythm. Grossly normal heart sounds.  Good peripheral circulation. Respiratory: Normal respiratory effort.  No retractions. Lungs CTAB. Gastrointestinal: Soft , nondistended, nontender to palpation. No CVA tenderness. Musculoskeletal: No lower extremity tenderness nor edema.  No joint effusions. No signs of acute trauma. Neurologic:  Normal speech and language.  Poor vision to the right eye, reportedly at baseline per the patient. Visual field testing is difficult considering her hard of hearing and underlying poor vision to the right eye. Strength and sensation intact in all 4 extremities. Skin:  Skin is warm, dry and intact. No rash noted. Psychiatric: Mood and affect are normal. Speech and behavior are normal.  ____________________________________________   LABS (all labs ordered are listed, but only abnormal results are displayed)  Labs Reviewed  BASIC METABOLIC PANEL - Abnormal; Notable for the following components:  Result Value   Glucose, Bld 128 (*)    Creatinine, Ser 1.30 (*)    GFR, Estimated 37 (*)    All other components within normal limits  CBC - Abnormal; Notable for the following components:   RBC 3.74 (*)    Hemoglobin 11.0 (*)    HCT 35.5 (*)    All other components within normal limits  RESP PANEL BY RT-PCR (FLU A&B, COVID) ARPGX2  URINALYSIS, COMPLETE (UACMP) WITH MICROSCOPIC  PROTIME-INR  APTT   ____________________________________________  12 Lead EKG   ____________________________________________  RADIOLOGY  ED MD interpretation: CT head reviewed by me with right-sided occipital infarction without bleed  Official radiology  report(s): CT Head Wo Contrast  Result Date: 03/21/2021 CLINICAL DATA:  headache this morning, took [facility] powder without relief. disoriented today to the daughter who lives with her. Nausea, vomiting x 2 EXAM: CT HEAD WITHOUT CONTRAST TECHNIQUE: Contiguous axial images were obtained from the base of the skull through the vertex without intravenous contrast. COMPARISON:  CT head 03/19/2020 FINDINGS: Brain: Cerebral ventricle sizes are concordant with the degree of cerebral volume loss. Patchy and confluent areas of decreased attenuation are noted throughout the deep and periventricular white matter of the cerebral hemispheres bilaterally, compatible with chronic microvascular ischemic disease. Interval development of right occipital loss of gray-white matter differentiation. Question mild associated edema. No parenchymal hemorrhage. No mass lesion. No extra-axial collection. No mass effect or midline shift. No hydrocephalus. Basilar cisterns are patent. Vascular: No hyperdense vessel. Atherosclerotic calcifications are present within the cavernous internal carotid and vertebral arteries. Skull: No acute fracture or focal lesion. Sinuses/Orbits: Paranasal sinuses and mastoid air cells are clear. Bilateral lens replacement. Otherwise the orbits are unremarkable. Other: None. IMPRESSION: 1. Age-indeterminate, likely subacute, right occipital infarction. 2. No acute intracranial hemorrhage. These results were called by telephone at the time of interpretation on 03/21/2021 at 9:50 pm to provider [facility] , who verbally acknowledged these results. Electronically Signed   By: Tish Frederickson M.D.   On: 03/21/2021 21:52    ____________________________________________   PROCEDURES and INTERVENTIONS  Procedure(s) performed (including Critical Care):  .1-3 Lead EKG Interpretation  Date/Time: 03/21/2021 10:30 PM Performed by: Delton Prairie, MD Authorized by: Delton Prairie, MD     Interpretation: normal     ECG  rate:  90   ECG rate assessment: normal     Rhythm: sinus rhythm     Ectopy: none     Conduction: normal    Medications  aspirin chewable tablet 324 mg (has no administration in time range)  butalbital-acetaminophen-caffeine (FIORICET) 50-325-40 MG per tablet 2 tablet (2 tablets Oral Given 03/21/21 2149)  ondansetron (ZOFRAN-ODT) 4 MG disintegrating tablet (4 mg  Given 03/21/21 2154)    ____________________________________________   MDM / ED COURSE   Quite sharp and functional 87 year old woman presents from home with 1 day of headache and emesis, with evidence of acute or subacute stroke on CT head, requiring medical admission for stroke work-up.  Examination without evidence of neurologic or vascular deficits to the extremities, but poor vision and right-sided visual field cuts are present and difficult to distinguish from her baseline poor vision.  She has resolving headache with Fioricet and largely looks quite well.  CT head obtained and without evidence of mass or bleed, but does show evidence of recent acute stroke to her right occipital lobe.  Shared decision making with patient and daughter yields plan for medical work-up to assess for reversible causes  Clinical Course as  of 03/21/21 2236  Wynelle LinkSun Mar 21, 2021  2224 Discussed CT results with patient and daughter.  We discussed medical admission for stroke work-up and they are in agreement. [DS]  2234 Discussed with hospitalist who agrees to admit. [DS]    Clinical Course User Index [DS] Delton PrairieSmith, Nicky Milhouse, MD    ____________________________________________   FINAL CLINICAL IMPRESSION(S) / ED DIAGNOSES  Final diagnoses:  Bad headache  Occipital stroke [facility](HCC)     ED Discharge Orders     None        Ivor Kishi   Note:  This document was prepared using Dragon voice recognition software and may include unintentional dictation errors.    Delton PrairieSmith, Jazon Jipson, MD 03/21/21 [phone]

## 2021-03-21 NOTE — ED Triage Notes (Signed)
Pt was c/o headache this morning, took [facility] powder without relief. She was still c/o headache this afternoon, daughter took her to UC, sent her here for further eval. She seems disoriented today to the daughter who lives with her. Nausea, vomiting x 2.

## 2021-03-21 NOTE — H&P (Signed)
History and Physical    Hayley Maynard PXT:[id number] DOB: 1922/06/14 DOA: 03/21/2021  PCP: Jerl Mina, MD  Patient coming from: Home.  Chief Complaint: Headache with nausea vomiting.  HPI: Hayley Maynard is a 87 y.o. female with history of hypertension, hyperlipidemia, gout, chronic and disease stage III, anemia who had a recent lap cholecystectomy about 2 weeks ago started experiencing headache around the right eye with nausea vomiting since he woke up this morning around 7 AM.  Denies any weakness of the extremities.  Patient has blindness in the right eye but also had some poor vision on the left eye since this morning.  Had gone to the urgent care and was referred to the ER.  ED Course: In the ER CT head shows possible subacute infarct of the right occipital area.  Patient is moving all extremities 5 x 5.  Has poor vision on the left eye and is blind on the right eye.  Patient is being admitted for further work-up of stroke.  COVID test is pending.  EKG is pending.  Labs are largely at baseline.  Review of Systems: As per HPI, rest all negative.   Past Medical History:  Diagnosis Date   Arthritis    feet   CKD (chronic kidney disease), stage III (HCC)    Coronary artery disease    Glaucoma    Hyperlipidemia    Hypertension    LVH (left ventricular hypertrophy)    Wears dentures    full upper and lower    Past Surgical History:  Procedure Laterality Date   ABDOMINAL HYSTERECTOMY     CATARACT EXTRACTION W/ INTRAOCULAR LENS  IMPLANT, BILATERAL     CORONARY ARTERY BYPASS GRAFT     over 10 yrs ago (per pt)   INTRAMEDULLARY (IM) NAIL INTERTROCHANTERIC Right 03/20/2020   Procedure: INTRAMEDULLARY (IM) NAIL INTERTROCHANTRIC;  Surgeon: Kennedy Bucker, MD;  Location: [facility] ORS;  Service: Orthopedics;  Laterality: Right;   PHOTOCOAGULATION WITH LASER Right 09/26/2018   Procedure: PHOTOCOAGULATION WITH LASER;  Surgeon: Lockie Mola, MD;  Location: [facility];  Service:  Ophthalmology;  Laterality: Right;  laser settings: , 31.3% duty cycle, 120 seconds     reports that she quit smoking about 12 months ago. Her smoking use included cigarettes. She has never used smokeless tobacco. She reports previous alcohol use. She reports previous drug use.  Allergies  Allergen Reactions   Propoxyphene Other (See Comments)   Telmisartan-Hctz Other (See Comments)    History reviewed. No pertinent family history.  Prior to Admission medications   Medication Sig Start Date End Date Taking? Authorizing Provider  acetaminophen (TYLENOL) 325 MG tablet Take 2 tablets (650 mg total) by mouth every 8 (eight) hours as needed for mild pain. 03/04/21 04/03/21  Tonna Boehringer, Isami, DO  amLODipine (NORVASC) 10 MG tablet Take by mouth. 04/28/20   [provider]  brimonidine (ALPHAGAN) 0.2 % ophthalmic solution Place 1 drop into both eyes in the morning and at bedtime.     [provider]  dorzolamide-timolol (COSOPT) 22.3-6.8 MG/ML ophthalmic solution Place 1 drop into both eyes 2 (two) times daily. 04/29/19   [provider]  febuxostat (ULORIC) 40 MG tablet Take 40 mg by mouth daily.    [provider]  ferrous fumarate-b12-vitamic C-folic acid (TRINSICON / FOLTRIN) capsule Take 1 capsule by mouth 2 (two) times daily after a meal. 03/24/20   Alwyn Ren, MD  furosemide (LASIX) 20 MG tablet TAKE 1 TABLET(20 MG) BY  MOUTH EVERY DAY FOR 1 TO 3 DAYS AS NEEDED FOR SWELLING 11/16/20   [provider]  hydrALAZINE (APRESOLINE) 50 MG tablet Take 1 tablet by mouth 2 (two) times daily. 09/02/19   [provider]  latanoprost (XALATAN) 0.005 % ophthalmic solution Place 1 drop into both eyes at bedtime. 04/28/20   [provider]  losartan (COZAAR) 50 MG tablet Take by mouth. 04/28/20   [provider]  ondansetron (ZOFRAN) 4 MG tablet Take 1 tablet (4 mg total) by mouth every 8 (eight) hours as needed for up to 10 doses for  nausea or vomiting. 03/01/21   Gilles Chiquito, MD  simvastatin (ZOCOR) 20 MG tablet Take 20 mg by mouth daily.    [provider]  vitamin B-12 (CYANOCOBALAMIN) 1000 MCG tablet Take 1,000 mcg by mouth daily.    [provider]    Physical Exam: Constitutional: Moderately built and nourished. Vitals:   03/21/21 2030 03/21/21 2245 03/21/21 2300  BP: (!) 173/90 135/64 (!) 147/96  Pulse: 93 85 81  Resp: 16 (!) 9 (!) 23  Temp: 98 F (36.7 C)    TempSrc: Oral    SpO2: 92% (!) 89% 94%   Eyes: Anicteric no pallor. ENMT: No discharge from the ears eyes nose and mouth. Neck: No mass felt.  No neck rigidity. Respiratory: No rhonchi or crepitations. Cardiovascular: S1-S2 heard. Abdomen: Soft nontender bowel sounds present. Musculoskeletal: No edema. Skin: No rash. Neurologic: Alert awake oriented to time place and person.  Moving all extremities 5 x 5.  Blind on right eye poor vision on the left eye.  No facial asymmetry.  Tongue is midline. Psychiatric: Appears normal.  Normal affect.   Labs on Admission: I have personally reviewed following labs and imaging studies  CBC: Recent Labs  Lab 03/21/21 2033  WBC 5.2  HGB 11.0*  HCT 35.5*  MCV 94.9  PLT 215   Basic Metabolic Panel: Recent Labs  Lab 03/21/21 2033  NA 139  K 4.9  CL 104  CO2 30  GLUCOSE 128*  BUN 15  CREATININE 1.30*  CALCIUM 9.3   GFR: CrCl cannot be calculated (Unknown ideal weight.). Liver Function Tests: No results for input(s): AST, ALT, ALKPHOS, BILITOT, PROT, ALBUMIN in the last 168 hours. No results for input(s): LIPASE, AMYLASE in the last 168 hours. No results for input(s): AMMONIA in the last 168 hours. Coagulation Profile: No results for input(s): INR, PROTIME in the last 168 hours. Cardiac Enzymes: No results for input(s): CKTOTAL, CKMB, CKMBINDEX, TROPONINI in the last 168 hours. BNP (last 3 results) No results for input(s): PROBNP in the last 8760 hours. HbA1C: No  results for input(s): HGBA1C in the last 72 hours. CBG: No results for input(s): GLUCAP in the last 168 hours. Lipid Profile: No results for input(s): CHOL, HDL, LDLCALC, TRIG, CHOLHDL, LDLDIRECT in the last 72 hours. Thyroid Function Tests: No results for input(s): TSH, T4TOTAL, FREET4, T3FREE, THYROIDAB in the last 72 hours. Anemia Panel: No results for input(s): VITAMINB12, FOLATE, FERRITIN, TIBC, IRON, RETICCTPCT in the last 72 hours. Urine analysis:    Component Value Date/Time   COLORURINE YELLOW (A) 03/01/2021 1513   APPEARANCEUR CLEAR (A) 03/01/2021 1513   LABSPEC 1.015 03/01/2021 1513   PHURINE 5.0 03/01/2021 1513   GLUCOSEU NEGATIVE 03/01/2021 1513   HGBUR NEGATIVE 03/01/2021 1513   BILIRUBINUR NEGATIVE 03/01/2021 1513   KETONESUR NEGATIVE 03/01/2021 1513   PROTEINUR 100 (A) 03/01/2021 1513   NITRITE NEGATIVE 03/01/2021 1513  LEUKOCYTESUR NEGATIVE 03/01/2021 1513   Sepsis Labs: @LABRCNTIP (procalcitonin:4,lacticidven:4) )No results found for this or any previous visit (from the past 240 hour(s)).   Radiological Exams on Admission: CT Head Wo Contrast  Result Date: 03/21/2021 CLINICAL DATA:  headache this morning, took [facility] powder without relief. disoriented today to the daughter who lives with her. Nausea, vomiting x 2 EXAM: CT HEAD WITHOUT CONTRAST TECHNIQUE: Contiguous axial images were obtained from the base of the skull through the vertex without intravenous contrast. COMPARISON:  CT head 03/19/2020 FINDINGS: Brain: Cerebral ventricle sizes are concordant with the degree of cerebral volume loss. Patchy and confluent areas of decreased attenuation are noted throughout the deep and periventricular white matter of the cerebral hemispheres bilaterally, compatible with chronic microvascular ischemic disease. Interval development of right occipital loss of gray-white matter differentiation. Question mild associated edema. No parenchymal hemorrhage. No mass lesion. No extra-axial  collection. No mass effect or midline shift. No hydrocephalus. Basilar cisterns are patent. Vascular: No hyperdense vessel. Atherosclerotic calcifications are present within the cavernous internal carotid and vertebral arteries. Skull: No acute fracture or focal lesion. Sinuses/Orbits: Paranasal sinuses and mastoid air cells are clear. Bilateral lens replacement. Otherwise the orbits are unremarkable. Other: None. IMPRESSION: 1. Age-indeterminate, likely subacute, right occipital infarction. 2. No acute intracranial hemorrhage. These results were called by telephone at the time of interpretation on 03/21/2021 at 9:50 pm to provider [facility] , who verbally acknowledged these results. Electronically Signed   By: [facility] M.D.   On: 03/21/2021 21:52      Assessment/Plan Principal Problem:   Acute CVA (cerebrovascular accident) [facility]) Active Problems:   Benign essential hypertension   CKD (chronic kidney disease) stage 3, GFR 30-59 ml/min   Mixed hyperlipidemia    Acute CVA -discussed with on-call neurologist [facility].  We will be getting MRI brain MRA of the head without contrast and carotid Doppler 2D echo neurochecks check hemoglobin A1c lipid panel.  If patient passes stroke swallow we will keep patient on diet.  Physical therapy consult.  Neurology consult. Hypertension -since patient is acute CVA will allow for permissive hypertension.  As needed IV hydralazine for systolic blood pressure more than 220. Hyperlipidemia on statins. Chronic kidney disease stage III creatinine appears to be at baseline. Chronic anemia likely from renal disease follow CBC. Recent lap cholecystectomy.  COVID test is pending.  EKG is pending.   DVT prophylaxis: Lovenox. Code Status: Full code. Family Communication: Patient's daughter. Disposition Plan: Home when stable. Consults called: Neurology. Admission status: Observation.   Jerrell Belfast MD [facility] Pager [phone].  If 7PM-7AM, please contact night-coverage [email or web address] Password [facility]  03/21/2021, 11:19 PM

## 2021-03-22 ENCOUNTER — Observation Stay
Admit: 2021-03-22 | Discharge: 2021-03-22 | Disposition: A | Discharge status: Home / Self Care | Payer: Medicare Other | Attending: Internal Medicine | Admitting: Internal Medicine

## 2021-03-22 DIAGNOSIS — I10 Essential (primary) hypertension: Secondary | ICD-10-CM | POA: Diagnosis not present

## 2021-03-22 DIAGNOSIS — I639 Cerebral infarction, unspecified: Secondary | ICD-10-CM | POA: Diagnosis not present

## 2021-03-22 LAB — RESP PANEL BY RT-PCR (FLU A&B, COVID) ARPGX2
Influenza A by PCR: NEGATIVE
Influenza B by PCR: NEGATIVE
SARS Coronavirus 2 by RT PCR: NEGATIVE

## 2021-03-22 LAB — URINALYSIS, COMPLETE (UACMP) WITH MICROSCOPIC
Bacteria, UA: NONE SEEN
Bilirubin Urine: NEGATIVE
Glucose, UA: NEGATIVE mg/dL
Hgb urine dipstick: NEGATIVE
Ketones, ur: NEGATIVE mg/dL
Nitrite: NEGATIVE
Protein, ur: 30 mg/dL — AB
Specific Gravity, Urine: 1.018 (ref 1.005–1.030)
pH: 5 (ref 5.0–8.0)

## 2021-03-22 LAB — LIPID PANEL
Cholesterol: 150 mg/dL (ref 0–200)
HDL: 75 mg/dL (ref >40)
LDL Cholesterol: 67 mg/dL (ref 0–99)
Total CHOL/HDL Ratio: 2 RATIO
Triglycerides: 39 mg/dL (ref <150)
VLDL: 8 mg/dL (ref 0–40)

## 2021-03-22 LAB — ECHOCARDIOGRAM COMPLETE
AR max vel: 1.68 cm2
AV Area VTI: 1.43 cm2
AV Area mean vel: 1.55 cm2
AV Mean grad: 12 mmHg
AV Peak grad: 20.6 mmHg
Ao pk vel: 2.27 m/s
Area-P 1/2: 3.97 cm2
Height: 61 in
MV VTI: 2.24 cm2
S' Lateral: 2.4 cm
Weight: 1776 oz

## 2021-03-22 LAB — HEMOGLOBIN A1C
Hgb A1c MFr Bld: 6.2 % — ABNORMAL HIGH (ref 4.8–5.6)
Mean Plasma Glucose: 131.24 mg/dL

## 2021-03-22 MED ORDER — ASPIRIN EC 81 MG PO TBEC: 81.0000 mg | DELAYED_RELEASE_TABLET | Freq: Every day | ORAL | 2 refills | Status: AC

## 2021-03-22 NOTE — Consult Note (Addendum)
Neurology Consultation Reason for Consult: Stroke on CT Referring Physician: Toniann Fail, a  CC: Headache  History is obtained from: Patient  HPI: Hayley Maynard is a 87 y.o. female who had a significant headache yesterday and was having some nausea and vomiting and therefore presented to the emergency department.  She has a long history of migraines and describes the headache that she had yesterday is retro-orbital in location and associated with nausea.  She did not have significant photophobia.  She states that it was worse than her typical migraines and typically they go away with [facility] powder which this one did not.  It has, however, resolved in the interim.  Due to her headache a head CT was performed which demonstrated what was felt to be a subacute infarct in the right occipital lobe.  MRI, however, has confirmed that this is actually chronic.  After speaking with her, she had a fall about a year ago, she states that since that time she has had more difficulty with her handwriting but she has not noticed any visual difficulties.  She had a head CT at that time which was negative.   LKW: I suspect about a year ago tpa given?: no, out of window   ROS: A 14 point ROS was performed and is negative except as noted in the HPI.   Past Medical History:  Diagnosis Date   Arthritis    feet   CKD (chronic kidney disease), stage III (HCC)    Coronary artery disease    Glaucoma    Hyperlipidemia    Hypertension    LVH (left ventricular hypertrophy)    Wears dentures    full upper and lower     History reviewed. No pertinent family history.   Social History:  reports that she quit smoking about 13 months ago. Her smoking use included cigarettes. She has never used smokeless tobacco. She reports previous alcohol use. She reports previous drug use.   Exam: Current vital signs: BP (!) 115/50 (BP Location: Right Arm)   Pulse (!) 58   Temp 98.4 F (36.9 C)   Resp 18   Ht 5\' 1"  (1.549  m)   Wt 50.3 kg   SpO2 93%   BMI 20.97 kg/m  Vital signs in last 24 hours: Temp:  [97.5 F (36.4 C)-98.7 F (37.1 C)] 98.4 F (36.9 C) (08/01 1148) Pulse Rate:  [52-93] 58 (08/01 1148) Resp:  [9-23] 18 (08/01 1148) BP: (105-173)/(50-96) 115/50 (08/01 1148) SpO2:  [89 %-98 %] 93 % (08/01 1148) Weight:  [50.3 kg] 50.3 kg (08/01 0426)   Physical Exam  Constitutional: Appears well-developed and well-nourished.  Psych: Affect appropriate to situation Eyes: No scleral injection HENT: No OP obstruction MSK: no joint deformities.  Cardiovascular: Normal rate and regular rhythm.  Respiratory: Effort normal, non-labored breathing GI: Soft.  No distension. There is no tenderness.  Skin: WDI  Neuro: Mental Status: Patient is awake, alert, oriented to person, place, month, year, and situation. Patient is able to give a clear and coherent history. No signs of aphasia or neglect Cranial Nerves: II: She has a dense left hemianopia. Pupils are equal, round, and reactive to light.   III,IV, VI: EOMI without ptosis or diploplia.  V: Facial sensation is symmetric to temperature VII: Facial movement is symmetric.  VIII: hearing is intact to voice X: Uvula elevates symmetrically XI: Shoulder shrug is symmetric. XII: tongue is midline without atrophy or fasciculations.  Motor: Tone is normal. Bulk is normal. 5/5  strength was present in all four extremities.  Sensory: Sensation is symmetric to light touch and temperature in the arms and legs. Cerebellar: No clear ataxia     I have reviewed labs in epic and the results pertinent to this consultation are: LDL 67  I have reviewed the images obtained: MRI-negative  Impression: 87 year old female who likely had a stroke about a year ago.  Given we are outside the 3-week window where dual antiplatelet therapy would be indicated, I would continue with aspirin monotherapy.  Echo reveals no clear embolic source.  Prolonged cardiac monitoring  could be considered as an outpatient, but given that this is not an acute event I do not feel that this is necessary at this time.  The patient is asking to go home, and I think this is reasonable.  I think that her headache yesterday was likely migrainous in nature.  Recommendations: 1) aspirin 81 mg daily 2) continue home statin as her LDL is at goal 3) she can follow-up with outpatient neurology as needed for headaches.   Ritta Slot, MD [facility] [phone]  If 7pm- 7am, please page neurology on call as listed in [facility].

## 2021-03-22 NOTE — Evaluation (Signed)
Physical Therapy Evaluation Patient Details Name: Hayley Maynard MRN: [id number] DOB: 04-Nov-1921 Today's Date: 03/22/2021   History of Present Illness  Pt is a 87 y/o F admitted on 03/21/21 with c/c of HA, N&V. MRI reveals no acute intracranial infarct or other abnormality. PMH: HTN, HLD, gout, anemia, recent lap cholecystectomy (~2 weeks ago), CKD stage 3, CAD, glaucoma, L ventricular hypertrophy  Clinical Impression  Pt seen for PT evaluation with daughter Agustin Cree) present for session. Pt completes bed mobility without assistance, sit>stand with min assist from daughter, and ambulates with supervision with cuing for turning but pt noting feeling uneasy in unfamiliar environment. 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 Pt & daughter both feel pt is at baseline level of function & does not need any further PT services. Will complete PT order. Please re-consult if new needs arise.     Follow Up Recommendations No PT follow up;Supervision/Assistance - 24 hour    Equipment Recommendations  None recommended by PT    Recommendations for Other Services       Precautions / Restrictions Precautions Precautions: Fall Restrictions Weight Bearing Restrictions: No      Mobility  Bed Mobility Overal bed mobility: Modified Independent             General bed mobility comments: supine>sit with HOB elevated & bed rails    Transfers Overall transfer level: Needs assistance Equipment used: Rolling walker (2 wheeled) Transfers: Sit to/from Stand Sit to Stand: Min assist         General transfer comment: prefers for daughter to provide assistance vs PT, requires min assist to power up to standing  Ambulation/Gait Ambulation/Gait assistance: Supervision Gait Distance (Feet): 75 Feet Assistive device: Rolling walker (2 wheeled) Gait Pattern/deviations: Decreased step length - right;Decreased step length -  left;Decreased stride length Gait velocity: decreased   General Gait Details: Max cuing for turning, directions in unknown environment  Stairs            Wheelchair Mobility    Modified Rankin (Stroke Patients Only)       Balance Overall balance assessment: Mild deficits observed, not formally tested                                           Pertinent Vitals/Pain Pain Assessment: No/denies pain    Home Living Family/patient expects to be discharged to:: Private residence Living Arrangements: Children Available Help at Discharge: Family;Available 24 hours/day Type of Home: House Home Access:  (chair lift at garage entrance)     Home Layout: Two level;Able to live on main level with bedroom/bathroom Home Equipment: Dan Humphreys - 2 wheels      Prior Function Level of Independence: Needs assistance   Gait / Transfers Assistance Needed: Household ambulation with RW, intermittent assistance for sit>stand  ADL's / Homemaking Assistance Needed: Toilets without assistance, daughter assists with bathing & dressing as she notes she doesn't like for pt to bend over.  Comments: Denies falls in the past 6 months.     Hand Dominance        Extremity/Trunk Assessment   Upper Extremity Assessment Upper Extremity Assessment: Overall WFL for tasks assessed    Lower Extremity Assessment Lower Extremity Assessment: Overall WFL for tasks assessed    Cervical / Trunk Assessment Cervical /  Trunk Assessment: Kyphotic  Communication   Communication: No difficulties  Cognition Arousal/Alertness: Awake/alert Behavior During Therapy: WFL for tasks assessed/performed Overall Cognitive Status: Within Functional Limits for tasks assessed                                 General Comments: Pleasant & oriented. Pt reports feeling "uneasy" ambulating in unknown environment.      General Comments      Exercises     Assessment/Plan    PT  Assessment Patent does not need any further PT services  PT Problem List         PT Treatment Interventions      PT Goals (Current goals can be found in the Care Plan section)  Acute Rehab PT Goals Patient Stated Goal: go home PT Goal Formulation: With patient/family Time For Goal Achievement: 04/05/21 Potential to Achieve Goals: Good    Frequency     Barriers to discharge        Co-evaluation               AM-PAC PT "6 Clicks" Mobility  Outcome Measure Help needed turning from your back to your side while in a flat bed without using bedrails?: A Little Help needed moving from lying on your back to sitting on the side of a flat bed without using bedrails?: A Little Help needed moving to and from a bed to a chair (including a wheelchair)?: A Little Help needed standing up from a chair using your arms (e.g., wheelchair or bedside chair)?: A Little Help needed to walk in hospital room?: A Little Help needed climbing 3-5 steps with a railing? : Total 6 Click Score: 16    End of Session   Activity Tolerance: Patient tolerated treatment well Patient left: in chair;with chair alarm set;with call bell/phone within reach;with family/visitor present Nurse Communication: Mobility status      Time: 6283-6629 PT Time Calculation (min) (ACUTE ONLY): 15 min   Charges:   PT Evaluation $PT Eval Moderate Complexity: 1 Mod          Aleda Grana, PT, DPT 03/22/21, 12:02 PM   Sandi Mariscal 03/22/2021, 12:00 PM

## 2021-03-22 NOTE — Progress Notes (Signed)
*  PRELIMINARY RESULTS* Echocardiogram 2D Echocardiogram has been performed.  Joanette Gula Manuel Lawhead 03/22/2021, 9:57 AM

## 2021-03-22 NOTE — Progress Notes (Signed)
Occupational Therapy Evaluation Patient Details Name: Hayley Maynard MRN: [id number] DOB: 1922-02-22 Today's Date: 03/22/2021    History of Present Illness Pt is a 87 y/o F admitted on 03/21/21 with c/c of HA, N&V. MRI reveals no acute intracranial infarct or other abnormality. PMH: HTN, HLD, gout, anemia, recent lap cholecystectomy (~2 weeks ago), CKD stage 3, CAD, glaucoma, L ventricular hypertrophy   Clinical Impression   Patient presenting with decreased I in self care, balance, functional mobility, strength, endurance, and vision changes.Patient reports living with her daughter who assists her with self care and IADL tasks PTA. Pt reports taking herself to bathroom via RW but needs assist for bathing and dressing ( LB). Pt with R eye blindness and L eye blurred vision at baseline. In room, pt under and over shoots during finger to nose test. She is unable to locate daughter in room sitting on her left without cuing for head turn. Patient currently functioning at min A. Patient will benefit from acute OT to increase overall independence in the areas of ADLs, functional mobility, and safety awareness in order to safely discharge home with family.    Follow Up Recommendations  Home health OT;Other (comment) (to address new L inattention/field cut)    Equipment Recommendations  None recommended by OT       Precautions / Restrictions Precautions Precautions: Fall Restrictions Weight Bearing Restrictions: No      Mobility Bed Mobility Overal bed mobility: Modified Independent             General bed mobility comments: seated in recliner chair    Transfers Overall transfer level: Needs assistance Equipment used: Rolling walker (2 wheeled) Transfers: Sit to/from Stand Sit to Stand: Min assist         General transfer comment: prefers for daughter to provide assistance vs PT, requires min assist to power up to standing    Balance Overall balance assessment: Mild deficits  observed, not formally tested                                         ADL either performed or assessed with clinical judgement   ADL Overall ADL's : Needs assistance/impaired                                       General ADL Comments: Pt is likely close to baseline for self care tasks at this time but is needing increased cuing to locate and visually scan items to the L     Vision Baseline Vision/History:  (R eye blindness and blurred L eye vision at baseline.) Patient Visual Report: Blurring of vision Vision Assessment?: Vision impaired- to be further tested in functional context Additional Comments: Pt with vision deficits at baseline with R eye blindness and L eye blurred vision but L eye vision worse since CVA per pt report. Pt with limited peripheral vision when tested. Pt unable to locate items on L without cuing to turn head. She was able to read some items on dry erase board with it directly in front of her. She denies diplopia.            Pertinent Vitals/Pain Pain Assessment: No/denies pain     Hand Dominance Right   Extremity/Trunk Assessment Upper Extremity Assessment Upper Extremity Assessment: Overall WFL for tasks  assessed   Lower Extremity Assessment Lower Extremity Assessment: Overall WFL for tasks assessed   Cervical / Trunk Assessment Cervical / Trunk Assessment: Kyphotic   Communication Communication Communication: No difficulties   Cognition Arousal/Alertness: Awake/alert Behavior During Therapy: WFL for tasks assessed/performed Overall Cognitive Status: Within Functional Limits for tasks assessed                                 General Comments: Pt is very cooperative              Home Living Family/patient expects to be discharged to:: Private residence Living Arrangements: Children Available Help at Discharge: Family;Available 24 hours/day Type of Home: House Home Access: Other (comment)  (chair lift at garage)     Home Layout: Two level;Able to live on main level with bedroom/bathroom         Bathroom Toilet: Handicapped height     Home Equipment: Walker - 2 wheels;Toilet riser          Prior Functioning/Environment Level of Independence: Needs assistance  Gait / Transfers Assistance Needed: Household ambulation with RW, intermittent assistance for sit>stand ADL's / Homemaking Assistance Needed: Toilets without assistance, daughter assists with bathing & dressing as she notes she doesn't like for pt to bend over. Sink baths.   Comments: Denies falls in the past 6 months.        OT Problem List: Decreased activity tolerance;Decreased safety awareness;Impaired balance (sitting and/or standing);Impaired vision/perception      OT Treatment/Interventions: Self-care/ADL training;Modalities;Balance training;Therapeutic exercise;Therapeutic activities;Energy conservation;Visual/perceptual remediation/compensation;Manual therapy;Patient/family education    OT Goals(Current goals can be found in the care plan section) Acute Rehab OT Goals Patient Stated Goal: go home OT Goal Formulation: With patient/family Time For Goal Achievement: 04/05/21 Potential to Achieve Goals: Good ADL Goals Pt Will Perform Grooming: with supervision;standing Pt Will Transfer to Toilet: with supervision;ambulating Pt Will Perform Toileting - Clothing Manipulation and hygiene: with supervision;sit to/from stand Additional ADL Goal #1: (P) Pt will visually scan to the L to obtain self care items with min cuing for scanning technique.  OT Frequency: Min 2X/week   Barriers to D/C:    none known at this time          AM-PAC OT "6 Clicks" Daily Activity     Outcome Measure Help from another person eating meals?: None Help from another person taking care of personal grooming?: A Little Help from another person toileting, which includes using toliet, bedpan, or urinal?: A Little Help  from another person bathing (including washing, rinsing, drying)?: A Little Help from another person to put on and taking off regular upper body clothing?: A Little Help from another person to put on and taking off regular lower body clothing?: A Little 6 Click Score: 19   End of Session Nurse Communication: Mobility status;Precautions;Other (comment) (visual deficits)  Activity Tolerance: Patient tolerated treatment well Patient left: in chair;with call bell/phone within reach;with chair alarm set;with family/visitor present  OT Visit Diagnosis: Unsteadiness on feet (R26.81);Low vision, both eyes (H54.2)                Time: 9163-8466 OT Time Calculation (min): 19 min Charges:  OT General Charges $OT Visit: 1 Visit OT Evaluation $OT Eval Low Complexity: 1 Low OT Treatments $Therapeutic Activity: 8-22 mins  Jackquline Denmark, MS, OTR/L , CBIS ascom [phone]  03/22/21, 12:50 PM  03/22/2021, 12:49 PM

## 2021-03-22 NOTE — Progress Notes (Signed)
PHARMACIST - PHYSICIAN COMMUNICATION  CONCERNING:  Enoxaparin (Lovenox) for DVT Prophylaxis    RECOMMENDATION: Patient was prescribed enoxaprin 40mg  q24 hours for VTE prophylaxis.   Filed Weights   03/22/21 0426  Weight: 50.3 kg (111 lb)    Body mass index is 20.97 kg/m.  Estimated Creatinine Clearance: 18.7 mL/min (A) (by C-G formula based on SCr of 1.3 mg/dL (H)).  Patient is candidate for enoxaparin 30mg  every 24 hours based on CrCl <44ml/min or Weight <45kg  DESCRIPTION: Pharmacy has adjusted enoxaparin dose per [facility] policy.  Patient is now receiving enoxaparin 30 mg every 24 hours   31m, PharmD, [facility] 03/22/2021 4:43 AM

## 2021-03-22 NOTE — Progress Notes (Signed)
SLP Cancellation Note  Patient Details Name: Hayley Maynard MRN: [id number] DOB: 20-Feb-1922   Cancelled treatment:       Reason Eval/Treat Not Completed: SLP screened, no needs identified, will sign off  Anaaya Fuster B. Dreama Saa M.S., CCC-SLP, [facility] Speech-Language Pathologist Rehabilitation Services Office [phone]   Reuel Derby 03/22/2021, 3:38 PM

## 2021-03-22 NOTE — Plan of Care (Signed)
  Problem: Nutrition: Goal: Adequate nutrition will be maintained Outcome: Progressing   

## 2021-03-22 NOTE — Discharge Summary (Signed)
Physician Discharge Summary  Hayley Maynard ZOX:[id number] DOB: 1922/03/16 DOA: 03/21/2021  PCP: Jerl Mina, MD  Admit date: 03/21/2021 Discharge date: 03/22/2021  Admitted From: Home Disposition: Home  Recommendations for Outpatient Follow-up:  Follow up with PCP in 1-2 weeks Please obtain BMP/CBC in one week Please follow up on the following pending results: None  Home Health: No Equipment/Devices: Rolling walker Discharge Condition: Stable CODE STATUS: Full Diet recommendation: Heart Healthy   Brief/Interim Summary:  Hayley Maynard is a 87 y.o. female with history of hypertension, hyperlipidemia, gout, chronic and disease stage III, anemia who had a recent lap cholecystectomy about 2 weeks ago started experiencing headache around the right eye with nausea vomiting since he woke up this morning around 7 AM.  Denies any weakness of the extremities.  Patient has blindness in the right eye but also had some poor vision on the left eye since this morning.  Had gone to the urgent care and was referred to the ER.  In the ER CT head shows possible subacute infarct of the right occipital area.  Patient is moving all extremities 5 x 5.  Has poor vision on the left eye and is blind on the right eye.  Patient is being admitted for further work-up of stroke.  COVID PCR was negative.  MRI brain did not show any acute changes.  MRA was negative for any large vessel occlusions.  She completed the stroke work-up.  Neurology was also consulted.  Patient appears to be at her baseline and wants to go home.  She was discharged home with addition of low-dose aspirin.  She will continue with rest of her home medications and follow-up with her providers.  Rest of the work-up appears to be at her baseline.  Discharge Diagnoses:  Principal Problem:   Acute CVA (cerebrovascular accident) [facility]) Active Problems:   Benign essential hypertension   CKD (chronic kidney disease) stage 3, GFR 30-59 ml/min   Mixed  hyperlipidemia   Discharge Instructions  Discharge Instructions     Diet - low sodium heart healthy   Complete by: As directed    Discharge instructions   Complete by: As directed    It was pleasure taking care of you. Of her neurologist suggested to add baby aspirin to your current medications. Continue taking rest of your medications and follow-up with your doctor.   Increase activity slowly   Complete by: As directed    No wound care   Complete by: As directed       Allergies as of 03/22/2021       Reactions   Propoxyphene Other (See Comments)   Telmisartan-hctz Other (See Comments)        Medication List     TAKE these medications    acetaminophen 325 MG tablet Commonly known as: Tylenol Take 2 tablets (650 mg total) by mouth every 8 (eight) hours as needed for mild pain.   amLODipine 10 MG tablet Commonly known as: NORVASC Take 10 mg by mouth daily.   aspirin EC 81 MG tablet Take 1 tablet (81 mg total) by mouth daily. Swallow whole.   brimonidine 0.2 % ophthalmic solution Commonly known as: ALPHAGAN Place 1 drop into both eyes in the morning and at bedtime.   dorzolamide-timolol 22.3-6.8 MG/ML ophthalmic solution Commonly known as: COSOPT Place 1 drop into both eyes 2 (two) times daily.   febuxostat 40 MG tablet Commonly known as: ULORIC Take 40 mg by mouth daily.   ferrous fumarate-b12-vitamic C-folic  acid capsule Commonly known as: TRINSICON / FOLTRIN Take 1 capsule by mouth 2 (two) times daily after a meal.   furosemide 20 MG tablet Commonly known as: LASIX TAKE 1 TABLET(20 MG) BY MOUTH EVERY DAY FOR 1 TO 3 DAYS AS NEEDED FOR SWELLING   hydrALAZINE 50 MG tablet Commonly known as: APRESOLINE Take 1 tablet by mouth 2 (two) times daily.   latanoprost 0.005 % ophthalmic solution Commonly known as: XALATAN Place 1 drop into both eyes at bedtime.   losartan 50 MG tablet Commonly known as: COZAAR Take 50 mg by mouth daily.   ondansetron 4  MG tablet Commonly known as: Zofran Take 1 tablet (4 mg total) by mouth every 8 (eight) hours as needed for up to 10 doses for nausea or vomiting.   simvastatin 20 MG tablet Commonly known as: ZOCOR Take 20 mg by mouth daily.   vitamin B-12 1000 MCG tablet Commonly known as: CYANOCOBALAMIN Take 1,000 mcg by mouth daily.        Follow-up Information     Jerl Mina, MD. Schedule an appointment as soon as possible for a visit in 1 week(s).   Specialty: Family Medicine Contact information: [address] [facility] [place] [address] [phone]                Allergies  Allergen Reactions   Propoxyphene Other (See Comments)   Telmisartan-Hctz Other (See Comments)    Consultations: Neurology  Procedures/Studies: CT Head Wo Contrast  Result Date: 03/21/2021 CLINICAL DATA:  headache this morning, took [facility] powder without relief. disoriented today to the daughter who lives with her. Nausea, vomiting x 2 EXAM: CT HEAD WITHOUT CONTRAST TECHNIQUE: Contiguous axial images were obtained from the base of the skull through the vertex without intravenous contrast. COMPARISON:  CT head 03/19/2020 FINDINGS: Brain: Cerebral ventricle sizes are concordant with the degree of cerebral volume loss. Patchy and confluent areas of decreased attenuation are noted throughout the deep and periventricular white matter of the cerebral hemispheres bilaterally, compatible with chronic microvascular ischemic disease. Interval development of right occipital loss of gray-white matter differentiation. Question mild associated edema. No parenchymal hemorrhage. No mass lesion. No extra-axial collection. No mass effect or midline shift. No hydrocephalus. Basilar cisterns are patent. Vascular: No hyperdense vessel. Atherosclerotic calcifications are present within the cavernous internal carotid and vertebral arteries. Skull: No acute fracture or focal lesion. Sinuses/Orbits: Paranasal sinuses and  mastoid air cells are clear. Bilateral lens replacement. Otherwise the orbits are unremarkable. Other: None. IMPRESSION: 1. Age-indeterminate, likely subacute, right occipital infarction. 2. No acute intracranial hemorrhage. These results were called by telephone at the time of interpretation on 03/21/2021 at 9:50 pm to provider [facility] , who verbally acknowledged these results. Electronically Signed   By: Tish Frederickson M.D.   On: 03/21/2021 21:52   MR ANGIO HEAD WO CONTRAST  Result Date: 03/22/2021 CLINICAL DATA:  Initial evaluation for neuro deficit, stroke suspected. EXAM: MRI HEAD WITHOUT CONTRAST MRA HEAD WITHOUT CONTRAST TECHNIQUE: Multiplanar, multi-echo pulse sequences of the brain and surrounding structures were acquired without intravenous contrast. Angiographic images of the Circle of Willis were acquired using MRA technique without intravenous contrast. COMPARISON:  Prior CT from 03/21/2021. FINDINGS: MRI HEAD FINDINGS Brain: Diffuse prominence of the CSF containing spaces compatible with generalized age-related cerebral atrophy. Patchy and confluent T2/FLAIR hyperintensity within the periventricular and deep white matter both cerebral hemispheres as well as the pons, most consistent with chronic small vessel ischemic disease, moderate in  nature. Encephalomalacia and gliosis involving the right occipital lobe consistent with a chronic right PCA distribution infarct, corresponding with abnormality on prior CT. Associated mild susceptibility artifact could be related to chronic hemosiderin staining and/or laminar necrosis. No abnormal foci of restricted diffusion to suggest acute or subacute ischemia. Gray-white matter differentiation otherwise maintained. No other areas of encephalomalacia to suggest chronic cortical infarction. Single punctate chronic microhemorrhage noted within the right cerebellum, of doubtful significance in isolation. No acute intracranial hemorrhage. No mass lesion, midline  shift or mass effect. No hydrocephalus or extra-axial fluid collection. Pituitary gland suprasellar region within normal limits. Midline structures intact. Vascular: Major intracranial vascular flow voids are maintained. Skull and upper cervical spine: Craniocervical junction within normal limits. Bone marrow signal intensity within normal limits. No scalp soft tissue abnormality. Sinuses/Orbits: Patient status post bilateral ocular lens replacement. Globes and orbital soft tissues demonstrate no acute finding. Mild scattered mucosal thickening noted within the ethmoidal air cells. Paranasal sinuses are otherwise clear. No mastoid effusion. Inner ear structures grossly normal. Other: None. MRA HEAD FINDINGS Anterior circulation: Examination moderately degraded by motion artifact. Visualized distal cervical segments of the internal carotid arteries are patent with antegrade flow. Petrous segments patent bilaterally. Cavernous/supraclinoid segments are diffusely ectatic and tortuous with diffuse atheromatous irregularity, but no definite hemodynamically significant stenosis on this motion degraded exam. A1 segments patent bilaterally. Normal anterior communicating artery complex. Anterior cerebral arteries irregular but patent to their distal aspects without stenosis. No significant M1 stenosis. Negative MCA bifurcations. Distal MCA branches perfused and fairly symmetric. Posterior circulation: Visualized vertebral arteries are markedly tortuous within the neck, particularly on the right. Vertebral arteries are patent to the vertebrobasilar junction without appreciable stenosis. Neither PICA well visualized on this motion degraded exam. Basilar irregular but patent to its distal aspect without high-grade stenosis. Superior cerebral arteries patent bilaterally. Both PCAs primarily supplied via the basilar and are grossly perfused to their distal aspects without appreciable stenosis. Anatomic variants: None significant.   No visible aneurysm. IMPRESSION: MRI HEAD IMPRESSION: 1. No acute intracranial infarct or other abnormality. 2. Chronic right PCA distribution infarct, accounting for the abnormality on prior CT. 3. Underlying age-related cerebral atrophy with moderate chronic microvascular ischemic disease. MRA HEAD IMPRESSION: 1. Motion degraded exam. 2. Negative MRA for large vessel occlusion. 3. Diffuse tortuosity with moderate atheromatous irregularity throughout the intracranial circulation, but no definite proximal high-grade or correctable stenosis. Electronically Signed   By: Rise Mu M.D.   On: 03/22/2021 01:33   MR BRAIN WO CONTRAST  Result Date: 03/22/2021 CLINICAL DATA:  Initial evaluation for neuro deficit, stroke suspected. EXAM: MRI HEAD WITHOUT CONTRAST MRA HEAD WITHOUT CONTRAST TECHNIQUE: Multiplanar, multi-echo pulse sequences of the brain and surrounding structures were acquired without intravenous contrast. Angiographic images of the Circle of Willis were acquired using MRA technique without intravenous contrast. COMPARISON:  Prior CT from 03/21/2021. FINDINGS: MRI HEAD FINDINGS Brain: Diffuse prominence of the CSF containing spaces compatible with generalized age-related cerebral atrophy. Patchy and confluent T2/FLAIR hyperintensity within the periventricular and deep white matter both cerebral hemispheres as well as the pons, most consistent with chronic small vessel ischemic disease, moderate in nature. Encephalomalacia and gliosis involving the right occipital lobe consistent with a chronic right PCA distribution infarct, corresponding with abnormality on prior CT. Associated mild susceptibility artifact could be related to chronic hemosiderin staining and/or laminar necrosis. No abnormal foci of restricted diffusion to suggest acute or subacute ischemia. Gray-white matter differentiation otherwise maintained. No other areas of encephalomalacia to  suggest chronic cortical infarction. Single  punctate chronic microhemorrhage noted within the right cerebellum, of doubtful significance in isolation. No acute intracranial hemorrhage. No mass lesion, midline shift or mass effect. No hydrocephalus or extra-axial fluid collection. Pituitary gland suprasellar region within normal limits. Midline structures intact. Vascular: Major intracranial vascular flow voids are maintained. Skull and upper cervical spine: Craniocervical junction within normal limits. Bone marrow signal intensity within normal limits. No scalp soft tissue abnormality. Sinuses/Orbits: Patient status post bilateral ocular lens replacement. Globes and orbital soft tissues demonstrate no acute finding. Mild scattered mucosal thickening noted within the ethmoidal air cells. Paranasal sinuses are otherwise clear. No mastoid effusion. Inner ear structures grossly normal. Other: None. MRA HEAD FINDINGS Anterior circulation: Examination moderately degraded by motion artifact. Visualized distal cervical segments of the internal carotid arteries are patent with antegrade flow. Petrous segments patent bilaterally. Cavernous/supraclinoid segments are diffusely ectatic and tortuous with diffuse atheromatous irregularity, but no definite hemodynamically significant stenosis on this motion degraded exam. A1 segments patent bilaterally. Normal anterior communicating artery complex. Anterior cerebral arteries irregular but patent to their distal aspects without stenosis. No significant M1 stenosis. Negative MCA bifurcations. Distal MCA branches perfused and fairly symmetric. Posterior circulation: Visualized vertebral arteries are markedly tortuous within the neck, particularly on the right. Vertebral arteries are patent to the vertebrobasilar junction without appreciable stenosis. Neither PICA well visualized on this motion degraded exam. Basilar irregular but patent to its distal aspect without high-grade stenosis. Superior cerebral arteries patent  bilaterally. Both PCAs primarily supplied via the basilar and are grossly perfused to their distal aspects without appreciable stenosis. Anatomic variants: None significant.  No visible aneurysm. IMPRESSION: MRI HEAD IMPRESSION: 1. No acute intracranial infarct or other abnormality. 2. Chronic right PCA distribution infarct, accounting for the abnormality on prior CT. 3. Underlying age-related cerebral atrophy with moderate chronic microvascular ischemic disease. MRA HEAD IMPRESSION: 1. Motion degraded exam. 2. Negative MRA for large vessel occlusion. 3. Diffuse tortuosity with moderate atheromatous irregularity throughout the intracranial circulation, but no definite proximal high-grade or correctable stenosis. Electronically Signed   By: Rise Mu M.D.   On: 03/22/2021 01:33   US Carotid Bilateral (at [facility] and [facility] only)  Result Date: 03/22/2021 CLINICAL DATA:  Initial evaluation for neuro deficit, stroke suspected. EXAM: BILATERAL CAROTID DUPLEX ULTRASOUND TECHNIQUE: Wallace Cullens scale imaging, color Doppler and duplex ultrasound were performed of bilateral carotid and vertebral arteries in the neck. COMPARISON:  Prior ultrasound from 08/02/2019. FINDINGS: Criteria: Quantification of carotid stenosis is based on velocity parameters that correlate the residual internal carotid diameter with NASCET-based stenosis levels, using the diameter of the distal internal carotid lumen as the denominator for stenosis measurement. The following velocity measurements were obtained: RIGHT ICA: 96/19 cm/sec CCA: 59/13 cm/sec SYSTOLIC ICA/CCA RATIO:  1.6 ECA: 121 cm/sec LEFT ICA: 131/34 cm/sec CCA: 51/13 cm/sec SYSTOLIC ICA/CCA RATIO:  2.6 ECA: 82 cm/sec RIGHT CAROTID ARTERY: Scattered intimal thickening with echogenic plaque within the visualized right CCA. Moderate to severe echogenic plaque about the right carotid bulb extending into the proximal right ICA. Measured velocity at 96/19 is favored to be underestimated given  the position of the measurement caliber. Stenosis is suspected to be at least 50-69% based on appearance as well as findings on prior ultrasound. Stenosis estimated at less than 50% by ultrasound criteria. Probable severe stenosis at the origin of the right external carotid artery noted. Visualized right ICA patent distally. RIGHT VERTEBRAL ARTERY:  Antegrade. LEFT CAROTID ARTERY: Scattered intimal thickening with echogenic plaque  within the visualized left CCA. Moderate to severe heterogeneous plaque seen about the left carotid bulb extending into the proximal left ICA. Velocity elevation to 131/34 with turbulent flow. Left ICA stenosis estimated at 50-69% by ultrasound criteria. Visualized left ICA patent distally. LEFT VERTEBRAL ARTERY:  Antegrade. IMPRESSION: 1. Moderate to advanced echogenic plaque about the carotid bulbs/proximal ICAs, with estimated stenoses at 50-69% bilaterally by ultrasound criteria. 2. Patent antegrade vertebral artery flow bilaterally. Electronically Signed   By: Rise Mu M.D.   On: 03/22/2021 00:22   ECHOCARDIOGRAM COMPLETE  Result Date: 03/22/2021    ECHOCARDIOGRAM REPORT   Patient Name:   Hayley Maynard Date of Exam: 03/22/2021 Medical Rec #:  [id number]   Height:       61.0 in Accession #:    [id number]  Weight:       111.0 lb Date of Birth:  1922/08/12   BSA:          1.471 m Patient Age:    87 years    BP:           105/50 mmHg Patient Gender: F           HR:           62 bpm. Exam Location:  [facility] Procedure: 2D Echo, Color Doppler and Cardiac Doppler Indications:     I63.9 Stroke  History:         Patient has no prior history of Echocardiogram examinations.                  CAD, Prior CABG, CKD; Risk Factors:Hypertension and                  Dyslipidemia.  Sonographer:     Humphrey Rolls RDCS (AE) Referring Phys:  [id number] Meryle Ready [facility] Diagnosing Phys: Marcina Millard MD  Sonographer Comments: Suboptimal apical window and suboptimal subcostal window. IMPRESSIONS  1.  Left ventricular ejection fraction, by estimation, is 50 to 55%. The left ventricle has low normal function. The left ventricle has no regional wall motion abnormalities. Left ventricular diastolic parameters were normal.  2. Right ventricular systolic function is normal. The right ventricular size is normal.  3. Left atrial size was mildly dilated.  4. Right atrial size was mildly dilated.  5. The mitral valve is normal in structure. Mild mitral valve regurgitation. No evidence of mitral stenosis.  6. Tricuspid valve regurgitation is mild to moderate.  7. The aortic valve is calcified. Aortic valve regurgitation is trivial. Mild to moderate aortic valve stenosis.  8. The inferior vena cava is normal in size with greater than 50% respiratory variability, suggesting right atrial pressure of 3 mmHg. FINDINGS  Left Ventricle: Left ventricular ejection fraction, by estimation, is 50 to 55%. The left ventricle has low normal function. The left ventricle has no regional wall motion abnormalities. The left ventricular internal cavity size was normal in size. There is no left ventricular hypertrophy. Left ventricular diastolic parameters were normal. Right Ventricle: The right ventricular size is normal. No increase in right ventricular wall thickness. Right ventricular systolic function is normal. Left Atrium: Left atrial size was mildly dilated. Right Atrium: Right atrial size was mildly dilated. Pericardium: There is no evidence of pericardial effusion. Mitral Valve: The mitral valve is normal in structure. Mild mitral valve regurgitation. No evidence of mitral valve stenosis. MV peak gradient, 6.6 mmHg. The mean mitral valve gradient is 3.0 mmHg. Tricuspid Valve: The tricuspid valve is normal in structure. Tricuspid  valve regurgitation is mild to moderate. No evidence of tricuspid stenosis. Aortic Valve: The aortic valve is calcified. Aortic valve regurgitation is trivial. Mild to moderate aortic stenosis is present.  Aortic valve mean gradient measures 12.0 mmHg. Aortic valve peak gradient measures 20.6 mmHg. Aortic valve area, by VTI measures 1.43 cm. Pulmonic Valve: The pulmonic valve was normal in structure. Pulmonic valve regurgitation is not visualized. No evidence of pulmonic stenosis. Aorta: The aortic root is normal in size and structure. Venous: The inferior vena cava is normal in size with greater than 50% respiratory variability, suggesting right atrial pressure of 3 mmHg. IAS/Shunts: No atrial level shunt detected by color flow Doppler.  LEFT VENTRICLE PLAX 2D LVIDd:         3.20 cm  Diastology LVIDs:         2.40 cm  LV e' medial:    4.35 cm/s LV PW:         1.20 cm  LV E/e' medial:  23.4 LV IVS:        1.40 cm  LV e' lateral:   5.87 cm/s LVOT diam:     2.10 cm  LV E/e' lateral: 17.4 LV SV:         76 LV SV Index:   51 LVOT Area:     3.46 cm  RIGHT VENTRICLE RV Basal diam:  3.50 cm LEFT ATRIUM             Index       RIGHT ATRIUM           Index LA diam:        3.50 cm 2.38 cm/m  RA Area:     14.40 cm LA Vol (A2C):   33.2 ml 22.58 ml/m RA Volume:   34.70 ml  23.60 ml/m LA Vol (A4C):   56.0 ml 38.08 ml/m LA Biplane Vol: 44.1 ml 29.99 ml/m  AORTIC VALVE                    PULMONIC VALVE AV Area (Vmax):    1.68 cm     PV Vmax:       1.10 m/s AV Area (Vmean):   1.55 cm     PV Vmean:      79.700 cm/s AV Area (VTI):     1.43 cm     PV VTI:        0.222 m AV Vmax:           227.00 cm/s  PV Peak grad:  4.8 mmHg AV Vmean:          168.000 cm/s PV Mean grad:  3.0 mmHg AV VTI:            0.527 m AV Peak Grad:      20.6 mmHg AV Mean Grad:      12.0 mmHg LVOT Vmax:         110.00 cm/s LVOT Vmean:        75.000 cm/s LVOT VTI:          0.218 m LVOT/AV VTI ratio: 0.41  AORTA Ao Root diam: 2.80 cm MITRAL VALVE                TRICUSPID VALVE MV Area (PHT): 3.97 cm     TR Peak grad:   29.4 mmHg MV Area VTI:   2.24 cm     TR Vmax:        271.00 cm/s MV Peak grad:  6.6 mmHg MV Mean grad:  3.0 mmHg     SHUNTS MV Vmax:        1.28 m/s     Systemic VTI:  0.22 m MV Vmean:      73.8 cm/s    Systemic Diam: 2.10 cm MV Decel Time: 191 msec MV E velocity: 102.00 cm/s MV A velocity: 96.40 cm/s MV E/A ratio:  1.06 Marcina MillardAlexander Paraschos MD Electronically signed by Marcina MillardAlexander Paraschos MD Signature Date/Time: 03/22/2021/1:53:10 PM    Final    US ABDOMEN LIMITED RUQ (LIVER/GB)  Result Date: 03/01/2021 CLINICAL DATA:  Right upper quadrant pain for 2 weeks. EXAM: ULTRASOUND ABDOMEN LIMITED RIGHT UPPER QUADRANT COMPARISON:  None. FINDINGS: Gallbladder: Large shadowing echogenic stone measures 3.6 cm. Associated wall thickening, 4 mm, and possible trace pericholecystic fluid. Negative sonographic Murphy sign. Common bile duct: Diameter: 5 mm, within normal limits. Liver: No focal lesion identified. Within normal limits in parenchymal echogenicity. Portal vein is patent on color Doppler imaging with normal direction of blood flow towards the liver. Other: Incidental imaging of the right kidney shows increased parenchymal echogenicity. IMPRESSION: 1. Cholelithiasis with wall thickening and trace pericholecystic fluid. Findings are indicative of chronic cholecystitis in the presence of a negative sonographic Murphy sign. 2. Increased parenchymal echogenicity the right kidney can be seen with chronic medical renal disease. Electronically Signed   By: Leanna BattlesMelinda  Blietz M.D.   On: 03/01/2021 14:31    Subjective: Patient was seen and examined today.  Pleasant elderly lady.  Denies any complaints.  Wants to go home.  Daughter at bedside.  Discharge Exam: Vitals:   03/22/21 0755 03/22/21 1148  BP: (!) 105/50 (!) 115/50  Pulse: (!) 52 (!) 58  Resp: 16 18  Temp: 98.7 F (37.1 C) 98.4 F (36.9 C)  SpO2: 93% 93%   Vitals:   03/22/21 0459 03/22/21 0600 03/22/21 0755 03/22/21 1148  BP: 109/60 126/63 (!) 105/50 (!) 115/50  Pulse: 63 (!) 55 (!) 52 (!) 58  Resp: 18  16 18   Temp: 97.9 F (36.6 C) 98.5 F (36.9 C) 98.7 F (37.1 C) 98.4 F (36.9 C)   TempSrc: Oral Oral    SpO2: 94% 98% 93% 93%  Weight:      Height:        General: Pt is alert, awake, not in acute distress Cardiovascular: RRR, S1/S2 +, no rubs, no gallops Respiratory: CTA bilaterally, no wheezing, no rhonchi Abdominal: Soft, NT, ND, bowel sounds + Extremities: no edema, no cyanosis   The results of significant diagnostics from this hospitalization (including imaging, microbiology, ancillary and laboratory) are listed below for reference.    Microbiology: Recent Results (from the past 240 hour(s))  Resp Panel by RT-PCR (Flu A&B, Covid) Nasopharyngeal Swab     Status: None   Collection Time: 03/21/21 11:03 PM   Specimen: Nasopharyngeal Swab; Nasopharyngeal(NP) swabs in vial transport medium  Result Value Ref Range Status   SARS Coronavirus 2 by RT PCR NEGATIVE NEGATIVE Final    Comment: (NOTE) SARS-CoV-2 target nucleic acids are NOT DETECTED.  The SARS-CoV-2 RNA is generally detectable in upper respiratory specimens during the acute phase of infection. The lowest concentration of SARS-CoV-2 viral copies this assay can detect is 138 copies/mL. A negative result does not preclude SARS-Cov-2 infection and should not be used as the sole basis for treatment or other patient management decisions. A negative result may occur with  improper specimen collection/handling, submission of specimen other than nasopharyngeal swab, presence of viral mutation(s) within  the areas targeted by this assay, and inadequate number of viral copies(<138 copies/mL). A negative result must be combined with clinical observations, patient history, and epidemiological information. The expected result is Negative.  Fact Sheet for Patients:  BloggerCourse.com  Fact Sheet for Healthcare Providers:  SeriousBroker.it  This test is no t yet approved or cleared by the [place] FDA and  has been authorized for detection and/or diagnosis  of SARS-CoV-2 by FDA under an Emergency Use Authorization (EUA). This EUA will remain  in effect (meaning this test can be used) for the duration of the COVID-19 declaration under Section 564(b)(1) of the Act, 21 U.S.C.section 360bbb-3(b)(1), unless the authorization is terminated  or revoked sooner.       Influenza A by PCR NEGATIVE NEGATIVE Final   Influenza B by PCR NEGATIVE NEGATIVE Final    Comment: (NOTE) The Xpert Xpress SARS-CoV-2/FLU/RSV plus assay is intended as an aid in the diagnosis of influenza from Nasopharyngeal swab specimens and should not be used as a sole basis for treatment. Nasal washings and aspirates are unacceptable for Xpert Xpress SARS-CoV-2/FLU/RSV testing.  Fact Sheet for Patients: BloggerCourse.com  Fact Sheet for Healthcare Providers: SeriousBroker.it  This test is not yet approved or cleared by the [place] FDA and has been authorized for detection and/or diagnosis of SARS-CoV-2 by FDA under an Emergency Use Authorization (EUA). This EUA will remain in effect (meaning this test can be used) for the duration of the COVID-19 declaration under Section 564(b)(1) of the Act, 21 U.S.C. section 360bbb-3(b)(1), unless the authorization is terminated or revoked.  Performed at [facility], [address] [place], [place] [address]      Labs: BNP (last 3 results) No results for input(s): BNP in the last 8760 hours. Basic Metabolic Panel: Recent Labs  Lab 03/21/21 2033  NA 139  K 4.9  CL 104  CO2 30  GLUCOSE 128*  BUN 15  CREATININE 1.30*  CALCIUM 9.3   Liver Function Tests: No results for input(s): AST, ALT, ALKPHOS, BILITOT, PROT, ALBUMIN in the last 168 hours. No results for input(s): LIPASE, AMYLASE in the last 168 hours. No results for input(s): AMMONIA in the last 168 hours. CBC: Recent Labs  Lab 03/21/21 2033  WBC 5.2  HGB 11.0*  HCT 35.5*  MCV 94.9  PLT 215    Cardiac Enzymes: No results for input(s): CKTOTAL, CKMB, CKMBINDEX, TROPONINI in the last 168 hours. BNP: Invalid input(s): POCBNP CBG: No results for input(s): GLUCAP in the last 168 hours. D-Dimer No results for input(s): DDIMER in the last 72 hours. Hgb A1c Recent Labs    03/22/21 0432  HGBA1C 6.2*   Lipid Profile Recent Labs    03/22/21 0432  CHOL 150  HDL 75  LDLCALC 67  TRIG 39  CHOLHDL 2.0   Thyroid function studies No results for input(s): TSH, T4TOTAL, T3FREE, THYROIDAB in the last 72 hours.  Invalid input(s): FREET3 Anemia work up No results for input(s): VITAMINB12, FOLATE, FERRITIN, TIBC, IRON, RETICCTPCT in the last 72 hours. Urinalysis    Component Value Date/Time   COLORURINE YELLOW (A) 03/22/2021 1340   APPEARANCEUR HAZY (A) 03/22/2021 1340   LABSPEC 1.018 03/22/2021 1340   PHURINE 5.0 03/22/2021 1340   GLUCOSEU NEGATIVE 03/22/2021 1340   HGBUR NEGATIVE 03/22/2021 1340   BILIRUBINUR NEGATIVE 03/22/2021 1340   KETONESUR NEGATIVE 03/22/2021 1340   PROTEINUR 30 (A) 03/22/2021 1340   NITRITE NEGATIVE 03/22/2021 1340   LEUKOCYTESUR LARGE (A) 03/22/2021 1340  Sepsis Labs Invalid input(s): PROCALCITONIN,  WBC,  LACTICIDVEN Microbiology Recent Results (from the past 240 hour(s))  Resp Panel by RT-PCR (Flu A&B, Covid) Nasopharyngeal Swab     Status: None   Collection Time: 03/21/21 11:03 PM   Specimen: Nasopharyngeal Swab; Nasopharyngeal(NP) swabs in vial transport medium  Result Value Ref Range Status   SARS Coronavirus 2 by RT PCR NEGATIVE NEGATIVE Final    Comment: (NOTE) SARS-CoV-2 target nucleic acids are NOT DETECTED.  The SARS-CoV-2 RNA is generally detectable in upper respiratory specimens during the acute phase of infection. The lowest concentration of SARS-CoV-2 viral copies this assay can detect is 138 copies/mL. A negative result does not preclude SARS-Cov-2 infection and should not be used as the sole basis for treatment or other  patient management decisions. A negative result may occur with  improper specimen collection/handling, submission of specimen other than nasopharyngeal swab, presence of viral mutation(s) within the areas targeted by this assay, and inadequate number of viral copies(<138 copies/mL). A negative result must be combined with clinical observations, patient history, and epidemiological information. The expected result is Negative.  Fact Sheet for Patients:  BloggerCourse.com  Fact Sheet for Healthcare Providers:  SeriousBroker.it  This test is no t yet approved or cleared by the [place] FDA and  has been authorized for detection and/or diagnosis of SARS-CoV-2 by FDA under an Emergency Use Authorization (EUA). This EUA will remain  in effect (meaning this test can be used) for the duration of the COVID-19 declaration under Section 564(b)(1) of the Act, 21 U.S.C.section 360bbb-3(b)(1), unless the authorization is terminated  or revoked sooner.       Influenza A by PCR NEGATIVE NEGATIVE Final   Influenza B by PCR NEGATIVE NEGATIVE Final    Comment: (NOTE) The Xpert Xpress SARS-CoV-2/FLU/RSV plus assay is intended as an aid in the diagnosis of influenza from Nasopharyngeal swab specimens and should not be used as a sole basis for treatment. Nasal washings and aspirates are unacceptable for Xpert Xpress SARS-CoV-2/FLU/RSV testing.  Fact Sheet for Patients: BloggerCourse.com  Fact Sheet for Healthcare Providers: SeriousBroker.it  This test is not yet approved or cleared by the [place] FDA and has been authorized for detection and/or diagnosis of SARS-CoV-2 by FDA under an Emergency Use Authorization (EUA). This EUA will remain in effect (meaning this test can be used) for the duration of the COVID-19 declaration under Section 564(b)(1) of the Act, 21 U.S.C. section  360bbb-3(b)(1), unless the authorization is terminated or revoked.  Performed at [facility], [address] [place], [place] [address]     Time coordinating discharge: Over 30 minutes  SIGNED:  Arnetha Courser, MD  [facility] 03/22/2021, 2:18 PM  If 7PM-7AM, please contact night-coverage [email or web address]  This record has been created using Conservation officer, historic buildings. Errors have been sought and corrected,but may not always be located. Such creation errors do not reflect on the standard of care.

## 2021-05-20 ENCOUNTER — Other Ambulatory Visit: Payer: Self-pay

## 2021-05-20 ENCOUNTER — Encounter: Payer: Self-pay | Admitting: Podiatry

## 2021-05-20 ENCOUNTER — Ambulatory Visit (INDEPENDENT_AMBULATORY_CARE_PROVIDER_SITE_OTHER): Payer: Medicare Other | Admitting: Podiatry

## 2021-05-20 DIAGNOSIS — M2041 Other hammer toe(s) (acquired), right foot: Secondary | ICD-10-CM

## 2021-05-20 DIAGNOSIS — M2042 Other hammer toe(s) (acquired), left foot: Secondary | ICD-10-CM

## 2021-05-20 DIAGNOSIS — B351 Tinea unguium: Secondary | ICD-10-CM

## 2021-05-20 DIAGNOSIS — M79676 Pain in unspecified toe(s): Secondary | ICD-10-CM | POA: Diagnosis not present

## 2021-05-20 DIAGNOSIS — N183 Chronic kidney disease, stage 3 unspecified: Secondary | ICD-10-CM

## 2021-05-20 NOTE — Progress Notes (Signed)
This patient returns to my office for at risk foot care.  This patient requires this care by a professional since this patient will be at risk due to having chronic kidney disease.  This patient is unable to cut nails herself since the patient cannot reach her nails.These nails are painful walking and wearing shoes. She presents to the office with her daughter. This patient presents for at risk foot care today. ° °General Appearance  Alert, conversant and in no acute stress. ° °Vascular  Dorsalis pedis and posterior tibial  pulses are weakly palpable  Right.  Dorsalis pedis and posterior tibial pulses absent left foot.  Capillary return is within normal limits  bilaterally. Cold feet. bilaterally. ° °Neurologic  Senn-Weinstein monofilament wire test within normal limits  bilaterally. Muscle power within normal limits bilaterally. ° °Nails Thick disfigured discolored nails with subungual debris  from hallux to fifth toes bilaterally. No evidence of bacterial infection or drainage bilaterally. ° °Orthopedic  No limitations of motion  feet .  No crepitus or effusions noted.  HAV  B/L.  Contracted digits  B/L especially second digit right  foot. ° °Skin  normotropic skin with no porokeratosis noted bilaterally.  No signs of infections or ulcers noted.  Heloma durum second toe left foot covered with necrotic tissue. ° °Onychomycosis  Pain in right toes  Pain in left toes ° °Consent was obtained for treatment procedures.   Mechanical debridement of nails 1-5  bilaterally performed with a nail nipper.  Filed with dremel without incident. Filed heloma durum  with dremel tool. Iatrogenic lesion.  ° ° °Return office visit   3 months                   Told patient to return for periodic foot care and evaluation due to potential at risk complications. ° ° °Jacy Brocker DPM  °

## 2021-06-04 ENCOUNTER — Other Ambulatory Visit (INDEPENDENT_AMBULATORY_CARE_PROVIDER_SITE_OTHER): Payer: Self-pay | Admitting: Nurse Practitioner

## 2021-06-04 DIAGNOSIS — I739 Peripheral vascular disease, unspecified: Secondary | ICD-10-CM

## 2021-06-07 ENCOUNTER — Encounter (INDEPENDENT_AMBULATORY_CARE_PROVIDER_SITE_OTHER): Payer: Self-pay | Admitting: Nurse Practitioner

## 2021-06-07 ENCOUNTER — Ambulatory Visit (INDEPENDENT_AMBULATORY_CARE_PROVIDER_SITE_OTHER): Payer: Medicare Other | Admitting: Vascular Surgery

## 2021-06-07 ENCOUNTER — Other Ambulatory Visit: Payer: Self-pay

## 2021-06-07 ENCOUNTER — Encounter (INDEPENDENT_AMBULATORY_CARE_PROVIDER_SITE_OTHER): Payer: Medicare Other

## 2021-06-07 ENCOUNTER — Ambulatory Visit (INDEPENDENT_AMBULATORY_CARE_PROVIDER_SITE_OTHER): Payer: Medicare Other | Admitting: Nurse Practitioner

## 2021-06-07 ENCOUNTER — Ambulatory Visit (INDEPENDENT_AMBULATORY_CARE_PROVIDER_SITE_OTHER): Payer: Medicare Other

## 2021-06-07 VITALS — BP 162/83 | HR 72 | Resp 17 | Ht 61.0 in | Wt 109.0 lb

## 2021-06-07 DIAGNOSIS — I739 Peripheral vascular disease, unspecified: Secondary | ICD-10-CM

## 2021-06-07 DIAGNOSIS — E782 Mixed hyperlipidemia: Secondary | ICD-10-CM

## 2021-06-07 DIAGNOSIS — I10 Essential (primary) hypertension: Secondary | ICD-10-CM | POA: Diagnosis not present

## 2021-06-07 DIAGNOSIS — I890 Lymphedema, not elsewhere classified: Secondary | ICD-10-CM

## 2021-06-13 ENCOUNTER — Encounter (INDEPENDENT_AMBULATORY_CARE_PROVIDER_SITE_OTHER): Payer: Self-pay | Admitting: Nurse Practitioner

## 2021-06-13 NOTE — Progress Notes (Signed)
Subjective:    Patient ID: Hayley Maynard, female    DOB: 1922/01/20, 87 y.o.   MRN: [id number] No chief complaint on file.   Hayley Maynard is a 87 year old female that presents today for follow-up regarding lower extremity edema and pain.  The patient denies any significant claudication-like symptoms over the last year.  However she does endorse that she does not walk frequently.  Her swelling has also been under fairly good control.  There is been no rest pain like symptoms.  She denies any wounds on her lower extremities.  She has any fever, chills nausea or vomiting.  Overall the patient's legs seem to be much improved today.   Today noninvasive studies show ABIs of 0.74 on the right and 0.62 on the left.  These are slightly improved from the previous studies 12/11/2020.  The patient has monophasic waveforms and slightly dampened toe waveforms.   Review of Systems  Cardiovascular:  Positive for leg swelling.  All other systems reviewed and are negative.     Objective:   Physical Exam Vitals reviewed.  HENT:     Head: Normocephalic.  Cardiovascular:     Rate and Rhythm: Normal rate.     Pulses:          Dorsalis pedis pulses are detected w/ Doppler on the right side and detected w/ Doppler on the left side.       Posterior tibial pulses are detected w/ Doppler on the right side and detected w/ Doppler on the left side.  Pulmonary:     Effort: Pulmonary effort is normal.  Skin:    General: Skin is warm and dry.  Neurological:     Mental Status: She is alert and oriented to person, place, and time.  Psychiatric:        Mood and Affect: Mood normal.        Behavior: Behavior normal.        Thought Content: Thought content normal.        Judgment: Judgment normal.    BP (!) 162/83 (BP Location: Left Arm)   Pulse 72   Resp 17   Ht 5\' 1"  (1.549 m)   Wt 109 lb (49.4 kg)   BMI 20.60 kg/m   Past Medical History:  Diagnosis Date   Arthritis    feet   CKD (chronic kidney  disease), stage III (HCC)    Coronary artery disease    Glaucoma    Hyperlipidemia    Hypertension    LVH (left ventricular hypertrophy)    Wears dentures    full upper and lower    Social History   Socioeconomic History   Marital status: Widowed    Spouse name: Not on file   Number of children: Not on file   Years of education: Not on file   Highest education level: Not on file  Occupational History   Not on file  Tobacco Use   Smoking status: Former    Types: Cigarettes    Quit date: 02/20/2020    Years since quitting: 1.3   Smokeless tobacco: Never   Tobacco comments:    1 pack last about 6 weeks  Vaping Use   Vaping Use: Never used  Substance and Sexual Activity   Alcohol use: Not Currently   Drug use: Not Currently   Sexual activity: Not on file  Other Topics Concern   Not on file  Social History Narrative   Not on file   Social  Determinants of Health   Financial Resource Strain: Not on file  Food Insecurity: Not on file  Transportation Needs: Not on file  Physical Activity: Not on file  Stress: Not on file  Social Connections: Not on file  Intimate Partner Violence: Not on file    Past Surgical History:  Procedure Laterality Date   ABDOMINAL HYSTERECTOMY     CATARACT EXTRACTION W/ INTRAOCULAR LENS  IMPLANT, BILATERAL     CORONARY ARTERY BYPASS GRAFT     over 10 yrs ago (per pt)   INTRAMEDULLARY (IM) NAIL INTERTROCHANTERIC Right 03/20/2020   Procedure: INTRAMEDULLARY (IM) NAIL INTERTROCHANTRIC;  Surgeon: Kennedy Bucker, MD;  Location: [facility];  Service: Orthopedics;  Laterality: Right;   PHOTOCOAGULATION WITH LASER Right 09/26/2018   Procedure: PHOTOCOAGULATION WITH LASER;  Surgeon: Lockie Mola, MD;  Location: [facility];  Service: Ophthalmology;  Laterality: Right;  laser settings: , 31.3% duty cycle, 120 seconds    History reviewed. No pertinent family history.  Allergies  Allergen Reactions   Propoxyphene Other (See  Comments)   Telmisartan-Hctz Other (See Comments)    CBC Latest Ref Rng & Units 03/21/2021 03/04/2021 03/03/2021  WBC 4.0 - 10.5 K/uL 5.2 9.3 10.6(H)  Hemoglobin 12.0 - 15.0 g/dL 11.0(L) 10.1(L) 10.5(L)  Hematocrit 36.0 - 46.0 % 35.5(L) 32.7(L) 34.2(L)  Platelets 150 - 400 K/uL 215 210 186      CMP     Component Value Date/Time   NA 139 03/21/2021 2033   K 4.9 03/21/2021 2033   CL 104 03/21/2021 2033   CO2 30 03/21/2021 2033   GLUCOSE 128 (H) 03/21/2021 2033   BUN 15 03/21/2021 2033   CREATININE 1.30 (H) 03/21/2021 2033   CALCIUM 9.3 03/21/2021 2033   PROT 6.3 (L) 03/04/2021 0539   ALBUMIN 2.9 (L) 03/04/2021 0539   AST 18 03/04/2021 0539   ALT <5 03/04/2021 0539   ALKPHOS 59 03/04/2021 0539   BILITOT 0.5 03/04/2021 0539   GFRNONAA 37 (L) 03/21/2021 2033   GFRAA 45 (L) 03/24/2020 0438     No results found.     Assessment & Plan:   1. PAD (peripheral artery disease) (HCC)  Recommend:  The patient has evidence of atherosclerosis of the lower extremities with claudication.  The patient does not voice lifestyle limiting changes at this point in time.  Noninvasive studies do not suggest clinically significant change.  No invasive studies, angiography or surgery at this time The patient should continue walking and begin a more formal exercise program.  The patient should continue antiplatelet therapy and aggressive treatment of the lipid abnormalities  No changes in the patient's medications at this time  The patient should continue wearing graduated compression socks 10-15 mmHg strength to control the mild edema.    Patient will return in 6 months with noninvasive studies 2. Lymphedema  No surgery or intervention at this point in time.    I have reviewed my previous discussion with the patient regarding swelling and why it  causes symptoms.  The patient is doing well with compression and will continue wearing graduated compression stockings class 1 (20-30 mmHg) on a  daily basis a prescription was given. The patient will  continue wearing the stockings first thing in the morning and removing them in the evening. The patient is instructed specifically not to sleep in the stockings.    In addition, behavioral modification including elevation during the day and exercise will be continued.    Patient should follow-up on an annual  basis    3. Benign essential hypertension Continue antihypertensive medications as already ordered, these medications have been reviewed and there are no changes at this time.   4. Mixed hyperlipidemia Continue statin as ordered and reviewed, no changes at this time    Current Outpatient Medications on File Prior to Visit  Medication Sig Dispense Refill   amLODipine (NORVASC) 10 MG tablet Take 10 mg by mouth daily.     amLODipine (NORVASC) 10 MG tablet Take 1 tablet by mouth daily.     aspirin EC 81 MG tablet Take 1 tablet (81 mg total) by mouth daily. Swallow whole. 150 tablet 2   brimonidine (ALPHAGAN) 0.2 % ophthalmic solution Place 1 drop into both eyes in the morning and at bedtime.      dorzolamide-timolol (COSOPT) 22.3-6.8 MG/ML ophthalmic solution Place 1 drop into both eyes 2 (two) times daily.     febuxostat (ULORIC) 40 MG tablet Take 40 mg by mouth daily.     ferrous fumarate-b12-vitamic C-folic acid (TRINSICON / FOLTRIN) capsule Take 1 capsule by mouth 2 (two) times daily after a meal.     furosemide (LASIX) 20 MG tablet TAKE 1 TABLET(20 MG) BY MOUTH EVERY DAY FOR 1 TO 3 DAYS AS NEEDED FOR SWELLING     hydrALAZINE (APRESOLINE) 50 MG tablet Take 1 tablet by mouth 2 (two) times daily.     latanoprost (XALATAN) 0.005 % ophthalmic solution Place 1 drop into both eyes at bedtime.     losartan (COZAAR) 50 MG tablet Take 50 mg by mouth daily.     losartan (COZAAR) 50 MG tablet Take 1 tablet by mouth daily.     simvastatin (ZOCOR) 20 MG tablet Take 20 mg by mouth daily.     simvastatin (ZOCOR) 20 MG tablet Take 1 tablet by  mouth at bedtime.     vitamin B-12 (CYANOCOBALAMIN) 1000 MCG tablet Take 1,000 mcg by mouth daily.     ondansetron (ZOFRAN) 4 MG tablet Take 1 tablet (4 mg total) by mouth every 8 (eight) hours as needed for up to 10 doses for nausea or vomiting. (Patient not taking: Reported on 06/07/2021) 10 tablet 0   No current facility-administered medications on file prior to visit.    There are no Patient Instructions on file for this visit. No follow-ups on file.   Georgiana Spinner, NP

## 2021-08-19 ENCOUNTER — Ambulatory Visit (INDEPENDENT_AMBULATORY_CARE_PROVIDER_SITE_OTHER): Payer: Medicare Other | Admitting: Podiatry

## 2021-08-19 ENCOUNTER — Encounter: Payer: Self-pay | Admitting: Podiatry

## 2021-08-19 ENCOUNTER — Other Ambulatory Visit: Payer: Self-pay

## 2021-08-19 DIAGNOSIS — B351 Tinea unguium: Secondary | ICD-10-CM | POA: Diagnosis not present

## 2021-08-19 DIAGNOSIS — L989 Disorder of the skin and subcutaneous tissue, unspecified: Secondary | ICD-10-CM

## 2021-08-19 DIAGNOSIS — M79676 Pain in unspecified toe(s): Secondary | ICD-10-CM | POA: Diagnosis not present

## 2021-08-19 DIAGNOSIS — N183 Chronic kidney disease, stage 3 unspecified: Secondary | ICD-10-CM

## 2021-08-19 NOTE — Progress Notes (Signed)
This patient returns to my office for at risk foot care.  This patient requires this care by a professional since this patient will be at risk due to having chronic kidney disease.  This patient is unable to cut nails herself since the patient cannot reach her nails.These nails are painful walking and wearing shoes. She presents to the office with her daughter. This patient presents for at risk foot care today.  General Appearance  Alert, conversant and in no acute stress.  Vascular  Dorsalis pedis and posterior tibial  pulses are weakly palpable  Right.  Dorsalis pedis and posterior tibial pulses absent left foot.  Capillary return is within normal limits  bilaterally. Cold feet. bilaterally.  Neurologic  Senn-Weinstein monofilament wire test within normal limits  bilaterally. Muscle power within normal limits bilaterally.  Nails Thick disfigured discolored nails with subungual debris  from hallux to fifth toes bilaterally. No evidence of bacterial infection or drainage bilaterally.  Orthopedic  No limitations of motion  feet .  No crepitus or effusions noted.  HAV  B/L.  Contracted digits  B/L especially second digit right  foot.  Skin  normotropic skin with no porokeratosis noted bilaterally.  No signs of infections or ulcers noted.  Heloma durum second toe left foot covered with necrotic tissue.  Onychomycosis  Pain in right toes  Pain in left toes  Consent was obtained for treatment procedures.   Mechanical debridement of nails 1-5  bilaterally performed with a nail nipper.  Filed with dremel without incident. Filed heloma durum  with dremel tool. Iatrogenic lesion.    Return office visit   3 months                   Told patient to return for periodic foot care and evaluation due to potential at risk complications.   Helane Gunther DPM

## 2021-10-11 IMAGING — CR DG CHEST 2V
2 series · 2 of 2 positions shown · non-contrast
Comparison: None.

CLINICAL DATA: [AGE] with cough and congestion.

EXAM:
CHEST - 2 VIEW

[chest lat]
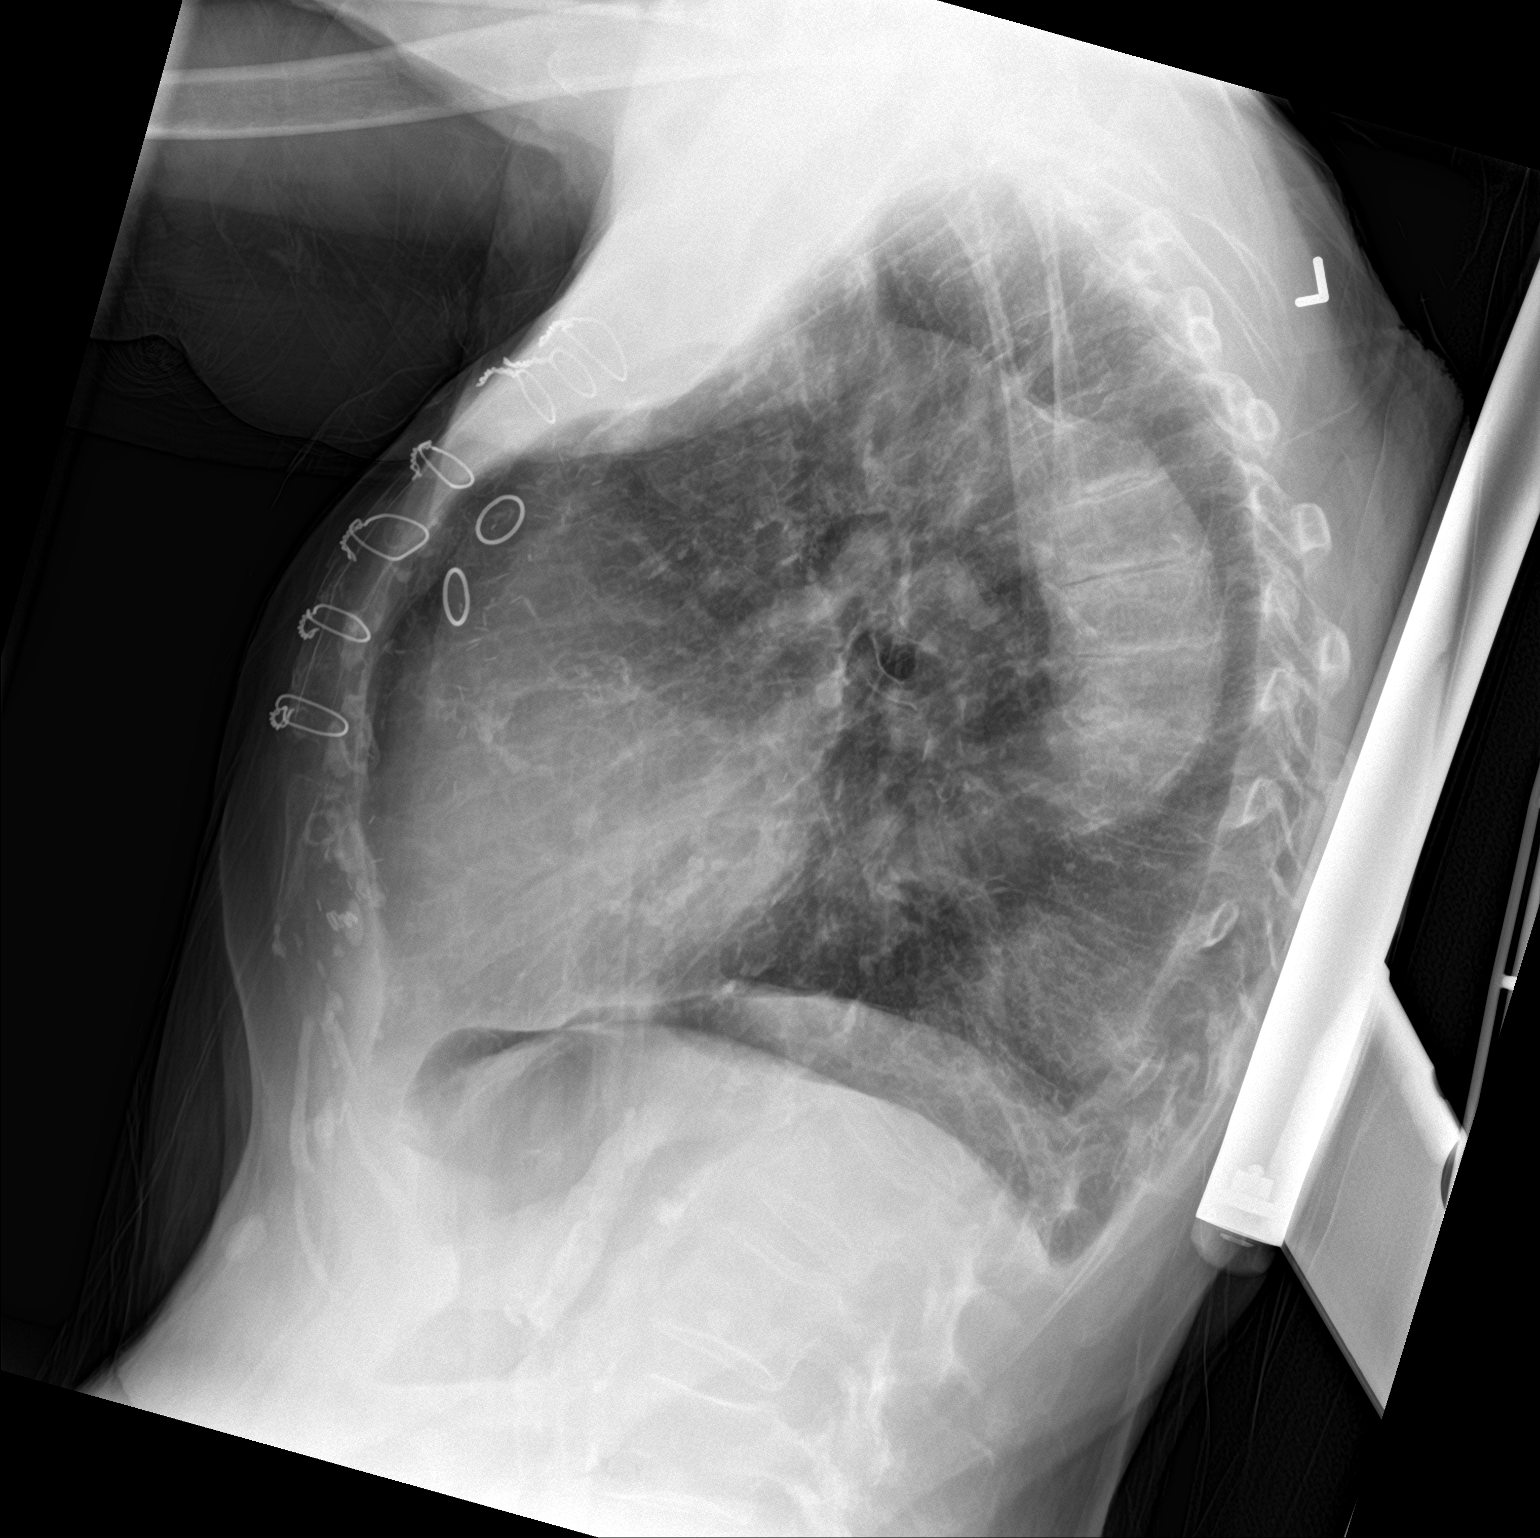

[chest ap]
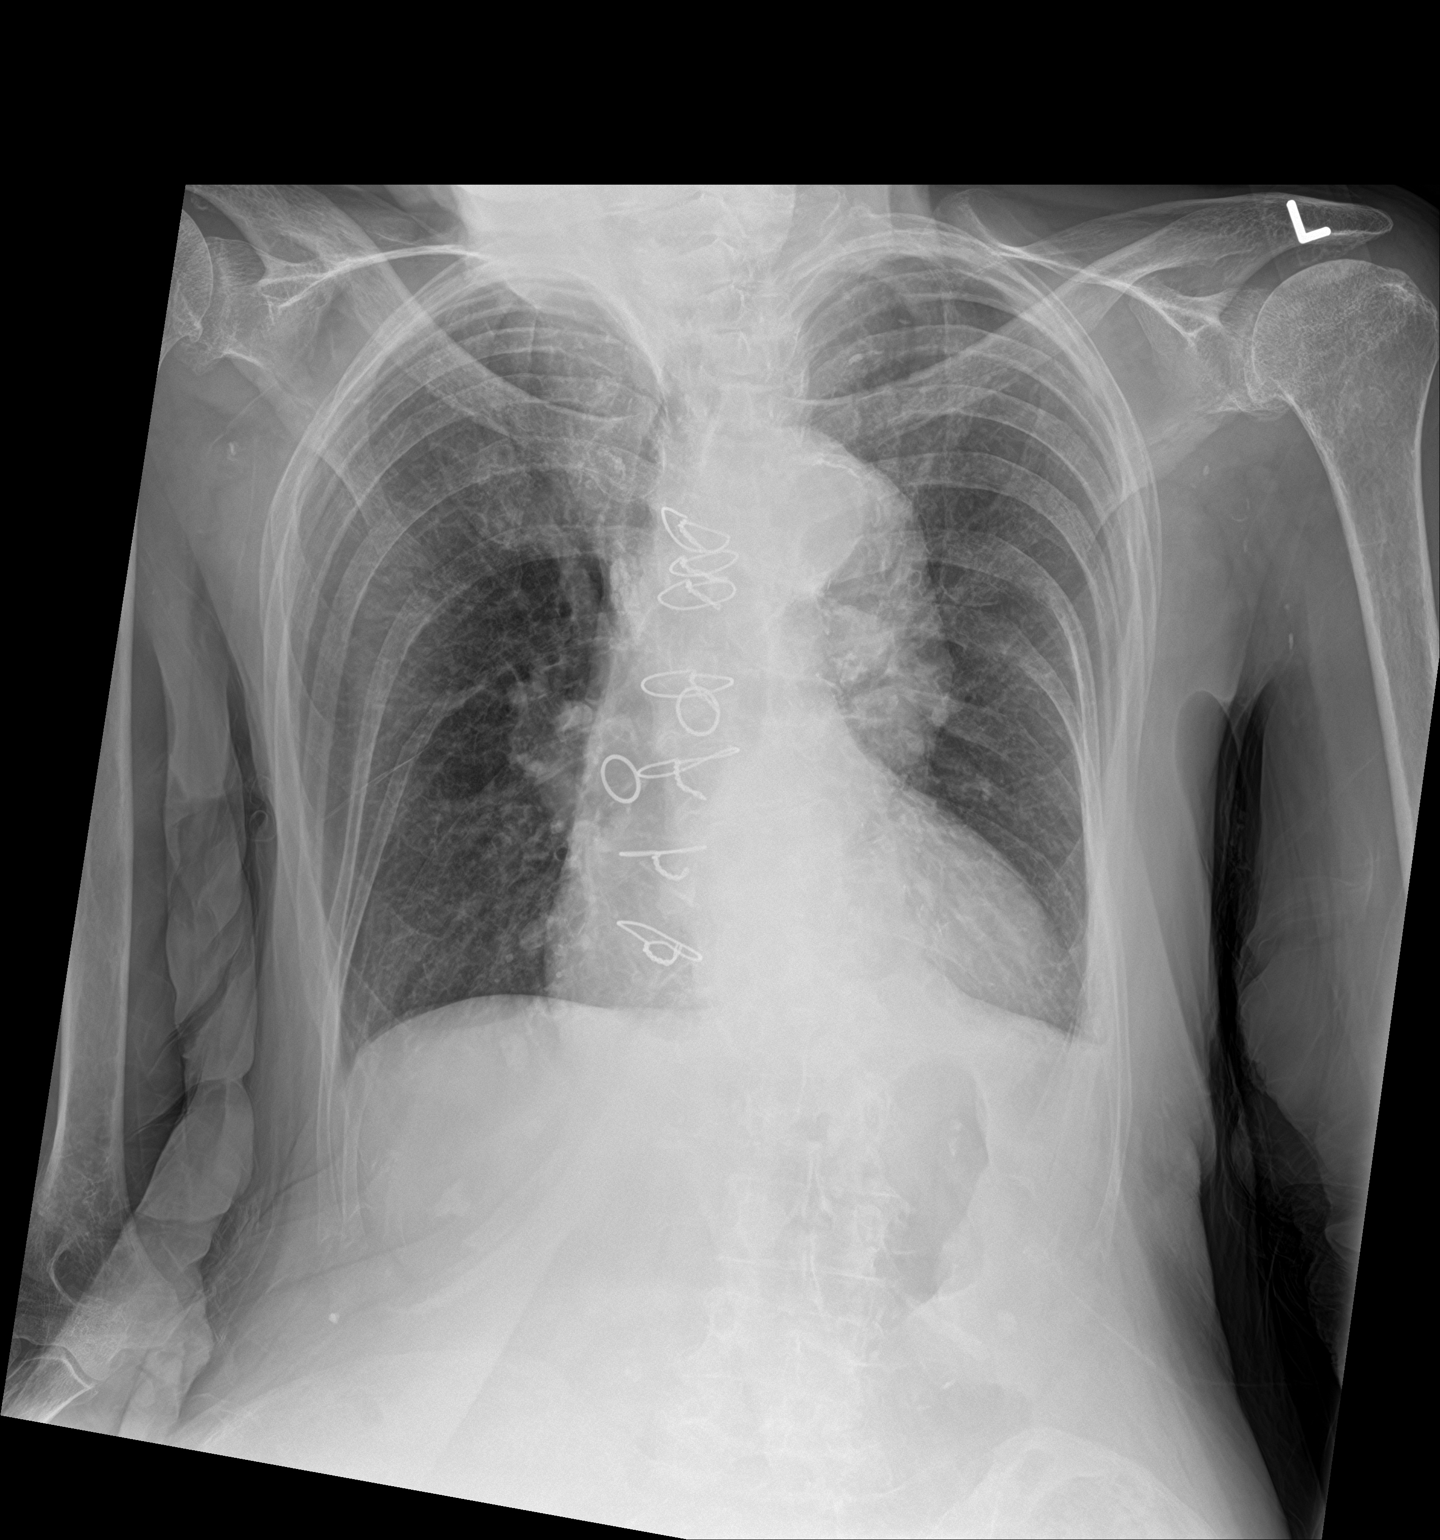

[2 of 2 positions shown; findings below may reference images not displayed]

FINDINGS: Post median sternotomy. Upper normal heart size. Aortic
atherosclerosis and tortuosity. Increased AP diameter of the thorax.
Patchy basilar opacity likely localizing to the left lower lobe.
Trace pleural effusions. Mild interstitial coarsening. No
pneumothorax. Bones are under mineralized.
IMPRESSION: 1. Patchy left lower lobe opacity suspicious for pneumonia in the
setting of cough.
2. Trace pleural effusions.
3. Post CABG.  Aortic atherosclerosis and tortuosity.

Aortic Atherosclerosis ([id number]-K4Y.Y).

## 2021-11-18 ENCOUNTER — Encounter: Payer: Self-pay | Admitting: Podiatry

## 2021-11-18 ENCOUNTER — Ambulatory Visit (INDEPENDENT_AMBULATORY_CARE_PROVIDER_SITE_OTHER): Payer: Medicare Other | Admitting: Podiatry

## 2021-11-18 DIAGNOSIS — M79676 Pain in unspecified toe(s): Secondary | ICD-10-CM

## 2021-11-18 DIAGNOSIS — B351 Tinea unguium: Secondary | ICD-10-CM | POA: Diagnosis not present

## 2021-11-18 DIAGNOSIS — M2041 Other hammer toe(s) (acquired), right foot: Secondary | ICD-10-CM | POA: Diagnosis not present

## 2021-11-18 DIAGNOSIS — M2042 Other hammer toe(s) (acquired), left foot: Secondary | ICD-10-CM

## 2021-11-18 DIAGNOSIS — N183 Chronic kidney disease, stage 3 unspecified: Secondary | ICD-10-CM | POA: Diagnosis not present

## 2021-11-18 NOTE — Progress Notes (Signed)
This patient returns to my office for at risk foot care.  This patient requires this care by a professional since this patient will be at risk due to having chronic kidney disease.  This patient is unable to cut nails herself since the patient cannot reach her nails.These nails are painful walking and wearing shoes. She presents to the office with her daughter. This patient presents for at risk foot care today. ? ?General Appearance  Alert, conversant and in no acute stress. ? ?Vascular  Dorsalis pedis and posterior tibial  pulses are weakly palpable  Right.  Dorsalis pedis and posterior tibial pulses absent left foot.  Capillary return is within normal limits  bilaterally. Cold feet. bilaterally. ? ?Neurologic  Senn-Weinstein monofilament wire test within normal limits  bilaterally. Muscle power within normal limits bilaterally. ? ?Nails Thick disfigured discolored nails with subungual debris  from hallux to fifth toes bilaterally. No evidence of bacterial infection or drainage bilaterally. ? ?Orthopedic  No limitations of motion  feet .  No crepitus or effusions noted.  HAV  B/L.  Contracted digits  B/L especially second digit right  foot. ? ?Skin  normotropic skin with no porokeratosis noted bilaterally.  No signs of infections or ulcers noted.  Heloma durum second toe left foot covered with necrotic tissue. ? ?Onychomycosis  Pain in right toes  Pain in left toes ? ?Consent was obtained for treatment procedures.   Mechanical debridement of nails 1-5  bilaterally performed with a nail nipper.  Filed with dremel without incident.  ? ? ?Return office visit   3 months                   Told patient to return for periodic foot care and evaluation due to potential at risk complications. ? ? ?Hayley Maynard DPM  ?

## 2021-12-01 ENCOUNTER — Other Ambulatory Visit (INDEPENDENT_AMBULATORY_CARE_PROVIDER_SITE_OTHER): Payer: Self-pay | Admitting: Nurse Practitioner

## 2021-12-01 DIAGNOSIS — I739 Peripheral vascular disease, unspecified: Secondary | ICD-10-CM

## 2021-12-06 ENCOUNTER — Ambulatory Visit (INDEPENDENT_AMBULATORY_CARE_PROVIDER_SITE_OTHER): Payer: Medicare Other

## 2021-12-06 ENCOUNTER — Encounter (INDEPENDENT_AMBULATORY_CARE_PROVIDER_SITE_OTHER): Payer: Self-pay | Admitting: Vascular Surgery

## 2021-12-06 ENCOUNTER — Ambulatory Visit (INDEPENDENT_AMBULATORY_CARE_PROVIDER_SITE_OTHER): Payer: Medicare Other | Admitting: Vascular Surgery

## 2021-12-06 VITALS — BP 142/71 | HR 69 | Resp 15 | Wt 104.0 lb

## 2021-12-06 DIAGNOSIS — I70213 Atherosclerosis of native arteries of extremities with intermittent claudication, bilateral legs: Secondary | ICD-10-CM | POA: Diagnosis not present

## 2021-12-06 DIAGNOSIS — I739 Peripheral vascular disease, unspecified: Secondary | ICD-10-CM | POA: Diagnosis not present

## 2021-12-06 DIAGNOSIS — I2510 Atherosclerotic heart disease of native coronary artery without angina pectoris: Secondary | ICD-10-CM | POA: Diagnosis not present

## 2021-12-06 DIAGNOSIS — I70219 Atherosclerosis of native arteries of extremities with intermittent claudication, unspecified extremity: Secondary | ICD-10-CM | POA: Insufficient documentation

## 2021-12-06 DIAGNOSIS — I10 Essential (primary) hypertension: Secondary | ICD-10-CM | POA: Diagnosis not present

## 2021-12-06 DIAGNOSIS — I890 Lymphedema, not elsewhere classified: Secondary | ICD-10-CM | POA: Diagnosis not present

## 2021-12-06 NOTE — Progress Notes (Signed)
? ? ? ? ?MRN : [id number] ? ?Hayley Maynard is a 88 y.o. ([birth year]) female who presents with chief complaint of check circulation. ? ?History of Present Illness: The patient returns to the office for followup and review of the noninvasive studies.  ? ?There have been no interval changes in lower extremity symptoms. No interval shortening of the patient's claudication distance or development of rest pain symptoms. No new ulcers or wounds have occurred since the last visit. ? ?There have been no significant changes to the patient's overall health care. ? ?The patient denies amaurosis fugax or recent TIA symptoms. There are no documented recent neurological changes noted. ?There is no history of DVT, PE or superficial thrombophlebitis. ?The patient denies recent episodes of angina or shortness of breath.  ? ?ABI Rt=0.76 and Lt=0.62  (previous ABI's Rt=0.74 and Lt=0.62) ? ? ?No outpatient medications have been marked as taking for the 12/06/21 encounter (Appointment) with Delana Meyer, Dolores Lory, MD.  ? ? ?Past Medical History:  ?Diagnosis Date  ? Arthritis   ? feet  ? CKD (chronic kidney disease), stage III ([place])   ? Coronary artery disease   ? Glaucoma   ? Hyperlipidemia   ? Hypertension   ? LVH (left ventricular hypertrophy)   ? Wears dentures   ? full upper and lower  ? ? ?Past Surgical History:  ?Procedure Laterality Date  ? ABDOMINAL HYSTERECTOMY    ? CATARACT EXTRACTION W/ INTRAOCULAR LENS  IMPLANT, BILATERAL    ? CORONARY ARTERY BYPASS GRAFT    ? over 10 yrs ago (per pt)  ? INTRAMEDULLARY (IM) NAIL INTERTROCHANTERIC Right 03/20/2020  ? Procedure: INTRAMEDULLARY (IM) NAIL INTERTROCHANTRIC;  Surgeon: Hessie Knows, MD;  Location: [facility];  Service: Orthopedics;  Laterality: Right;  ? PHOTOCOAGULATION WITH LASER Right 09/26/2018  ? Procedure: PHOTOCOAGULATION WITH LASER;  Surgeon: Leandrew Koyanagi, MD;  Location: [place];  Service: Ophthalmology;  Laterality: Right;  laser settings: 2054mW, 31.3% duty cycle,  120 seconds  ? ? ?Social History ?Social History  ? ?Tobacco Use  ? Smoking status: Former  ?  Types: Cigarettes  ?  Quit date: 02/20/2020  ?  Years since quitting: 1.7  ? Smokeless tobacco: Never  ? Tobacco comments:  ?  1 pack last about 6 weeks  ?Vaping Use  ? Vaping Use: Never used  ?Substance Use Topics  ? Alcohol use: Not Currently  ? Drug use: Not Currently  ? ? ?Family History ?No family history on file. ? ?Allergies  ?Allergen Reactions  ? Propoxyphene Other (See Comments)  ? Telmisartan-Hctz Other (See Comments)  ? ? ? ?REVIEW OF SYSTEMS (Negative unless checked) ? ?Constitutional: [] Weight loss  [] Fever  [] Chills ?Cardiac: [] Chest pain   [] Chest pressure   [] Palpitations   [] Shortness of breath when laying flat   [] Shortness of breath with exertion. ?Vascular:  [x] Pain in legs with walking   [] Pain in legs at rest  [] History of DVT   [] Phlebitis   [] Swelling in legs   [] Varicose veins   [] Non-healing ulcers ?Pulmonary:   [] Uses home oxygen   [] Productive cough   [] Hemoptysis   [] Wheeze  [] COPD   [] Asthma ?Neurologic:  [] Dizziness   [] Seizures   [] History of stroke   [] History of TIA  [] Aphasia   [] Vissual changes   [] Weakness or numbness in arm   [] Weakness or numbness in leg ?Musculoskeletal:   [] Joint swelling   [] Joint pain   [] Low back pain ?Hematologic:  [] Easy bruising  [] Easy bleeding   []   Hypercoagulable state   [] Anemic ?Gastrointestinal:  [] Diarrhea   [] Vomiting  [] Gastroesophageal reflux/heartburn   [] Difficulty swallowing. ?Genitourinary:  [] Chronic kidney disease   [] Difficult urination  [] Frequent urination   [] Blood in urine ?Skin:  [] Rashes   [] Ulcers  ?Psychological:  [] History of anxiety   []  History of major depression. ? ?Physical Examination ? ?There were no vitals filed for this visit. ?There is no height or weight on file to calculate BMI. ?Gen: WD/WN, NAD ?Head: [place]/AT, No temporalis wasting.  ?Ear/Nose/Throat: Hearing grossly intact, nares w/o erythema or drainage ?Eyes: PER, EOMI,  sclera nonicteric.  ?Neck: Supple, no masses.  No bruit or JVD.  ?Pulmonary:  Good air movement, no audible wheezing, no use of accessory muscles.  ?Cardiac: RRR, normal S1, S2, no Murmurs. ?Vascular:  mild trophic changes, no open wounds, mild venous changes mild edema ?Vessel Right Left  ?Radial Palpable Palpable  ?PT Not Palpable Not Palpable  ?DP Not Palpable Not Palpable  ?Gastrointestinal: soft, non-distended. No guarding/no peritoneal signs.  ?Musculoskeletal: M/S 5/5 throughout.  No visible deformity.  ?Neurologic: CN 2-12 intact. Pain and light touch intact in extremities.  Symmetrical.  Speech is fluent. Motor exam as listed above. ?Psychiatric: Judgment intact, Mood & affect appropriate for pt's clinical situation. ?Dermatologic: No rashes or ulcers noted.  No changes consistent with cellulitis. ? ? ?CBC ?Lab Results  ?Component Value Date  ? WBC 5.2 03/21/2021  ? HGB 11.0 (L) 03/21/2021  ? HCT 35.5 (L) 03/21/2021  ? MCV 94.9 03/21/2021  ? PLT 215 03/21/2021  ? ? ?BMET ?   ?Component Value Date/Time  ? NA 139 03/21/2021 2033  ? K 4.9 03/21/2021 2033  ? CL 104 03/21/2021 2033  ? CO2 30 03/21/2021 2033  ? GLUCOSE 128 (H) 03/21/2021 2033  ? BUN 15 03/21/2021 2033  ? CREATININE 1.30 (H) 03/21/2021 2033  ? CALCIUM 9.3 03/21/2021 2033  ? GFRNONAA 37 (L) 03/21/2021 2033  ? GFRAA 45 (L) 03/24/2020 0438  ? ?CrCl cannot be calculated (Patient's most recent lab result is older than the maximum 21 days allowed.). ? ?COAG ?Lab Results  ?Component Value Date  ? INR 1.1 03/21/2021  ? ? ?Radiology ?No results found. ? ? ?Assessment/Plan ?1. Atherosclerosis of native artery of both lower extremities with intermittent claudication ([place]) ? Recommend: ? ?The patient has evidence of atherosclerosis of the lower extremities with claudication.  The patient does not voice lifestyle limiting changes at this point in time. ? ?Noninvasive studies do not suggest clinically significant change. ? ?No invasive studies, angiography or  surgery at this time ?The patient should continue an exercise program as tolerated.  ?The patient should continue antiplatelet therapy and aggressive treatment of the lipid abnormalities ? ?No changes in the patient's medications at this time ? ?Continued surveillance is indicated as atherosclerosis is likely to progress with time.   ? ?The patient will continue follow up with noninvasive studies as ordered.   ?- VAS [place] ABI WITH/WO TBI; Future ? ?2. Benign essential hypertension ?Continue antihypertensive medications as already ordered, these medications have been reviewed and there are no changes at this time.  ? ?3. Coronary artery disease involving native heart, unspecified vessel or lesion type, unspecified whether angina present ?Continue cardiac and antihypertensive medications as already ordered and reviewed, no changes at this time. ? ?Continue statin as ordered and reviewed, no changes at this time ? ?Nitrates PRN for chest pain  ? ?4. Lymphedema ?Recommend: ? ?No surgery or intervention at this point in  time. ? ?Patient will contnue wearing graduated compression stockings on a daily basis, as this has provided excellent control of his edema. The patient will put the stockings on first thing in the morning and removing them in the evening. The patient is reminded not to sleep in the stockings. ? ?In addition, behavioral modification including elevation during the day will be initiated. ?Exercise is strongly encouraged. ? ?Previous duplex ultrasound of the lower extremities shows normal deep system, no significant superficial reflux was identified. ? ?Given the patient's good control and lack of any problems regarding the venous insufficiency and lymphedema a lymph pump in not need at this time.   ? ? ? ?Hortencia Pilar, MD ? ?12/06/2021 ?9:45 AM ? ?  ?

## 2022-02-17 ENCOUNTER — Ambulatory Visit: Payer: Medicare Other | Admitting: Podiatry

## 2022-03-16 ENCOUNTER — Other Ambulatory Visit: Payer: Self-pay | Admitting: Family Medicine

## 2022-03-16 ENCOUNTER — Ambulatory Visit
Admission: RE | Admit: 2022-03-16 | Discharge: 2022-03-16 | Disposition: A | Discharge status: Home / Self Care | Payer: Medicare Other | Source: Ambulatory Visit | Attending: Family Medicine | Admitting: Family Medicine

## 2022-03-16 DIAGNOSIS — R600 Localized edema: Secondary | ICD-10-CM | POA: Insufficient documentation

## 2022-03-28 ENCOUNTER — Other Ambulatory Visit: Payer: Self-pay | Admitting: Specialist

## 2022-03-28 DIAGNOSIS — J90 Pleural effusion, not elsewhere classified: Secondary | ICD-10-CM

## 2022-03-31 ENCOUNTER — Ambulatory Visit
Admission: RE | Admit: 2022-03-31 | Discharge: 2022-03-31 | Disposition: A | Discharge status: Home / Self Care | Payer: Medicare Other | Source: Ambulatory Visit | Attending: Specialist | Admitting: Specialist

## 2022-03-31 ENCOUNTER — Ambulatory Visit
Admission: RE | Admit: 2022-03-31 | Discharge: 2022-03-31 | Disposition: A | Discharge status: Home / Self Care | Payer: Medicare Other | Source: Ambulatory Visit | Attending: Radiology | Admitting: Radiology

## 2022-03-31 ENCOUNTER — Other Ambulatory Visit: Payer: Self-pay | Admitting: Radiology

## 2022-03-31 DIAGNOSIS — Z951 Presence of aortocoronary bypass graft: Secondary | ICD-10-CM | POA: Diagnosis not present

## 2022-03-31 DIAGNOSIS — I700 Atherosclerosis of aorta: Secondary | ICD-10-CM | POA: Insufficient documentation

## 2022-03-31 DIAGNOSIS — Z9889 Other specified postprocedural states: Secondary | ICD-10-CM

## 2022-03-31 DIAGNOSIS — I517 Cardiomegaly: Secondary | ICD-10-CM | POA: Insufficient documentation

## 2022-03-31 DIAGNOSIS — J90 Pleural effusion, not elsewhere classified: Secondary | ICD-10-CM | POA: Diagnosis present

## 2022-03-31 LAB — BODY FLUID CELL COUNT WITH DIFFERENTIAL
Eos, Fluid: 0 %
Lymphs, Fluid: 81 %
Monocyte-Macrophage-Serous Fluid: 17 %
Neutrophil Count, Fluid: 2 %
Total Nucleated Cell Count, Fluid: 306 cu mm

## 2022-03-31 LAB — AMYLASE, PLEURAL OR PERITONEAL FLUID: Amylase, Fluid: 25 U/L

## 2022-03-31 LAB — GLUCOSE, PLEURAL OR PERITONEAL FLUID: Glucose, Fluid: 105 mg/dL

## 2022-03-31 LAB — PROTEIN, PLEURAL OR PERITONEAL FLUID: Total protein, fluid: <3 g/dL

## 2022-03-31 LAB — LACTATE DEHYDROGENASE, PLEURAL OR PERITONEAL FLUID: LD, Fluid: 63 U/L — ABNORMAL HIGH (ref 3–23)

## 2022-03-31 NOTE — Procedures (Signed)
PROCEDURE SUMMARY:  Successful US guided left thoracentesis. Yielded 200 mL of clear yellow fluid. Pt tolerated procedure well. No immediate complications.  Specimen was sent for labs. CXR ordered.  EBL < 1 mL  Cloretta Ned 03/31/2022 2:17 PM

## 2022-04-01 LAB — PROTEIN, BODY FLUID (OTHER): Total Protein, Body Fluid Other: 2.9 g/dL

## 2022-04-02 LAB — ACID FAST SMEAR (AFB, MYCOBACTERIA): Acid Fast Smear: NEGATIVE

## 2022-04-03 LAB — CHOLESTEROL, BODY FLUID: Cholesterol, Fluid: 33 mg/dL

## 2022-04-03 LAB — BODY FLUID CULTURE W GRAM STAIN: Culture: NO GROWTH

## 2022-04-04 LAB — CYTOLOGY - NON PAP

## 2022-04-05 LAB — MISC LABCORP TEST (SEND OUT): Labcorp test code: 5367

## 2022-05-03 LAB — FUNGUS CULTURE RESULT

## 2022-05-03 LAB — FUNGAL ORGANISM REFLEX

## 2022-05-03 LAB — FUNGUS CULTURE WITH STAIN

## 2022-05-16 LAB — ACID FAST CULTURE WITH REFLEXED SENSITIVITIES (MYCOBACTERIA): Acid Fast Culture: NEGATIVE

## 2022-06-20 ENCOUNTER — Encounter (INDEPENDENT_AMBULATORY_CARE_PROVIDER_SITE_OTHER): Payer: Self-pay

## 2022-08-31 ENCOUNTER — Inpatient Hospital Stay
Admission: EM | Admit: 2022-08-31 | Discharge: 2022-09-03 | DRG: 683 | Disposition: A | Discharge status: Home Health | Payer: Medicare Other | Attending: Osteopathic Medicine | Admitting: Osteopathic Medicine

## 2022-08-31 ENCOUNTER — Emergency Department: Payer: Medicare Other

## 2022-08-31 ENCOUNTER — Other Ambulatory Visit: Payer: Self-pay

## 2022-08-31 ENCOUNTER — Encounter: Payer: Self-pay | Admitting: *Deleted

## 2022-08-31 DIAGNOSIS — I4892 Unspecified atrial flutter: Secondary | ICD-10-CM | POA: Diagnosis present

## 2022-08-31 DIAGNOSIS — I2510 Atherosclerotic heart disease of native coronary artery without angina pectoris: Secondary | ICD-10-CM | POA: Diagnosis present

## 2022-08-31 DIAGNOSIS — I129 Hypertensive chronic kidney disease with stage 1 through stage 4 chronic kidney disease, or unspecified chronic kidney disease: Secondary | ICD-10-CM | POA: Diagnosis present

## 2022-08-31 DIAGNOSIS — Z8249 Family history of ischemic heart disease and other diseases of the circulatory system: Secondary | ICD-10-CM | POA: Diagnosis not present

## 2022-08-31 DIAGNOSIS — Z79899 Other long term (current) drug therapy: Secondary | ICD-10-CM | POA: Diagnosis not present

## 2022-08-31 DIAGNOSIS — N189 Chronic kidney disease, unspecified: Secondary | ICD-10-CM | POA: Diagnosis not present

## 2022-08-31 DIAGNOSIS — E860 Dehydration: Secondary | ICD-10-CM | POA: Diagnosis present

## 2022-08-31 DIAGNOSIS — E875 Hyperkalemia: Secondary | ICD-10-CM

## 2022-08-31 DIAGNOSIS — N184 Chronic kidney disease, stage 4 (severe): Secondary | ICD-10-CM | POA: Diagnosis present

## 2022-08-31 DIAGNOSIS — Z87891 Personal history of nicotine dependence: Secondary | ICD-10-CM

## 2022-08-31 DIAGNOSIS — N281 Cyst of kidney, acquired: Secondary | ICD-10-CM | POA: Diagnosis present

## 2022-08-31 DIAGNOSIS — Z951 Presence of aortocoronary bypass graft: Secondary | ICD-10-CM

## 2022-08-31 DIAGNOSIS — I440 Atrioventricular block, first degree: Secondary | ICD-10-CM | POA: Diagnosis present

## 2022-08-31 DIAGNOSIS — I10 Essential (primary) hypertension: Secondary | ICD-10-CM

## 2022-08-31 DIAGNOSIS — Z803 Family history of malignant neoplasm of breast: Secondary | ICD-10-CM | POA: Diagnosis not present

## 2022-08-31 DIAGNOSIS — D631 Anemia in chronic kidney disease: Secondary | ICD-10-CM | POA: Diagnosis present

## 2022-08-31 DIAGNOSIS — M109 Gout, unspecified: Secondary | ICD-10-CM | POA: Diagnosis present

## 2022-08-31 DIAGNOSIS — E785 Hyperlipidemia, unspecified: Secondary | ICD-10-CM | POA: Diagnosis present

## 2022-08-31 DIAGNOSIS — H409 Unspecified glaucoma: Secondary | ICD-10-CM | POA: Diagnosis present

## 2022-08-31 DIAGNOSIS — N179 Acute kidney failure, unspecified: Secondary | ICD-10-CM | POA: Diagnosis present

## 2022-08-31 DIAGNOSIS — M1990 Unspecified osteoarthritis, unspecified site: Secondary | ICD-10-CM | POA: Diagnosis present

## 2022-08-31 DIAGNOSIS — Z888 Allergy status to other drugs, medicaments and biological substances status: Secondary | ICD-10-CM | POA: Diagnosis not present

## 2022-08-31 DIAGNOSIS — Z90710 Acquired absence of both cervix and uterus: Secondary | ICD-10-CM | POA: Diagnosis not present

## 2022-08-31 LAB — BASIC METABOLIC PANEL
Anion gap: 8 (ref 5–15)
BUN: 51 mg/dL — ABNORMAL HIGH (ref 8–23)
CO2: 21 mmol/L — ABNORMAL LOW (ref 22–32)
Calcium: 8.8 mg/dL — ABNORMAL LOW (ref 8.9–10.3)
Chloride: 111 mmol/L (ref 98–111)
Creatinine, Ser: 2.76 mg/dL — ABNORMAL HIGH (ref 0.44–1.00)
GFR, Estimated: 15 mL/min — ABNORMAL LOW (ref >60)
Glucose, Bld: 108 mg/dL — ABNORMAL HIGH (ref 70–99)
Potassium: 6.7 mmol/L (ref 3.5–5.1)
Sodium: 140 mmol/L (ref 135–145)

## 2022-08-31 LAB — CBC
HCT: 34.2 % — ABNORMAL LOW (ref 36.0–46.0)
Hemoglobin: 10.5 g/dL — ABNORMAL LOW (ref 12.0–15.0)
MCH: 29.9 pg (ref 26.0–34.0)
MCHC: 30.7 g/dL (ref 30.0–36.0)
MCV: 97.4 fL (ref 80.0–100.0)
Platelets: 217 10*3/uL (ref 150–400)
RBC: 3.51 MIL/uL — ABNORMAL LOW (ref 3.87–5.11)
RDW: 17.9 % — ABNORMAL HIGH (ref 11.5–15.5)
WBC: 7.3 10*3/uL (ref 4.0–10.5)
nRBC: 0 % (ref 0.0–0.2)

## 2022-08-31 LAB — TROPONIN I (HIGH SENSITIVITY)
Troponin I (High Sensitivity): 35 ng/L — ABNORMAL HIGH (ref <18)
Troponin I (High Sensitivity): 37 ng/L — ABNORMAL HIGH (ref <18)

## 2022-08-31 LAB — GLUCOSE, RANDOM: Glucose, Bld: 96 mg/dL (ref 70–99)

## 2022-08-31 MED ORDER — DEXTROSE 50 % IV SOLN
1.0000 | Freq: Once | INTRAVENOUS | Status: AC
  Administered 2022-08-31: 50 mL via INTRAVENOUS
  Filled 2022-08-31: qty 50

## 2022-08-31 MED ORDER — INSULIN ASPART 100 UNIT/ML IV SOLN
5.0000 [IU] | Freq: Once | INTRAVENOUS | Status: AC
  Administered 2022-08-31: 5 [IU] via INTRAVENOUS
  Filled 2022-08-31: qty 0.05

## 2022-08-31 MED ORDER — SODIUM CHLORIDE 0.9 % IV BOLUS
1000.0000 mL | Freq: Once | INTRAVENOUS | Status: AC
  Administered 2022-08-31: 1000 mL via INTRAVENOUS

## 2022-08-31 MED ORDER — SODIUM ZIRCONIUM CYCLOSILICATE 10 G PO PACK
10.0000 g | PACK | Freq: Once | ORAL | Status: AC
  Administered 2022-08-31: 10 g via ORAL
  Filled 2022-08-31: qty 1

## 2022-08-31 MED ORDER — SODIUM BICARBONATE 8.4 % IV SOLN
50.0000 meq | Freq: Once | INTRAVENOUS | Status: AC
  Administered 2022-08-31: 50 meq via INTRAVENOUS
  Filled 2022-08-31: qty 50

## 2022-08-31 MED ORDER — CALCIUM GLUCONATE-NACL 1-0.675 GM/50ML-% IV SOLN
1.0000 g | Freq: Once | INTRAVENOUS | Status: AC
  Administered 2022-08-31: 1000 mg via INTRAVENOUS
  Filled 2022-08-31: qty 50

## 2022-08-31 NOTE — ED Triage Notes (Signed)
Pt to triage via wheelchair.  Pt saw her PMD today and has a high potassium.  Pt sent to the ER for eval.  Pt has swelling to feet/legs.  Pt alert.

## 2022-08-31 NOTE — H&P (Signed)
[place]   PATIENT NAME: Hayley Maynard    MR#:  [id number]  DATE OF BIRTH:  [birth year]  DATE OF ADMISSION:  08/31/2022  PRIMARY CARE PHYSICIAN: Maryland Pink, MD   Patient is coming from: Home  REQUESTING/REFERRING PHYSICIAN: Lucillie Garfinkel, MD  CHIEF COMPLAINT:   Chief Complaint  Patient presents with  . abnormal labs    HISTORY OF PRESENT ILLNESS:  Hayley Maynard is a 89 y.o. female with medical history significant for ***  ED Course: *** EKG as reviewed by me : *** Imaging: *** PAST MEDICAL HISTORY:   Past Medical History:  Diagnosis Date  . Arthritis    feet  . CKD (chronic kidney disease), stage III ([place])   . Coronary artery disease   . Glaucoma   . Hyperlipidemia   . Hypertension   . LVH (left ventricular hypertrophy)   . Wears dentures    full upper and lower    PAST SURGICAL HISTORY:   Past Surgical History:  Procedure Laterality Date  . ABDOMINAL HYSTERECTOMY    . CATARACT EXTRACTION W/ INTRAOCULAR LENS  IMPLANT, BILATERAL    . CORONARY ARTERY BYPASS GRAFT     over 10 yrs ago (per pt)  . INTRAMEDULLARY (IM) NAIL INTERTROCHANTERIC Right 03/20/2020   Procedure: INTRAMEDULLARY (IM) NAIL INTERTROCHANTRIC;  Surgeon: Hessie Knows, MD;  Location: [facility];  Service: Orthopedics;  Laterality: Right;  . PHOTOCOAGULATION WITH LASER Right 09/26/2018   Procedure: PHOTOCOAGULATION WITH LASER;  Surgeon: Leandrew Koyanagi, MD;  Location: [place];  Service: Ophthalmology;  Laterality: Right;  laser settings: 2062mW, 31.3% duty cycle, 120 seconds    SOCIAL HISTORY:   Social History   Tobacco Use  . Smoking status: Former    Types: Cigarettes    Quit date: 02/20/2020    Years since quitting: 2.5  . Smokeless tobacco: Never  . Tobacco comments:    1 pack last about 6 weeks  Substance Use Topics  . Alcohol use: Not Currently    FAMILY HISTORY:  History reviewed. No pertinent family history.  DRUG ALLERGIES:   Allergies  Allergen  Reactions  . Propoxyphene Other (See Comments)  . Telmisartan-Hctz Other (See Comments)    REVIEW OF SYSTEMS:   ROS As per history of present illness. All pertinent systems were reviewed above. Constitutional, HEENT, cardiovascular, respiratory, GI, GU, musculoskeletal, neuro, psychiatric, endocrine, integumentary and hematologic systems were reviewed and are otherwise negative/unremarkable except for positive findings mentioned above in the HPI.   MEDICATIONS AT HOME:   Prior to Admission medications   Medication Sig Start Date End Date Taking? Authorizing Provider  amLODipine (NORVASC) 10 MG tablet Take 10 mg by mouth daily. 04/28/20   [provider]  amLODipine (NORVASC) 10 MG tablet Take 1 tablet by mouth daily. 02/16/21   [provider]  brimonidine (ALPHAGAN) 0.2 % ophthalmic solution Place 1 drop into both eyes in the morning and at bedtime.     [provider]  dorzolamide-timolol (COSOPT) 22.3-6.8 MG/ML ophthalmic solution Place 1 drop into both eyes 2 (two) times daily. 04/29/19   [provider]  febuxostat (ULORIC) 40 MG tablet Take 40 mg by mouth daily.    [provider]  ferrous [id number] C-folic acid (TRINSICON / FOLTRIN) capsule Take 1 capsule by mouth 2 (two) times daily after a meal. 03/24/20   Georgette Shell, MD  furosemide (LASIX) 20 MG tablet TAKE 1 TABLET(20 MG) BY MOUTH EVERY DAY FOR 1 TO 3  DAYS AS NEEDED FOR SWELLING 11/16/20   [provider]  hydrALAZINE (APRESOLINE) 50 MG tablet Take 1 tablet by mouth 2 (two) times daily. 09/02/19   [provider]  latanoprost (XALATAN) 0.005 % ophthalmic solution Place 1 drop into both eyes at bedtime. 04/28/20   [provider]  losartan (COZAAR) 50 MG tablet Take 50 mg by mouth daily. 04/28/20   [provider]  losartan (COZAAR) 50 MG tablet Take 1 tablet by mouth daily. 02/16/21   [provider]  ondansetron (ZOFRAN) 4 MG tablet  Take 1 tablet (4 mg total) by mouth every 8 (eight) hours as needed for up to 10 doses for nausea or vomiting. Patient not taking: Reported on 06/07/2021 03/01/21   Gilles Chiquito, MD  simvastatin (ZOCOR) 20 MG tablet Take 20 mg by mouth daily.    [provider]  simvastatin (ZOCOR) 20 MG tablet Take 1 tablet by mouth at bedtime. 05/12/21   [provider]  vitamin B-12 (CYANOCOBALAMIN) 1000 MCG tablet Take 1,000 mcg by mouth daily.    [provider]      VITAL SIGNS:  Blood pressure (!) 151/73, pulse 89, temperature 98.3 F (36.8 C), resp. rate 20, height 5\' 1"  (1.549 m), weight 47 kg, SpO2 95 %.  PHYSICAL EXAMINATION:  Physical Exam  GENERAL:  89 y.o.-year-old patient lying in the bed with no acute distress.  EYES: Pupils equal, round, reactive to light and accommodation. No scleral icterus. Extraocular muscles intact.  HEENT: Head atraumatic, normocephalic. Oropharynx and nasopharynx clear.  NECK:  Supple, no jugular venous distention. No thyroid enlargement, no tenderness.  LUNGS: Normal breath sounds bilaterally, no wheezing, rales,rhonchi or crepitation. No use of accessory muscles of respiration.  CARDIOVASCULAR: Regular rate and rhythm, S1, S2 normal. No murmurs, rubs, or gallops.  ABDOMEN: Soft, nondistended, nontender. Bowel sounds present. No organomegaly or mass.  EXTREMITIES: No pedal edema, cyanosis, or clubbing.  NEUROLOGIC: Cranial nerves II through XII are intact. Muscle strength 5/5 in all extremities. Sensation intact. Gait not checked.  PSYCHIATRIC: The patient is alert and oriented x 3.  Normal affect and good eye contact. SKIN: No obvious rash, lesion, or ulcer.   LABORATORY PANEL:   CBC Recent Labs  Lab 08/31/22 2009  WBC 7.3  HGB 10.5*  HCT 34.2*  PLT 217   ------------------------------------------------------------------------------------------------------------------  Chemistries  Recent Labs  Lab 08/31/22 2009  NA  140  K 6.7*  CL 111  CO2 21*  GLUCOSE 108*  BUN 51*  CREATININE 2.76*  CALCIUM 8.8*   ------------------------------------------------------------------------------------------------------------------  Cardiac Enzymes No results for input(s): "TROPONINI" in the last 168 hours. ------------------------------------------------------------------------------------------------------------------  RADIOLOGY:  DG Chest 1 View  Result Date: 08/31/2022 CLINICAL DATA:  Swelling in feet and legs and high potassium EXAM: CHEST  1 VIEW COMPARISON:  03/31/2022 FINDINGS: Stable cardiomediastinal silhouette. Sternotomy and CABG. Aortic Atherosclerosis (ICD10-I70.0). Small left pleural effusion and left basilar atelectasis/consolidation. Diffuse interstitial coarsening. No pneumothorax. IMPRESSION: Small left effusion and left basilar atelectasis or pneumonia. Diffuse interstitial coarsening may be chronic or due to edema. Electronically Signed   By: 05/31/2022 M.D.   On: 08/31/2022 20:36      IMPRESSION AND PLAN:  Assessment and Plan: No notes have been filed under this hospital service. Service: Hospitalist      DVT prophylaxis: Lovenox***  Advanced Care Planning:  Code Status: full code***  Family Communication:  The plan of care was discussed in details with the patient (and family). I answered  all questions. The patient agreed to proceed with the above mentioned plan. Further management will depend upon hospital course. Disposition Plan: Back to previous home environment Consults called: none***  All the records are reviewed and case discussed with ED provider.  Status is: Inpatient {Inpatient:23812}   At the time of the admission, it appears that the appropriate admission status for this patient is inpatient.  This is judged to be reasonable and necessary in order to provide the required intensity of service to ensure the patient's safety given the presenting symptoms, physical  exam findings and initial radiographic and laboratory data in the context of comorbid conditions.  The patient requires inpatient status due to high intensity of service, high risk of further deterioration and high frequency of surveillance required.  I certify that at the time of admission, it is my clinical judgment that the patient will require inpatient hospital care extending more than 2 midnights.                            Dispo: The patient is from: Home              Anticipated d/c is to: Home              Patient currently is not medically stable to d/c.              Difficult to place patient: No  Christel Mormon M.D on 08/31/2022 at 10:28 PM  [facility]   From 7 PM-7 AM, contact night-coverage [email or web address]  CC: Primary care physician; [place] Pink, MD

## 2022-08-31 NOTE — ED Provider Notes (Signed)
[facility] Provider Note    Event Date/Time   First MD Initiated Contact with Patient 08/31/22 2143     (approximate)   History   abnormal labs   HPI  Hayley Maynard is a 89 y.o. female   Past medical history of chronic kidney disease, arthritis, CAD, hypertension hyperlipidemia presents to the emergency department with acute kidney dysfunction elevated creatinine as well as hyperkalemia she was sent in from her primary doctor's office with abnormal labs.  She has otherwise been in her regular state of health with no acute medical complaints.  Her daughter is at bedside to offer collateral information.  States that mother has been having poor p.o. intake and prescribed medication to increase her p.o. intake by her doctor, despite this has not been eating very well.  Drinks a lot of caffeine and sugars but does not hydrate well.    Few weeks ago had swelling to bilateral legs for which her primary doctor ordered 5 days of Lasix which did not improve her swelling so she has been without taking any diuretics for the last several weeks now.    Denies any respiratory infectious symptoms, chest pain, abdominal pain nausea or vomiting or GU symptoms.    History was obtained via the patient.  Her daughter is at bedside as an independent historian to corroborate information as given above. I reviewed external medical note including internal medicine note dated 03/16/2022 where they addressed her weight loss and con't on Remeron.     Physical Exam   Triage Vital Signs: ED Triage Vitals  Enc Vitals Group     BP 08/31/22 2006 (!) 144/69     Pulse Rate 08/31/22 2006 89     Resp 08/31/22 2006 18     Temp 08/31/22 2006 98.4 F (36.9 C)     Temp Source 08/31/22 2006 Oral     SpO2 08/31/22 2006 95 %     Weight 08/31/22 2007 103 lb 9.9 oz (47 kg)     Height 08/31/22 2007 5\' 1"  (1.549 m)     Head Circumference --      Peak Flow --      Pain Score 08/31/22 2007 0      Pain Loc --      Pain Edu? --      Excl. in [place]? --     Most recent vital signs: Vitals:   08/31/22 2006  BP: (!) 144/69  Pulse: 89  Resp: 18  Temp: 98.4 F (36.9 C)  SpO2: 95%    General: Awake, no distress.  CV:  Mucous membranes are slightly dry, slightly edematous bilateral lower extremities Resp:  Normal effort.  Lungs clear without wheezing or focalities, no rales. Abd:  No distention.  Soft nontender. Other:  Awake alert oriented pleasant nontoxic-appearing   ED Results / Procedures / Treatments   Labs (all labs ordered are listed, but only abnormal results are displayed) Labs Reviewed  BASIC METABOLIC PANEL - Abnormal; Notable for the following components:      Result Value   Potassium 6.7 (*)    CO2 21 (*)    Glucose, Bld 108 (*)    BUN 51 (*)    Creatinine, Ser 2.76 (*)    Calcium 8.8 (*)    GFR, Estimated 15 (*)    All other components within normal limits  CBC - Abnormal; Notable for the following components:   RBC 3.51 (*)    Hemoglobin 10.5 (*)  HCT 34.2 (*)    RDW 17.9 (*)    All other components within normal limits  TROPONIN I (HIGH SENSITIVITY) - Abnormal; Notable for the following components:   Troponin I (High Sensitivity) 35 (*)    All other components within normal limits  GLUCOSE, RANDOM  TROPONIN I (HIGH SENSITIVITY)     I reviewed labs and they are notable for creatinine is 2.7 markedly elevated from prior, as well as hyperkalemia 6.7.  EKG  ED ECG REPORT I, Lucillie Garfinkel, the attending physician, personally viewed and interpreted this ECG.   Date: 08/31/2022  EKG Time: 2017  Rate: 86  Rhythm: This is a EKG that is markedly degraded by motion artifact but within this limitation there is no acute ischemic changes or hyperkalemic changes.    PROCEDURES:  Critical Care performed: Yes, see critical care procedure note(s)  .Critical Care  Performed by: Lucillie Garfinkel, MD Authorized by: Lucillie Garfinkel, MD   Critical care provider  statement:    Critical care time (minutes):  30   Critical care was necessary to treat or prevent imminent or life-threatening deterioration of the following conditions:  Metabolic crisis   Critical care was time spent personally by me on the following activities:  Development of treatment plan with patient or surrogate, discussions with consultants, evaluation of patient's response to treatment, examination of patient, ordering and review of laboratory studies, ordering and review of radiographic studies, ordering and performing treatments and interventions, pulse oximetry, re-evaluation of patient's condition and review of old charts    MEDICATIONS ORDERED IN ED: Medications  sodium zirconium cyclosilicate (LOKELMA) packet 10 g (has no administration in time range)  calcium gluconate 1 g/ 50 mL sodium chloride IVPB (has no administration in time range)  insulin aspart (novoLOG) injection 5 Units (has no administration in time range)    And  dextrose 50 % solution 50 mL (has no administration in time range)  sodium bicarbonate injection 50 mEq (has no administration in time range)  sodium chloride 0.9 % bolus 1,000 mL (has no administration in time range)    Consultants:  I spoke with hospitalist for admission and regarding care plan for this patient.   IMPRESSION / MDM / ASSESSMENT AND PLAN / ED COURSE  I reviewed the triage vital signs and the nursing notes.                              Differential diagnosis includes, but is not limited to, kalemia, acute renal failure, dehydration, prerenal more likely   The patient is on the cardiac monitor to evaluate for evidence of arrhythmia and/or significant heart rate changes.  MDM: This is a patient with hyperkalemia and chronic kidney disease with acute worsening renal function most likely in the setting of poor p.o. intake appears slightly dehydrated with no other acute medical complaints.  She denies chest pain or palpitations.  She is  making urine.  EKG was markedly degraded by artifact but in these limitation I cannot see any peaked T waves or widening QRS.  Will treat with calcium, insulin dextrose, bicarb, and give saline bolus 1 L.  Admission.   Patient's presentation is most consistent with acute presentation with potential threat to life or bodily function.       FINAL CLINICAL IMPRESSION(S) / ED DIAGNOSES   Final diagnoses:  AKI (acute kidney injury) ([place])  Hyperkalemia     Rx / DC Orders  ED Discharge Orders     None        Note:  This document was prepared using Dragon voice recognition software and may include unintentional dictation errors.    Pilar Jarvis, MD 08/31/22 [phone]

## 2022-09-01 ENCOUNTER — Inpatient Hospital Stay: Payer: Medicare Other

## 2022-09-01 DIAGNOSIS — E875 Hyperkalemia: Secondary | ICD-10-CM

## 2022-09-01 DIAGNOSIS — E785 Hyperlipidemia, unspecified: Secondary | ICD-10-CM | POA: Insufficient documentation

## 2022-09-01 DIAGNOSIS — I10 Essential (primary) hypertension: Secondary | ICD-10-CM

## 2022-09-01 LAB — URINALYSIS, COMPLETE (UACMP) WITH MICROSCOPIC
Bilirubin Urine: NEGATIVE
Glucose, UA: NEGATIVE mg/dL
Hgb urine dipstick: NEGATIVE
Ketones, ur: NEGATIVE mg/dL
Nitrite: NEGATIVE
Protein, ur: NEGATIVE mg/dL
Specific Gravity, Urine: 1.012 (ref 1.005–1.030)
pH: 7 (ref 5.0–8.0)

## 2022-09-01 LAB — BASIC METABOLIC PANEL
Anion gap: 9 (ref 5–15)
Anion gap: 5 (ref 5–15)
BUN: 46 mg/dL — ABNORMAL HIGH (ref 8–23)
BUN: 42 mg/dL — ABNORMAL HIGH (ref 8–23)
CO2: 18 mmol/L — ABNORMAL LOW (ref 22–32)
CO2: 22 mmol/L (ref 22–32)
Calcium: 8.4 mg/dL — ABNORMAL LOW (ref 8.9–10.3)
Calcium: 8.3 mg/dL — ABNORMAL LOW (ref 8.9–10.3)
Chloride: 112 mmol/L — ABNORMAL HIGH (ref 98–111)
Chloride: 113 mmol/L — ABNORMAL HIGH (ref 98–111)
Creatinine, Ser: 2.41 mg/dL — ABNORMAL HIGH (ref 0.44–1.00)
Creatinine, Ser: 2.1 mg/dL — ABNORMAL HIGH (ref 0.44–1.00)
GFR, Estimated: 18 mL/min — ABNORMAL LOW (ref >60)
GFR, Estimated: 21 mL/min — ABNORMAL LOW (ref >60)
Glucose, Bld: 105 mg/dL — ABNORMAL HIGH (ref 70–99)
Glucose, Bld: 99 mg/dL (ref 70–99)
Potassium: 5.8 mmol/L — ABNORMAL HIGH (ref 3.5–5.1)
Potassium: 6.1 mmol/L — ABNORMAL HIGH (ref 3.5–5.1)
Sodium: 139 mmol/L (ref 135–145)
Sodium: 140 mmol/L (ref 135–145)

## 2022-09-01 LAB — CBC
HCT: 30.8 % — ABNORMAL LOW (ref 36.0–46.0)
Hemoglobin: 9.7 g/dL — ABNORMAL LOW (ref 12.0–15.0)
MCH: 30 pg (ref 26.0–34.0)
MCHC: 31.5 g/dL (ref 30.0–36.0)
MCV: 95.4 fL (ref 80.0–100.0)
Platelets: 200 10*3/uL (ref 150–400)
RBC: 3.23 MIL/uL — ABNORMAL LOW (ref 3.87–5.11)
RDW: 17.7 % — ABNORMAL HIGH (ref 11.5–15.5)
WBC: 6.9 10*3/uL (ref 4.0–10.5)
nRBC: 0 % (ref 0.0–0.2)

## 2022-09-01 LAB — CBG MONITORING, ED
Glucose-Capillary: 49 mg/dL — ABNORMAL LOW (ref 70–99)
Glucose-Capillary: 128 mg/dL — ABNORMAL HIGH (ref 70–99)

## 2022-09-01 MED ORDER — ACETAMINOPHEN 325 MG PO TABS: 650.0000 mg | ORAL_TABLET | Freq: Four times a day (QID) | ORAL | Status: DC | PRN

## 2022-09-01 MED ORDER — MEGESTROL ACETATE 40 MG/ML PO SUSP
800.0000 mg | Freq: Every day | ORAL | Status: DC
  Administered 2022-09-01 – 2022-09-03 (×3): 800 mg via ORAL
  Filled 2022-09-01 (×3): qty 20

## 2022-09-01 MED ORDER — MAGNESIUM HYDROXIDE 400 MG/5ML PO SUSP: 30.0000 mL | Freq: Every day | ORAL | Status: DC | PRN

## 2022-09-01 MED ORDER — BRIMONIDINE TARTRATE 0.2 % OP SOLN
1.0000 [drp] | Freq: Two times a day (BID) | OPHTHALMIC | Status: DC
  Administered 2022-09-01 – 2022-09-03 (×5): 1 [drp] via OPHTHALMIC
  Filled 2022-09-01: qty 5

## 2022-09-01 MED ORDER — FEBUXOSTAT 40 MG PO TABS
40.0000 mg | ORAL_TABLET | Freq: Every day | ORAL | Status: DC
  Administered 2022-09-01 – 2022-09-02 (×2): 40 mg via ORAL
  Filled 2022-09-01 (×2): qty 1

## 2022-09-01 MED ORDER — SODIUM ZIRCONIUM CYCLOSILICATE 10 G PO PACK
10.0000 g | PACK | Freq: Two times a day (BID) | ORAL | Status: AC
  Administered 2022-09-01 (×2): 10 g via ORAL
  Filled 2022-09-01 (×2): qty 1

## 2022-09-01 MED ORDER — TRAZODONE HCL 50 MG PO TABS
25.0000 mg | ORAL_TABLET | Freq: Every evening | ORAL | Status: DC | PRN
  Administered 2022-09-01: 25 mg via ORAL
  Filled 2022-09-01: qty 1

## 2022-09-01 MED ORDER — DEXTROSE 50 % IV SOLN
INTRAVENOUS | Status: AC
  Administered 2022-09-01: 50 mL via INTRAVENOUS
  Filled 2022-09-01: qty 50

## 2022-09-01 MED ORDER — ONDANSETRON HCL 4 MG PO TABS: 4.0000 mg | ORAL_TABLET | Freq: Four times a day (QID) | ORAL | Status: DC | PRN

## 2022-09-01 MED ORDER — HYDRALAZINE HCL 50 MG PO TABS
50.0000 mg | ORAL_TABLET | Freq: Two times a day (BID) | ORAL | Status: DC
  Administered 2022-09-01 – 2022-09-03 (×5): 50 mg via ORAL
  Filled 2022-09-01 (×5): qty 1

## 2022-09-01 MED ORDER — SODIUM CHLORIDE 0.9 % IV SOLN: INTRAVENOUS | Status: DC

## 2022-09-01 MED ORDER — LATANOPROST 0.005 % OP SOLN
1.0000 [drp] | Freq: Every day | OPHTHALMIC | Status: DC
  Administered 2022-09-01 – 2022-09-02 (×2): 1 [drp] via OPHTHALMIC
  Filled 2022-09-01: qty 2.5

## 2022-09-01 MED ORDER — HEPARIN SODIUM (PORCINE) 5000 UNIT/ML IJ SOLN
5000.0000 [IU] | Freq: Three times a day (TID) | INTRAMUSCULAR | Status: DC
  Administered 2022-09-01 – 2022-09-03 (×8): 5000 [IU] via SUBCUTANEOUS
  Filled 2022-09-01 (×8): qty 1

## 2022-09-01 MED ORDER — SIMVASTATIN 20 MG PO TABS
20.0000 mg | ORAL_TABLET | Freq: Every day | ORAL | Status: DC
  Administered 2022-09-01 – 2022-09-02 (×2): 20 mg via ORAL
  Filled 2022-09-01 (×2): qty 1

## 2022-09-01 MED ORDER — AMLODIPINE BESYLATE 10 MG PO TABS
10.0000 mg | ORAL_TABLET | Freq: Every day | ORAL | Status: DC
  Administered 2022-09-01 – 2022-09-03 (×3): 10 mg via ORAL
  Filled 2022-09-01 (×3): qty 1

## 2022-09-01 MED ORDER — ACETAMINOPHEN 650 MG RE SUPP: 650.0000 mg | Freq: Four times a day (QID) | RECTAL | Status: DC | PRN

## 2022-09-01 MED ORDER — ONDANSETRON HCL 4 MG/2ML IJ SOLN: 4.0000 mg | Freq: Four times a day (QID) | INTRAMUSCULAR | Status: DC | PRN

## 2022-09-01 MED ORDER — FE FUM-VIT C-VIT B12-FA 460-60-0.01-1 MG PO CAPS
1.0000 | ORAL_CAPSULE | Freq: Every day | ORAL | Status: DC
  Administered 2022-09-01 – 2022-09-03 (×3): 1 via ORAL
  Filled 2022-09-01 (×3): qty 1

## 2022-09-01 MED ORDER — DORZOLAMIDE HCL-TIMOLOL MAL 2-0.5 % OP SOLN
1.0000 [drp] | Freq: Two times a day (BID) | OPHTHALMIC | Status: DC
  Administered 2022-09-01 – 2022-09-03 (×5): 1 [drp] via OPHTHALMIC
  Filled 2022-09-01: qty 10

## 2022-09-01 MED ORDER — VITAMIN B-12 1000 MCG PO TABS
1000.0000 ug | ORAL_TABLET | Freq: Every day | ORAL | Status: DC
  Administered 2022-09-01 – 2022-09-03 (×3): 1000 ug via ORAL
  Filled 2022-09-01 (×3): qty 1

## 2022-09-01 MED ORDER — METHYLPREDNISOLONE SODIUM SUCC 125 MG IJ SOLR
125.0000 mg | Freq: Once | INTRAMUSCULAR | Status: AC
  Administered 2022-09-01: 125 mg via INTRAVENOUS
  Filled 2022-09-01: qty 2

## 2022-09-01 MED ORDER — DEXTROSE 50 % IV SOLN
1.0000 | Freq: Once | INTRAVENOUS | Status: AC
  Administered 2022-09-01: 50 mL via INTRAVENOUS

## 2022-09-01 MED ORDER — ENOXAPARIN SODIUM 40 MG/0.4ML IJ SOSY: 40.0000 mg | PREFILLED_SYRINGE | INTRAMUSCULAR | Status: DC

## 2022-09-01 NOTE — Progress Notes (Signed)
Screening assessment not completed pt only alert to self and place. Will notify incoming shift. Will continue to monitor.

## 2022-09-01 NOTE — Hospital Course (Addendum)
Hayley Maynard is a 89 y.o. African-American female with medical history significant for Stage III chronic kidney disease, CAD, hypertension, dyslipidemia and osteoarthritis, who presented to the emergency room with acute onset of abnormal labs with elevated renal functions and potassium as checked by his PCP earlier during the day.    01/10: ED - BP was 144/69, other VSS. Labs revealed hyperkalemia of 6.7 and a BUN 51 with a creatinine of 2.76 up from 1.3 in 02/2022. CO2 is 21 and calcium 8.8. CBC stable anemia. EKG showed atrial flutter with 3-1 AV block and a rate of 95 with ventricular bigeminy, Q waves inferiorly and laterally.  Chest x-ray showed small left pleural effusion and left basilar atelectasis or pneumonia with diffuse interstitial coarsening that may be chronic or due to edema. IV calcium gluconate, an amp of D50, 5 units of IV insulin, an amp of sodium bicarbonate and 10 g of p.o. Lokelma. Admitted to hospitalist service.  01/11: Cr 2.41, K down to 5.8 this morning then to 6.1 in evening. Hgb 9.7 likely hemodilutional. UA unimpressive, awairt UCx since no UTI symptoms. Continue IV fluids. Re-dose Lokelma x2. Obtain renal US 01/12: renal US (+)medical renal disease. Cr 2.03, BUN 40, K 5.9. Asked nephrology to assess given slow improvement, concern for potential advancing CKD versus purely AKI. Recs for continue scheduled Lokelma and ok for prn Lasix 40 mg  01/13: CR 2.01, K 5.4.     Consultants:  Nephrology   Procedures: none      ASSESSMENT & PLAN:   Principal Problem:   Hyperkalemia Active Problems:   Acute kidney injury superimposed on chronic kidney disease ([place])   Essential hypertension   Dyslipidemia   Hyperkalemia - improving Likely d/t worsening renal fxn see below  Improved but not normalized, however felt safe for discharge with close outpatient follow up given consistent trend toward improvement and likely chronic new CKD4 Monitor BMP Continue Lokelma  Add  Lasix per nephrology   Acute kidney injury superimposed on chronic kidney disease ([place]) vs new CKD4  Renal US (+)medical renal disease, nonobstructing stones  UTI ruled out avoid nephrotoxins. Nephrology consult - see above and will need outpatient follow-up  Gout vs OA Pt reports gout flare w/ known history - improved  XR foot --> degnerative and demineralization changes Location classic for gout but exam unimpressive Unable to give colchicine / NSAID d/t renal impairment, stopped her home febuxostat SoluMedrol x1 and consider steroid taper  Consider ortho or podiatry consult or outpatient    Essential hypertension continue home antihypertensives.  Dyslipidemia continue home statin therapy.    DVT prophylaxis: Heparin due to AKI Pertinent IV fluids/nutrition: Normal saline 100 mL/h  Central lines / invasive devices: none  Code Status: FULL CODE confirmed with patient at bedside today on rounds   Current Admission Status: inpatient   TOC needs / Dispo plan: none at this time  Barriers to discharge / significant pending items: renal function and hyperkalemia, if still trending appropriately can hopefully discharge tomorrow morning with outpatient follow-up

## 2022-09-01 NOTE — Assessment & Plan Note (Signed)
-   We will continue her antihypertensives. 

## 2022-09-01 NOTE — Plan of Care (Signed)

## 2022-09-01 NOTE — Progress Notes (Signed)
At bedside for PIV placement.  Pt not in room.RN to place IV consult when pt in room and ready for IV.

## 2022-09-01 NOTE — Assessment & Plan Note (Signed)
-   We will hydrate with IV normal saline and follow BMP. - We will avoid nephrotoxins.

## 2022-09-01 NOTE — Assessment & Plan Note (Signed)
-   We will continue statin therapy. 

## 2022-09-01 NOTE — Progress Notes (Signed)
PROGRESS NOTE    TYSHELL RAMBERG   LGX:[id number] DOB: Jun 02, 1922  DOA: 08/31/2022 Date of Service: 09/01/22 PCP: [place] Pink, MD     Brief Narrative / Hospital Course:  OTHA RICKLES is a 89 y.o. African-American female with medical history significant for Stage III chronic kidney disease, CAD, hypertension, dyslipidemia and osteoarthritis, who presented to the emergency room with acute onset of abnormal labs with elevated renal functions and potassium as checked by his PCP earlier during the day.    01/10: ED - BP was 144/69, other VSS. Labs revealed hyperkalemia of 6.7 and a BUN of 51 with a creatinine of 2.76 up from 1.3. CO2 is 21 and calcium 8.8. CBC stable anemia. EKG showed atrial flutter with 3-1 AV block and a rate of 95 with ventricular bigeminy, Q waves inferiorly and laterally.  Chest x-ray showed small left pleural effusion and left basilar atelectasis or pneumonia with diffuse interstitial coarsening that may be chronic or due to edema. IV calcium gluconate, an amp of D50, 5 units of IV insulin, an amp of sodium bicarbonate and 10 g of p.o. Lokelma. Admitted to hospitalist service.  01/11: potassium improved down to 5.8 this morning. Hgb 9.7 likely hemodilutional. UA unimpressive, awairt UCx since no UTI symptoms. Continue IV fluids. Re-dose Lokelma x2.   Consultants:  none  Procedures: none      ASSESSMENT & PLAN:   Principal Problem:   Hyperkalemia Active Problems:   Acute kidney injury superimposed on chronic kidney disease ([place])   Essential hypertension   Dyslipidemia   Hyperkalemia Likely d/t worsening renal fxn see below  S/p treatment as above Improved but not normalized  Monitor K levels on BMP Repeat Lokelma   Acute kidney injury superimposed on chronic kidney disease ([place]) hydrate with IV normal saline and follow BMP. avoid nephrotoxins. Ordered UA/UCx --> UA trace leuks, no nitrite, 0-5 epithelial cells, 11-20 WBC and no symptoms. No obvious UTI  that would make sense for AKI cause, await culture Renal US pending  Consider nephrology consult if not improving / if worsening   Gout Pt reports flare w/ known history  Unable to give colchicine / NSAID d/t renal impairment SoluMedrol x1 and consider steroid taper  XR foot  Consider ortho consult if not improving   Essential hypertension continue home antihypertensives.  Dyslipidemia continue home statin therapy.    DVT prophylaxis: Heparin due to AKI Pertinent IV fluids/nutrition: Normal saline 100 mL/h by Central lines / invasive devices: none  Code Status: FULL CODE confirmed with patient at bedside today on rounds   Current Admission Status: inpatient   TOC needs / Dispo plan: none at this time  Barriers to discharge / significant pending items: renal function and hyperkalemia, hopefully can correct over the next 1-2 days / may need nephrology consult if not improving or if worsening.              Subjective / Brief ROS:  Patient reports foot pain concerning for gout flare on R 1st PIP Denies CP/SOB.  Pain controlled.  Denies new weakness.  Tolerating diet.  Reports no concerns w/ urination/defecation.   Family Communication: called daughter, [name], 09/01/22 2:04 PM no answer, left HIPAA compliant vm and number of patient's floor.     Objective Findings:  Vitals:   09/01/22 0200 09/01/22 0300 09/01/22 0426 09/01/22 0819  BP:  (!) 130/96 136/72 132/69  Pulse: 96 99 80 89  Resp: 16 19 18 18   Temp:   98.1  F (36.7 C) 98.5 F (36.9 C)  TempSrc:   Oral Oral  SpO2: 95% 95% 96% 93%  Weight:   58 kg   Height:   5\' 1"  (1.549 m)     Intake/Output Summary (Last 24 hours) at 09/01/2022 1403 Last data filed at 09/01/2022 1338 Gross per 24 hour  Intake 1023.05 ml  Output --  Net 1023.05 ml   Filed Weights   08/31/22 2007 09/01/22 0426  Weight: 47 kg 58 kg    Examination:  Physical Exam Constitutional:      General: She is not in acute  distress. Cardiovascular:     Rate and Rhythm: Normal rate and regular rhythm.     Heart sounds: Murmur heard.  Pulmonary:     Effort: Pulmonary effort is normal.     Breath sounds: Normal breath sounds.  Abdominal:     General: Abdomen is flat.     Palpations: Abdomen is soft.  Musculoskeletal:        General: No swelling.     Right lower leg: No edema.     Left lower leg: No edema.     Comments: Reports tenderness R 1st PIP joint, it is not red/erythematous or edematous   Neurological:     Mental Status: She is alert.          Scheduled Medications:   amLODipine  10 mg Oral Daily   brimonidine  1 drop Both Eyes BID   cyanocobalamin  1,000 mcg Oral Daily   dorzolamide-timolol  1 drop Both Eyes BID   Fe Fum-Vit C-Vit B12-FA  1 capsule Oral Q breakfast   febuxostat  40 mg Oral Daily   heparin injection (subcutaneous)  5,000 Units Subcutaneous Q8H   hydrALAZINE  50 mg Oral BID   latanoprost  1 drop Both Eyes QHS   methylPREDNISolone (SOLU-MEDROL) injection  125 mg Intravenous Once   simvastatin  20 mg Oral q1800   sodium zirconium cyclosilicate  10 g Oral BID    Continuous Infusions:  sodium chloride 100 mL/hr at 09/01/22 1338    PRN Medications:  acetaminophen **OR** acetaminophen, ondansetron **OR** ondansetron (ZOFRAN) IV, traZODone  Antimicrobials from admission:  Anti-infectives (From admission, onward)    None           Data Reviewed:  I have personally reviewed the following...  CBC: Recent Labs  Lab 08/31/22 2009 09/01/22 0453  WBC 7.3 6.9  HGB 10.5* 9.7*  HCT 34.2* 30.8*  MCV 97.4 95.4  PLT 217 200   Basic Metabolic Panel: Recent Labs  Lab 08/31/22 2009 08/31/22 2206 09/01/22 0453  NA 140  --  139  K 6.7*  --  5.8*  CL 111  --  112*  CO2 21*  --  18*  GLUCOSE 108* 96 105*  BUN 51*  --  46*  CREATININE 2.76*  --  2.41*  CALCIUM 8.8*  --  8.4*   GFR: Estimated Creatinine Clearance: 10.7 mL/min (A) (by C-G formula based on  SCr of 2.41 mg/dL (H)). Liver Function Tests: No results for input(s): "AST", "ALT", "ALKPHOS", "BILITOT", "PROT", "ALBUMIN" in the last 168 hours. No results for input(s): "LIPASE", "AMYLASE" in the last 168 hours. No results for input(s): "AMMONIA" in the last 168 hours. Coagulation Profile: No results for input(s): "INR", "PROTIME" in the last 168 hours. Cardiac Enzymes: No results for input(s): "CKTOTAL", "CKMB", "CKMBINDEX", "TROPONINI" in the last 168 hours. BNP (last 3 results) No results for input(s): "PROBNP" in the last  8760 hours. HbA1C: No results for input(s): "HGBA1C" in the last 72 hours. CBG: Recent Labs  Lab 09/01/22 0129 09/01/22 0229  GLUCAP 49* 128*   Lipid Profile: No results for input(s): "CHOL", "HDL", "LDLCALC", "TRIG", "CHOLHDL", "LDLDIRECT" in the last 72 hours. Thyroid Function Tests: No results for input(s): "TSH", "T4TOTAL", "FREET4", "T3FREE", "THYROIDAB" in the last 72 hours. Anemia Panel: No results for input(s): "VITAMINB12", "FOLATE", "FERRITIN", "TIBC", "IRON", "RETICCTPCT" in the last 72 hours. Most Recent Urinalysis On File:     Component Value Date/Time   COLORURINE YELLOW (A) 09/01/2022 0935   APPEARANCEUR CLEAR (A) 09/01/2022 0935   LABSPEC 1.012 09/01/2022 0935   PHURINE 7.0 09/01/2022 0935   GLUCOSEU NEGATIVE 09/01/2022 0935   HGBUR NEGATIVE 09/01/2022 0935   BILIRUBINUR NEGATIVE 09/01/2022 0935   KETONESUR NEGATIVE 09/01/2022 0935   PROTEINUR NEGATIVE 09/01/2022 0935   NITRITE NEGATIVE 09/01/2022 0935   LEUKOCYTESUR TRACE (A) 09/01/2022 0935   Sepsis Labs: @LABRCNTIP (procalcitonin:4,lacticidven:4) Microbiology: No results found for this or any previous visit (from the past 240 hour(s)).    Radiology Studies last 3 days: DG Chest 1 View  Result Date: 08/31/2022 CLINICAL DATA:  Swelling in feet and legs and high potassium EXAM: CHEST  1 VIEW COMPARISON:  03/31/2022 FINDINGS: Stable cardiomediastinal silhouette. Sternotomy and  CABG. Aortic Atherosclerosis (ICD10-I70.0). Small left pleural effusion and left basilar atelectasis/consolidation. Diffuse interstitial coarsening. No pneumothorax. IMPRESSION: Small left effusion and left basilar atelectasis or pneumonia. Diffuse interstitial coarsening may be chronic or due to edema. Electronically Signed   By: Placido Sou M.D.   On: 08/31/2022 20:36             LOS: 1 day     Emeterio Reeve, DO [facility] 09/01/2022, 2:03 PM    Dictation software may have been used to generate the above note. Typos may occur and escape review in typed/dictated notes. Please contact Dr Sheppard Coil directly for clarity if needed.  Staff may message me via secure chat in [place]  but this may not receive an immediate response,  please page me for urgent matters!  If 7PM-7AM, please contact night coverage [email or web address]

## 2022-09-01 NOTE — Assessment & Plan Note (Signed)
-   The patient will be admitted to a medical telemetry bed. - We will continue hydration with IV normal saline. - Will monitor potassium level.

## 2022-09-01 NOTE — ED Notes (Signed)
Placed pure wick.

## 2022-09-02 DIAGNOSIS — E875 Hyperkalemia: Secondary | ICD-10-CM | POA: Diagnosis not present

## 2022-09-02 LAB — BASIC METABOLIC PANEL
Anion gap: 4 — ABNORMAL LOW (ref 5–15)
BUN: 40 mg/dL — ABNORMAL HIGH (ref 8–23)
CO2: 20 mmol/L — ABNORMAL LOW (ref 22–32)
Calcium: 8.4 mg/dL — ABNORMAL LOW (ref 8.9–10.3)
Chloride: 115 mmol/L — ABNORMAL HIGH (ref 98–111)
Creatinine, Ser: 2.03 mg/dL — ABNORMAL HIGH (ref 0.44–1.00)
GFR, Estimated: 22 mL/min — ABNORMAL LOW (ref >60)
Glucose, Bld: 97 mg/dL (ref 70–99)
Potassium: 5.9 mmol/L — ABNORMAL HIGH (ref 3.5–5.1)
Sodium: 139 mmol/L (ref 135–145)

## 2022-09-02 LAB — URINE CULTURE: Culture: <10000 — AB

## 2022-09-02 MED ORDER — FUROSEMIDE 40 MG PO TABS
40.0000 mg | ORAL_TABLET | Freq: Every day | ORAL | Status: AC
  Administered 2022-09-02 – 2022-09-03 (×2): 40 mg via ORAL
  Filled 2022-09-02 (×2): qty 1

## 2022-09-02 MED ORDER — ENSURE ENLIVE PO LIQD
237.0000 mL | Freq: Two times a day (BID) | ORAL | Status: DC
  Administered 2022-09-02 – 2022-09-03 (×3): 237 mL via ORAL

## 2022-09-02 MED ORDER — SODIUM ZIRCONIUM CYCLOSILICATE 10 G PO PACK
10.0000 g | PACK | Freq: Every day | ORAL | Status: DC
  Administered 2022-09-02 – 2022-09-03 (×2): 10 g via ORAL
  Filled 2022-09-02 (×2): qty 1

## 2022-09-02 NOTE — Care Management Important Message (Signed)
Important Message  Patient Details  Name: MOSELLE RISTER MRN: [id number] Date of Birth: [birth year]   Medicare Important Message Given:  N/A - LOS <3 / Initial given by admissions     Dannette Barbara 09/02/2022, 11:19 AM

## 2022-09-02 NOTE — Plan of Care (Signed)
  Problem: Health Behavior/Discharge Planning: Goal: Ability to manage health-related needs will improve Outcome: Progressing

## 2022-09-02 NOTE — Progress Notes (Addendum)
PROGRESS NOTE    Hayley Maynard   JME:[id number] DOB: 12/27/1921  DOA: 08/31/2022 Date of Service: 09/02/22 PCP: [place] Pink, MD     Brief Narrative / Hospital Course:  Hayley Maynard is a 89 y.o. African-American female with medical history significant for Stage III chronic kidney disease, CAD, hypertension, dyslipidemia and osteoarthritis, who presented to the emergency room with acute onset of abnormal labs with elevated renal functions and potassium as checked by his PCP earlier during the day.    01/10: ED - BP was 144/69, other VSS. Labs revealed hyperkalemia of 6.7 and a BUN 51 with a creatinine of 2.76 up from 1.3 in 02/2022. CO2 is 21 and calcium 8.8. CBC stable anemia. EKG showed atrial flutter with 3-1 AV block and a rate of 95 with ventricular bigeminy, Q waves inferiorly and laterally.  Chest x-ray showed small left pleural effusion and left basilar atelectasis or pneumonia with diffuse interstitial coarsening that may be chronic or due to edema. IV calcium gluconate, an amp of D50, 5 units of IV insulin, an amp of sodium bicarbonate and 10 g of p.o. Lokelma. Admitted to hospitalist service.  01/11: Cr 2.41, K down to 5.8 this morning then to 6.1 in evening. Hgb 9.7 likely hemodilutional. UA unimpressive, awairt UCx since no UTI symptoms. Continue IV fluids. Re-dose Lokelma x2. Obtain renal US 01/12: renal US (+)medical renal disease. Cr 2.03, BUN 40, K 5.9. Asked nephrology to assess given slow improvement, concern for potential advancing CKD versus purely AKI. Recs for continue scheduled Lokelma and ok for prn Lasix 40 mg     Consultants:  none  Procedures: none      ASSESSMENT & PLAN:   Principal Problem:   Hyperkalemia Active Problems:   Acute kidney injury superimposed on chronic kidney disease ([place])   Essential hypertension   Dyslipidemia   Hyperkalemia Likely d/t worsening renal fxn see below  S/p treatment as above Improved but not normalized  Monitor K  levels on BMP Continue Lokelma  Add Lasix per nephrology   Acute kidney injury superimposed on chronic kidney disease ([place]) Renal US (+)medical renal disease, nonobstructing stones  UTI ruled out hydrate with IV normal saline and follow BMP --> d/c fluids today per nephro  avoid nephrotoxins. Nephrology consult today -will need outpatient follow-up  Gout vs OA Pt reports gout flare w/ known history  XR foot --> degnerative and demineralization changes Location classic for gout but exam unimpressive Unable to give colchicine / NSAID d/t renal impairment SoluMedrol x1 and consider steroid taper  Consider ortho or podiatry consult or outpatient followup based on clinical course    Essential hypertension continue home antihypertensives.  Dyslipidemia continue home statin therapy.    DVT prophylaxis: Heparin due to AKI Pertinent IV fluids/nutrition: Normal saline 100 mL/h  Central lines / invasive devices: none  Code Status: FULL CODE confirmed with patient at bedside today on rounds   Current Admission Status: inpatient   TOC needs / Dispo plan: none at this time  Barriers to discharge / significant pending items: renal function and hyperkalemia, if still trending appropriately can hopefully discharge tomorrow morning with outpatient follow-up             Subjective / Brief ROS:  Patient reports foot pain  Denies CP/SOB.  Pain controlled.  Denies new weakness.  Tolerating diet.  Reports no concerns w/ urination/defecation.   Family Communication: Daughter is at bedside on rounds    Objective Findings:  Vitals:  09/01/22 1930 09/02/22 0314 09/02/22 0858 09/02/22 1540  BP: (!) 117/59 133/75 137/87 (!) 109/97  Pulse: 76 87 87 70  Resp: 18 20 17 17   Temp: 98.2 F (36.8 C) 98 F (36.7 C) 98.2 F (36.8 C) 98.4 F (36.9 C)  TempSrc: Oral   Oral  SpO2: 100% 93% 93% 95%  Weight:      Height:        Intake/Output Summary (Last 24 hours) at 09/02/2022  1614 Last data filed at 09/02/2022 1144 Gross per 24 hour  Intake 1584.58 ml  Output 700 ml  Net 884.58 ml   Filed Weights   08/31/22 2007 09/01/22 0426  Weight: 47 kg 58 kg    Examination:  Physical Exam Constitutional:      General: She is not in acute distress. Cardiovascular:     Rate and Rhythm: Normal rate and regular rhythm.     Heart sounds: Murmur heard.  Pulmonary:     Effort: Pulmonary effort is normal.     Breath sounds: Normal breath sounds.  Abdominal:     General: Abdomen is flat.     Palpations: Abdomen is soft.  Musculoskeletal:        General: No swelling.     Right lower leg: Edema present.     Left lower leg: Edema present.     Comments: Reports tenderness R 1st PIP joint, it is not red/erythematous or edematous   Neurological:     Mental Status: She is alert.          Scheduled Medications:   amLODipine  10 mg Oral Daily   brimonidine  1 drop Both Eyes BID   cyanocobalamin  1,000 mcg Oral Daily   dorzolamide-timolol  1 drop Both Eyes BID   Fe Fum-Vit C-Vit B12-FA  1 capsule Oral Q breakfast   feeding supplement  237 mL Oral BID BM   furosemide  40 mg Oral Daily   heparin injection (subcutaneous)  5,000 Units Subcutaneous Q8H   hydrALAZINE  50 mg Oral BID   latanoprost  1 drop Both Eyes QHS   megestrol  800 mg Oral Daily   simvastatin  20 mg Oral q1800   sodium zirconium cyclosilicate  10 g Oral Daily    Continuous Infusions:    PRN Medications:  acetaminophen **OR** acetaminophen, ondansetron **OR** ondansetron (ZOFRAN) IV, traZODone  Antimicrobials from admission:  Anti-infectives (From admission, onward)    None           Data Reviewed:  I have personally reviewed the following...  CBC: Recent Labs  Lab 08/31/22 2009 09/01/22 0453  WBC 7.3 6.9  HGB 10.5* 9.7*  HCT 34.2* 30.8*  MCV 97.4 95.4  PLT 217 160   Basic Metabolic Panel: Recent Labs  Lab 08/31/22 2009 08/31/22 2206 09/01/22 0453 09/01/22 1919  09/02/22 0256  NA 140  --  139 140 139  K 6.7*  --  5.8* 6.1* 5.9*  CL 111  --  112* 113* 115*  CO2 21*  --  18* 22 20*  GLUCOSE 108* 96 105* 99 97  BUN 51*  --  46* 42* 40*  CREATININE 2.76*  --  2.41* 2.10* 2.03*  CALCIUM 8.8*  --  8.4* 8.3* 8.4*   GFR: Estimated Creatinine Clearance: 12.7 mL/min (A) (by C-G formula based on SCr of 2.03 mg/dL (H)). Liver Function Tests: No results for input(s): "AST", "ALT", "ALKPHOS", "BILITOT", "PROT", "ALBUMIN" in the last 168 hours. No results for input(s): "LIPASE", "  AMYLASE" in the last 168 hours. No results for input(s): "AMMONIA" in the last 168 hours. Coagulation Profile: No results for input(s): "INR", "PROTIME" in the last 168 hours. Cardiac Enzymes: No results for input(s): "CKTOTAL", "CKMB", "CKMBINDEX", "TROPONINI" in the last 168 hours. BNP (last 3 results) No results for input(s): "PROBNP" in the last 8760 hours. HbA1C: No results for input(s): "HGBA1C" in the last 72 hours. CBG: Recent Labs  Lab 09/01/22 0129 09/01/22 0229  GLUCAP 49* 128*   Lipid Profile: No results for input(s): "CHOL", "HDL", "LDLCALC", "TRIG", "CHOLHDL", "LDLDIRECT" in the last 72 hours. Thyroid Function Tests: No results for input(s): "TSH", "T4TOTAL", "FREET4", "T3FREE", "THYROIDAB" in the last 72 hours. Anemia Panel: No results for input(s): "VITAMINB12", "FOLATE", "FERRITIN", "TIBC", "IRON", "RETICCTPCT" in the last 72 hours. Most Recent Urinalysis On File:     Component Value Date/Time   COLORURINE YELLOW (A) 09/01/2022 0935   APPEARANCEUR CLEAR (A) 09/01/2022 0935   LABSPEC 1.012 09/01/2022 0935   PHURINE 7.0 09/01/2022 0935   GLUCOSEU NEGATIVE 09/01/2022 0935   HGBUR NEGATIVE 09/01/2022 0935   BILIRUBINUR NEGATIVE 09/01/2022 0935   KETONESUR NEGATIVE 09/01/2022 0935   PROTEINUR NEGATIVE 09/01/2022 0935   NITRITE NEGATIVE 09/01/2022 0935   LEUKOCYTESUR TRACE (A) 09/01/2022 0935   Sepsis  Labs: @LABRCNTIP (procalcitonin:4,lacticidven:4) Microbiology: Recent Results (from the past 240 hour(s))  Urine Culture     Status: Abnormal   Collection Time: 09/01/22  9:35 AM   Specimen: Urine, Clean Catch  Result Value Ref Range Status   Specimen Description   Final    URINE, CLEAN CATCH Performed at [facility], [address], [place], [place]    Special Requests   Final    NONE Performed at [facility], [address] [place], [place]    Culture (A)  Final    <10,000 COLONIES/mL INSIGNIFICANT GROWTH Performed at [facility], [address] [place], [place]    Report Status 09/02/2022 FINAL  Final      Radiology Studies last 3 days: DG Foot 2 Views Right  Result Date: 09/01/2022 CLINICAL DATA:  Gout, foot pain EXAM: RIGHT FOOT - 2 VIEW COMPARISON:  None Available. FINDINGS: Severe bony demineralization. Hammertoe deformities of the second through fifth digits causing bony superimposition and difficulty assessing the phalanges. Hallux valgus. Medial bony bunion. Probable pes planus. Ill definition of the subtalar joints may be projectional, there is substantial overlap of the talus and calcaneus on the lateral view attempt. Degenerative findings at the Lisfranc joint. No definite fracture is identified, although hindfoot assessment is very limited. IMPRESSION: 1. Severe bony demineralization. 2. Hallux valgus with medial bony bunion. 3. Hammertoe deformities of the second through fifth digits. 4. Degenerative findings at the Lisfranc joint. 5. Ill definition of the subtalar joints may be projectional, although hindfoot assessment is very limited on the lateral view attempt. Electronically Signed   By: 10/31/2022 M.D.   On: 09/01/2022 16:48   10/31/2022 RENAL  Result Date: 09/01/2022 CLINICAL DATA:  Acute kidney injury EXAM: RENAL / URINARY TRACT ULTRASOUND COMPLETE COMPARISON:  None Available. FINDINGS: Right Kidney:  Renal measurements: 9.7 x 4.3 x 3.3 cm = volume: 73.1 mL. No hydronephrosis. Increased renal cortical echogenicity with cortical thinning. There are multiple simple renal cysts, largest measuring up to 5.1 cm. These require no follow-up imaging. Left Kidney: Renal measurements: 9.5 x 3.8 x 3.9 cm = volume: 64.4 mL. There are multiple simple cysts, largest measuring up to  2 1.4 cm. There is a 5 mm shadowing renal calculus in the lower pole. No hydronephrosis. There is increased renal cortical echogenicity. Bladder: Appears normal for degree of bladder distention. Other: None. IMPRESSION: No hydronephrosis. Increased renal cortical echogenicity bilaterally with cortical thinning on the right, consistent with chronic medical renal disease. Multiple simple renal cysts bilaterally, which require no follow-up imaging. Nonobstructive 5 mm stone in the lower pole of the left kidney. Electronically Signed   By: Caprice Renshaw M.D.   On: 09/01/2022 16:37   DG Chest 1 View  Result Date: 08/31/2022 CLINICAL DATA:  Swelling in feet and legs and high potassium EXAM: CHEST  1 VIEW COMPARISON:  03/31/2022 FINDINGS: Stable cardiomediastinal silhouette. Sternotomy and CABG. Aortic Atherosclerosis (ICD10-I70.0). Small left pleural effusion and left basilar atelectasis/consolidation. Diffuse interstitial coarsening. No pneumothorax. IMPRESSION: Small left effusion and left basilar atelectasis or pneumonia. Diffuse interstitial coarsening may be chronic or due to edema. Electronically Signed   By: Minerva Fester M.D.   On: 08/31/2022 20:36             LOS: 2 days     Sunnie Nielsen, DO [facility] 09/02/2022, 4:14 PM    Dictation software may have been used to generate the above note. Typos may occur and escape review in typed/dictated notes. Please contact Dr Lyn Hollingshead directly for clarity if needed.  Staff may message me via secure chat in Epic  but this may not receive an immediate response,   please page me for urgent matters!  If 7PM-7AM, please contact night coverage [email or web address]

## 2022-09-02 NOTE — TOC Initial Note (Signed)
Transition of Care [facility]) - Initial/Assessment Note    Patient Details  Name: Hayley Maynard MRN: [id number] Date of Birth: April 15, 1922  Transition of Care [facility]) CM/SW Contact:    Laurena Slimmer, RN Phone Number: 09/02/2022, 9:37 AM  Clinical Narrative:                  Transition of Care [facility]) Screening Note   Patient Details  Name: Hayley Maynard Date of Birth: [birth year]   Transition of Care [facility]) CM/SW Contact:    Laurena Slimmer, RN Phone Number: 09/02/2022, 9:37 AM    Transition of Care Department [facility]) has reviewed patient and no TOC needs have been identified at this time. We will continue to monitor patient advancement through interdisciplinary progression rounds. If new patient transition needs arise, please place a TOC consult.          Patient Goals and CMS Choice            Expected Discharge Plan and Services                                              Prior Living Arrangements/Services                       Activities of Daily Living      Permission Sought/Granted                  Emotional Assessment              Admission diagnosis:  Hyperkalemia [E87.5] AKI (acute kidney injury) ([place]) [N17.9] Acute kidney injury superimposed on chronic kidney disease ([place]) [N17.9, N18.9] Patient Active Problem List   Diagnosis Date Noted   Hyperkalemia 09/01/2022   Essential hypertension 09/01/2022   Dyslipidemia 09/01/2022   Acute kidney injury superimposed on chronic kidney disease ([place]) 08/31/2022   Atherosclerosis of native arteries of extremity with intermittent claudication ([place]) 12/06/2021   Acute CVA (cerebrovascular accident) ([place]) 03/21/2021   Acute cholecystitis 03/02/2021   Chronic venous insufficiency 11/26/2020   Lymphedema 11/26/2020   Pain and swelling of lower leg 11/26/2020   Hammer toes, bilateral 11/09/2020   Displaced fracture of greater trochanter of right femur, initial encounter for closed fracture  ([place]) 03/20/2020   CAP (community acquired pneumonia) 03/20/2020   Bilateral carotid artery stenosis 02/19/2020   Chronic kidney disease, stage III (moderate) ([place]) 10/28/2019   Anemia 11/08/2018   CAD (coronary artery disease) 11/08/2018   Mixed hyperlipidemia 11/08/2018   Moderate mitral insufficiency 03/30/2017   CKD (chronic kidney disease) stage 3, GFR 30-59 ml/min 01/27/2015   LVH (left ventricular hypertrophy) due to hypertensive disease, without heart failure 01/27/2015   Benign essential hypertension 01/09/2015   PCP:  [place] Pink, MD Pharmacy:   [facility] [place], [address] [place] [address] Phone: [phone] Fax: [phone]     Social Determinants of Health (SDOH) Social History: SDOH Screenings   Tobacco Use: Medium Risk (08/31/2022)   SDOH Interventions:     Readmission Risk Interventions     No data to display

## 2022-09-03 DIAGNOSIS — E875 Hyperkalemia: Secondary | ICD-10-CM | POA: Diagnosis not present

## 2022-09-03 LAB — BASIC METABOLIC PANEL
Anion gap: 8 (ref 5–15)
BUN: 40 mg/dL — ABNORMAL HIGH (ref 8–23)
CO2: 20 mmol/L — ABNORMAL LOW (ref 22–32)
Calcium: 9 mg/dL (ref 8.9–10.3)
Chloride: 112 mmol/L — ABNORMAL HIGH (ref 98–111)
Creatinine, Ser: 2.01 mg/dL — ABNORMAL HIGH (ref 0.44–1.00)
GFR, Estimated: 22 mL/min — ABNORMAL LOW (ref >60)
Glucose, Bld: 87 mg/dL (ref 70–99)
Potassium: 5.4 mmol/L — ABNORMAL HIGH (ref 3.5–5.1)
Sodium: 140 mmol/L (ref 135–145)

## 2022-09-03 MED ORDER — SODIUM ZIRCONIUM CYCLOSILICATE 10 G PO PACK: 10.0000 g | PACK | Freq: Every day | ORAL | 0 refills | Status: DC

## 2022-09-03 MED ORDER — ENSURE ENLIVE PO LIQD: 237.0000 mL | Freq: Two times a day (BID) | ORAL | 12 refills | Status: DC

## 2022-09-03 MED ORDER — ACETAMINOPHEN 325 MG PO TABS: 352.0000 mg | ORAL_TABLET | Freq: Four times a day (QID) | ORAL | 0 refills | Status: DC | PRN

## 2022-09-03 NOTE — Plan of Care (Addendum)
AVS reviewed with the pt and her daughter [name]. Family states understanding and no questions.   Problem: Education: Goal: Knowledge of General Education information will improve Description: Including pain rating scale, medication(s)/side effects and non-pharmacologic comfort measures Outcome: Adequate for Discharge   Problem: Health Behavior/Discharge Planning: Goal: Ability to manage health-related needs will improve Outcome: Adequate for Discharge   Problem: Clinical Measurements: Goal: Ability to maintain clinical measurements within normal limits will improve Outcome: Adequate for Discharge Goal: Will remain free from infection Outcome: Adequate for Discharge Goal: Diagnostic test results will improve Outcome: Adequate for Discharge Goal: Respiratory complications will improve Outcome: Adequate for Discharge Goal: Cardiovascular complication will be avoided Outcome: Adequate for Discharge   Problem: Activity: Goal: Risk for activity intolerance will decrease Outcome: Adequate for Discharge   Problem: Nutrition: Goal: Adequate nutrition will be maintained Outcome: Adequate for Discharge   Problem: Coping: Goal: Level of anxiety will decrease Outcome: Adequate for Discharge   Problem: Elimination: Goal: Will not experience complications related to bowel motility Outcome: Adequate for Discharge Goal: Will not experience complications related to urinary retention Outcome: Adequate for Discharge   Problem: Pain Managment: Goal: General experience of comfort will improve Outcome: Adequate for Discharge   Problem: Safety: Goal: Ability to remain free from injury will improve Outcome: Adequate for Discharge   Problem: Skin Integrity: Goal: Risk for impaired skin integrity will decrease Outcome: Adequate for Discharge

## 2022-09-03 NOTE — TOC Transition Note (Addendum)
Transition of Care [facility]) - CM/SW Discharge Note   Patient Details  Name: Hayley Maynard MRN: [id number] Date of Birth: 05-18-1922  Transition of [facility]) CM/SW Contact:  Rebekah [place], LCSW Phone Number: 09/03/2022, 1:59 PM   Clinical Narrative:    CSW met with patient and adult daughter, [name] at bedside. CSW introduced role and explained that therapy recommendations would be discussed. Family is agreeable to home health. CSW reached out to multiple agencies, who declined services due to ineligibility or could not serve [name], where pt resides. Lennette Bihari with [facility] [phone] agreed to obtain insurance authorization on 09/12/22 to obtain eligibility.   CSW provided family with CMS scores for agencies that serve her zip code and [name] agreed to contact agencies as early as Monday, 09/12/22. Attending provider notified of update. Pt has a walker and two wheelchairs in the home.   No further concerns. CSW also encouraged family to contact PCP for any recommendation/referral needs to obtain HHPT services. Daughter verbalized understanding and reports no additional needs.    Final next level of care: Home/Self Care     Patient Goals and CMS Choice      Discharge Placement                         Discharge Plan and Services Additional resources added to the After Visit Summary for                                       Social Determinants of Health (SDOH) Interventions SDOH Screenings   Tobacco Use: Medium Risk (08/31/2022)     Readmission Risk Interventions     No data to display

## 2022-09-03 NOTE — Consult Note (Signed)
[facility] [place] [facility]  CONSULT NOTE    Date: 09/03/2022                  Patient Name:  Hayley Maynard  MRN: [id number]  DOB: 1922/02/23  Age / Sex: 89 y.o., female         PCP: Maryland Pink, MD                 Service Requesting Consult: [place]                 Reason for Consult: Acute kidney injury with hyperkalemia            History of Present Illness: Hayley Maynard is a 89 y.o.  female with past medical history including CAD, dyslipidemia, osteoarthritis, hypertension, and chronic kidney disease stage III, who was admitted to [facility] on 08/31/2022 for Hyperkalemia [E87.5] AKI (acute kidney injury) ([place]) [N17.9] Acute kidney injury superimposed on chronic kidney disease ([place]) [N17.9, N18.9]  Patient presents to the emergency department at the advice of their primary care physician due to abnormal labs.  It was noted patient has a elevated potassium level along with swelling in her lower extremities.  Patient seen resting quietly in bed, family friend at bedside.  Eyes remain closed, patient moans to painful stimuli.  Room air.  Legs currently wrapped with gauze and Coban.  Labs on ED arrival include potassium 6.7, BUN 51, creatinine 2.76 with GFR 15, troponin 35, and hemoglobin 10.5.  Chest x-ray shows left effusion with basilar atelectasis versus pneumonia.  UA appears clear with some bacteria.  Renal ultrasound negative for obstruction.  Does show multiple simple renal cyst bilaterally.  Elevated potassium treated with Lokelma with some response.  Medications: Outpatient medications: Medications Prior to Admission  Medication Sig Dispense Refill Last Dose   amLODipine (NORVASC) 10 MG tablet Take 10 mg by mouth daily.   08/31/2022   brimonidine (ALPHAGAN) 0.2 % ophthalmic solution Place 1 drop into both eyes in the morning and at bedtime.    08/31/2022   dorzolamide-timolol (COSOPT) 22.3-6.8 MG/ML ophthalmic solution Place 1 drop into both eyes 2 (two) times daily.    08/31/2022   febuxostat (ULORIC) 40 MG tablet Take 40 mg by mouth daily.   08/31/2022   hydrALAZINE (APRESOLINE) 50 MG tablet Take 1 tablet by mouth 2 (two) times daily.   08/31/2022   latanoprost (XALATAN) 0.005 % ophthalmic solution Place 1 drop into both eyes at bedtime.   08/30/2022   losartan (COZAAR) 50 MG tablet Take 1 tablet by mouth daily.   08/31/2022   megestrol (MEGACE) 40 MG/ML suspension Take 800 mg by mouth daily.   08/31/2022   simvastatin (ZOCOR) 20 MG tablet Take 20 mg by mouth daily.   08/31/2022   vitamin B-12 (CYANOCOBALAMIN) 1000 MCG tablet Take 1,000 mcg by mouth daily.   08/31/2022   ferrous [id number] C-folic acid (TRINSICON / FOLTRIN) capsule Take 1 capsule by mouth 2 (two) times daily after a meal. (Patient not taking: Reported on 09/01/2022)   Not Taking   furosemide (LASIX) 20 MG tablet TAKE 1 TABLET(20 MG) BY MOUTH EVERY DAY FOR 1 TO 3 DAYS AS NEEDED FOR SWELLING (Patient not taking: Reported on 09/01/2022)   Not Taking    Current medications: Current Facility-Administered Medications  Medication Dose Route Frequency Provider Last Rate Last Admin   acetaminophen (TYLENOL) tablet 650 mg  650 mg Oral Q6H PRN Mansy, Arvella Merles, MD  Or   acetaminophen (TYLENOL) suppository 650 mg  650 mg Rectal Q6H PRN Mansy, Jan A, MD       amLODipine (NORVASC) tablet 10 mg  10 mg Oral Daily Mansy, Jan A, MD   10 mg at 09/03/22 0811   brimonidine (ALPHAGAN) 0.2 % ophthalmic solution 1 drop  1 drop Both Eyes BID Mansy, Jan A, MD   1 drop at 09/03/22 0810   cyanocobalamin (VITAMIN B12) tablet 1,000 mcg  1,000 mcg Oral Daily Mansy, Jan A, MD   1,000 mcg at 09/03/22 0811   dorzolamide-timolol (COSOPT) 2-0.5 % ophthalmic solution 1 drop  1 drop Both Eyes BID Mansy, Jan A, MD   1 drop at 09/03/22 0810   Fe Fum-Vit C-Vit B12-FA (TRIGELS-F FORTE) capsule 1 capsule  1 capsule Oral Q breakfast Mansy, Jan A, MD   1 capsule at 09/03/22 0812   feeding supplement (ENSURE ENLIVE / ENSURE PLUS)  liquid 237 mL  237 mL Oral BID BM Sunnie Nielsen, DO   237 mL at 09/02/22 1326   heparin injection 5,000 Units  5,000 Units Subcutaneous Q8H Otelia Sergeant, RPH   5,000 Units at 09/03/22 0509   hydrALAZINE (APRESOLINE) tablet 50 mg  50 mg Oral BID Mansy, Jan A, MD   50 mg at 09/03/22 0811   latanoprost (XALATAN) 0.005 % ophthalmic solution 1 drop  1 drop Both Eyes QHS Mansy, Jan A, MD   1 drop at 09/02/22 2149   megestrol (MEGACE) 40 MG/ML suspension 800 mg  800 mg Oral Daily Sunnie Nielsen, DO   800 mg at 09/03/22 0812   ondansetron (ZOFRAN) tablet 4 mg  4 mg Oral Q6H PRN Mansy, Jan A, MD       Or   ondansetron [facility]) injection 4 mg  4 mg Intravenous Q6H PRN Mansy, Jan A, MD       simvastatin (ZOCOR) tablet 20 mg  20 mg Oral q1800 Mansy, Jan A, MD   20 mg at 09/02/22 1805   sodium zirconium cyclosilicate (LOKELMA) packet 10 g  10 g Oral Daily Sunnie Nielsen, DO   10 g at 09/03/22 0811   traZODone (DESYREL) tablet 25 mg  25 mg Oral QHS PRN Mansy, Jan A, MD   25 mg at 09/01/22 2140      Allergies: Allergies  Allergen Reactions   Propoxyphene Other (See Comments)   Telmisartan-Hctz Other (See Comments)      Past Medical History: Past Medical History:  Diagnosis Date   Arthritis    feet   CKD (chronic kidney disease), stage III (HCC)    Coronary artery disease    Glaucoma    Hyperlipidemia    Hypertension    LVH (left ventricular hypertrophy)    Wears dentures    full upper and lower     Past Surgical History: Past Surgical History:  Procedure Laterality Date   ABDOMINAL HYSTERECTOMY     CATARACT EXTRACTION W/ INTRAOCULAR LENS  IMPLANT, BILATERAL     CORONARY ARTERY BYPASS GRAFT     over 10 yrs ago (per pt)   INTRAMEDULLARY (IM) NAIL INTERTROCHANTERIC Right 03/20/2020   Procedure: INTRAMEDULLARY (IM) NAIL INTERTROCHANTRIC;  Surgeon: Kennedy Bucker, MD;  Location: [facility] OR[facility];  Service: Orthopedics;  Laterality: Right;   PHOTOCOAGULATION WITH LASER Right 09/26/2018    Procedure: PHOTOCOAGULATION WITH LASER;  Surgeon: Lockie Mola, MD;  Location: [facility];  Service: Ophthalmology;  Laterality: Right;  laser settings: , 31.3% duty cycle, 120 seconds  Family History: History reviewed. No pertinent family history.   Social History: Social History   Socioeconomic History   Marital status: Widowed    Spouse name: Not on file   Number of children: Not on file   Years of education: Not on file   Highest education level: Not on file  Occupational History   Not on file  Tobacco Use   Smoking status: Former    Types: Cigarettes    Quit date: 02/20/2020    Years since quitting: 2.5   Smokeless tobacco: Never   Tobacco comments:    1 pack last about 6 weeks  Vaping Use   Vaping Use: Never used  Substance and Sexual Activity   Alcohol use: Not Currently   Drug use: Not Currently   Sexual activity: Not on file  Other Topics Concern   Not on file  Social History Narrative   Not on file   Social Determinants of Health   Financial Resource Strain: Not on file  Food Insecurity: Not on file  Transportation Needs: Not on file  Physical Activity: Not on file  Stress: Not on file  Social Connections: Not on file  Intimate Partner Violence: Not on file     Review of Systems: Review of Systems  Unable to perform ROS: Mental status change    Vital Signs: Blood pressure (!) 153/90, pulse 92, temperature (!) 97.5 F (36.4 C), temperature source Oral, resp. rate 20, height 5\' 1"  (1.549 m), weight 58 kg, SpO2 (!) 88 %.  Weight trends: Filed Weights   08/31/22 2007 09/01/22 0426  Weight: 47 kg 58 kg    Physical Exam: General: NAD  Head: Normocephalic, atraumatic. Moist oral mucosal membranes  Eyes: Anicteric  Lungs:  Clear to auscultation, normal effort, room air  Heart: Regular rate and rhythm  Abdomen:  Soft, nontender  Extremities:  Trace peripheral edema.  Neurologic: Responsive to pain, moving all four  extremities  Skin: No lesions  Access: None     Lab results: Basic Metabolic Panel: Recent Labs  Lab 09/01/22 1919 09/02/22 0256 09/03/22 0707  NA 140 139 140  K 6.1* 5.9* 5.4*  CL 113* 115* 112*  CO2 22 20* 20*  GLUCOSE 99 97 87  BUN 42* 40* 40*  CREATININE 2.10* 2.03* 2.01*  CALCIUM 8.3* 8.4* 9.0    Liver Function Tests: No results for input(s): "AST", "ALT", "ALKPHOS", "BILITOT", "PROT", "ALBUMIN" in the last 168 hours. No results for input(s): "LIPASE", "AMYLASE" in the last 168 hours. No results for input(s): "AMMONIA" in the last 168 hours.  CBC: Recent Labs  Lab 08/31/22 2009 09/01/22 0453  WBC 7.3 6.9  HGB 10.5* 9.7*  HCT 34.2* 30.8*  MCV 97.4 95.4  PLT 217 200    Cardiac Enzymes: No results for input(s): "CKTOTAL", "CKMB", "CKMBINDEX", "TROPONINI" in the last 168 hours.  BNP: Invalid input(s): "POCBNP"  CBG: Recent Labs  Lab 09/01/22 0129 09/01/22 0229  GLUCAP 49* 128*    Microbiology: Results for orders placed or performed during the hospital encounter of 08/31/22  Urine Culture     Status: Abnormal   Collection Time: 09/01/22  9:35 AM   Specimen: Urine, Clean Catch  Result Value Ref Range Status   Specimen Description   Final    URINE, CLEAN CATCH Performed at [facility], [address], [place], [place]    Special Requests   Final    NONE Performed at [facility], [address] [place],  [place] [address]    Culture (A)  Final    <10,000 COLONIES/mL INSIGNIFICANT GROWTH Performed at [facility], [address], [place], [place] [address]    Report Status 09/02/2022 FINAL  Final    Coagulation Studies: No results for input(s): "LABPROT", "INR" in the last 72 hours.  Urinalysis: Recent Labs    09/01/22 0935  COLORURINE YELLOW*  LABSPEC 1.012  PHURINE 7.0  GLUCOSEU NEGATIVE  HGBUR NEGATIVE  BILIRUBINUR NEGATIVE  KETONESUR NEGATIVE  PROTEINUR NEGATIVE  NITRITE NEGATIVE   LEUKOCYTESUR TRACE*      Imaging: DG Foot 2 Views Right  Result Date: 09/01/2022 CLINICAL DATA:  Gout, foot pain EXAM: RIGHT FOOT - 2 VIEW COMPARISON:  None Available. FINDINGS: Severe bony demineralization. Hammertoe deformities of the second through fifth digits causing bony superimposition and difficulty assessing the phalanges. Hallux valgus. Medial bony bunion. Probable pes planus. Ill definition of the subtalar joints may be projectional, there is substantial overlap of the talus and calcaneus on the lateral view attempt. Degenerative findings at the Lisfranc joint. No definite fracture is identified, although hindfoot assessment is very limited. IMPRESSION: 1. Severe bony demineralization. 2. Hallux valgus with medial bony bunion. 3. Hammertoe deformities of the second through fifth digits. 4. Degenerative findings at the Lisfranc joint. 5. Ill definition of the subtalar joints may be projectional, although hindfoot assessment is very limited on the lateral view attempt. Electronically Signed   By: Gaylyn Rong M.D.   On: 09/01/2022 16:48   US RENAL  Result Date: 09/01/2022 CLINICAL DATA:  Acute kidney injury EXAM: RENAL / URINARY TRACT ULTRASOUND COMPLETE COMPARISON:  None Available. FINDINGS: Right Kidney: Renal measurements: 9.7 x 4.3 x 3.3 cm = volume: 73.1 mL. No hydronephrosis. Increased renal cortical echogenicity with cortical thinning. There are multiple simple renal cysts, largest measuring up to 5.1 cm. These require no follow-up imaging. Left Kidney: Renal measurements: 9.5 x 3.8 x 3.9 cm = volume: 64.4 mL. There are multiple simple cysts, largest measuring up to 2 1.4 cm. There is a 5 mm shadowing renal calculus in the lower pole. No hydronephrosis. There is increased renal cortical echogenicity. Bladder: Appears normal for degree of bladder distention. Other: None. IMPRESSION: No hydronephrosis. Increased renal cortical echogenicity bilaterally with cortical thinning on the  right, consistent with chronic medical renal disease. Multiple simple renal cysts bilaterally, which require no follow-up imaging. Nonobstructive 5 mm stone in the lower pole of the left kidney. Electronically Signed   By: Caprice Renshaw M.D.   On: 09/01/2022 16:37     Assessment & Plan: Hayley Maynard is a 89 y.o.  female with past medical history including CAD, dyslipidemia, osteoarthritis, hypertension, and chronic kidney disease stage III, who was admitted to [facility] on 08/31/2022 for Hyperkalemia [E87.5] AKI (acute kidney injury) (HCC) [N17.9] Acute kidney injury superimposed on chronic kidney disease (HCC) [N17.9, N18.9]  Acute kidney injury with hyperkalemia on chronic kidney disease stage IIIa.  Baseline creatinine appears to be 1.2 with GFR 50 on 03/16/2022.  Renal ultrasound negative for obstruction.  Simple cyst noted.  No recent IV contrast exposure. Creatinine on admission 2.76. Receiving lokelma to manage potassium. Agree with PRN IV furosemide.   2. Anemia of chronic kidney disease Lab Results  Component Value Date   HGB 9.7 (L) 09/01/2022    Hgb within desired goal  3. Hypertension with chronic kidney disease. Home regimen includes amlodipine, hydralazine, losartan, and furosemide.  Losartan currently held.   LOS: 3    1/13/202411:21 AM

## 2022-09-03 NOTE — Discharge Summary (Signed)
Physician Discharge Summary   Patient: Hayley Maynard MRN: [id number]  DOB: 1922/08/05   Admit:     Date of Admission: 08/31/2022 Admitted from: home   Discharge: Date of discharge: 09/03/22 Disposition: Home Condition at discharge: fair  CODE STATUS: FULL CODE      Discharge Physician: Emeterio Reeve, DO [facility]     PCP: Maryland Pink, MD  Recommendations for Outpatient Follow-up:  Follow up with PCP Maryland Pink, MD in 1 weeks Please obtain labs/tests: Home health RN to obtain BMP in 2 to 3 days.  PCP may consider CBC, BMP, UA in 1 week Please follow up on the following pending results: none Needs referral to outpatient nephrology Evaluate for continued use change medications - holding ARB, increased Lasix and added Lokelma per nephrology recommendations to treat hyperkalemia  PCP AND OTHER OUTPATIENT PROVIDERS: SEE BELOW FOR SPECIFIC DISCHARGE INSTRUCTIONS PRINTED FOR PATIENT IN ADDITION TO GENERIC AVS PATIENT INFO     Discharge Instructions     Diet - low sodium heart healthy   Complete by: As directed    Increase activity slowly   Complete by: As directed          Discharge Diagnoses: Principal Problem:   Hyperkalemia Active Problems:   Acute kidney injury superimposed on chronic kidney disease [facility])   Essential hypertension   Dyslipidemia       Hospital Course: Hayley Maynard is a 89 y.o. African-American female with medical history significant for Stage III chronic kidney disease, CAD, hypertension, dyslipidemia and osteoarthritis, who presented to the emergency room with acute onset of abnormal labs with elevated renal functions and potassium as checked by his PCP earlier during the day.    01/10: ED - BP was 144/69, other VSS. Labs revealed hyperkalemia of 6.7 and a BUN 51 with a creatinine of 2.76 up from 1.3 in 02/2022. CO2 is 21 and calcium 8.8. CBC stable anemia. EKG showed atrial flutter with 3-1 AV block and a rate of 95 with  ventricular bigeminy, Q waves inferiorly and laterally.  Chest x-ray showed small left pleural effusion and left basilar atelectasis or pneumonia with diffuse interstitial coarsening that may be chronic or due to edema. IV calcium gluconate, an amp of D50, 5 units of IV insulin, an amp of sodium bicarbonate and 10 g of p.o. Lokelma. Admitted to hospitalist service.  01/11: Cr 2.41, K down to 5.8 this morning then to 6.1 in evening. Hgb 9.7 likely hemodilutional. UA unimpressive, awairt UCx since no UTI symptoms. Continue IV fluids. Re-dose Lokelma x2. Obtain renal US 01/12: renal US (+)medical renal disease. Cr 2.03, BUN 40, K 5.9. Asked nephrology to assess given slow improvement, concern for potential advancing CKD versus purely AKI. Recs for continue scheduled Lokelma and ok for prn Lasix 40 mg  01/13: CR 2.01, K 5.4.  Patient ambulated some with physical therapy, she typically gets around with wheelchair/walker, feels to be at baseline.  Discussed with daughter, will get set up for home health and plan for home health RN or patient's PCP to check metabolic panel early next week, patient will need to be established with nephrology    Consultants:  Nephrology   Procedures: none      ASSESSMENT & PLAN:   Principal Problem:   Hyperkalemia Active Problems:   Acute kidney injury superimposed on chronic kidney disease ([place])   Essential hypertension   Dyslipidemia   Hyperkalemia - improving Likely d/t worsening renal fxn see below  Improved but not normalized, however felt safe for discharge with close outpatient follow up given consistent trend toward improvement and likely chronic new CKD4 Monitor BMP outpatient Continue Lokelma  Add Lasix per nephrology 40 mg daily   Acute kidney injury superimposed on chronic kidney disease ([facility]) vs new CKD4  Renal US (+)medical renal disease, nonobstructing stones  UTI ruled out avoid nephrotoxins. Nephrology consult - see above and will need  outpatient follow-up  Gout vs OA Pt reports gout flare w/ known history - improved  XR foot --> degnerative and demineralization changes Location classic for gout but exam unimpressive Unable to give colchicine / NSAID d/t renal impairment, stopped her home febuxostat SoluMedrol x1 improved pain Consider ortho or podiatry consult or outpatient    Essential hypertension continue home antihypertensives.  Dyslipidemia continue home statin therapy.            Discharge Instructions  Allergies as of 09/03/2022       Reactions   Propoxyphene Other (See Comments)   Telmisartan-hctz Other (See Comments)        Medication List     STOP taking these medications    febuxostat 40 MG tablet Commonly known as: ULORIC   furosemide 20 MG tablet Commonly known as: LASIX   losartan 50 MG tablet Commonly known as: COZAAR       TAKE these medications    acetaminophen 325 MG tablet Commonly known as: TYLENOL Take 1-2 tablets (325-650 mg total) by mouth every 6 (six) hours as needed for mild pain, fever or headache.   amLODipine 10 MG tablet Commonly known as: NORVASC Take 10 mg by mouth daily.   brimonidine 0.2 % ophthalmic solution Commonly known as: ALPHAGAN Place 1 drop into both eyes in the morning and at bedtime.   cyanocobalamin 1000 MCG tablet Commonly known as: VITAMIN B12 Take 1,000 mcg by mouth daily.   dorzolamide-timolol 2-0.5 % ophthalmic solution Commonly known as: COSOPT Place 1 drop into both eyes 2 (two) times daily.   feeding supplement Liqd Take 237 mLs by mouth 2 (two) times daily between meals.   ferrous fumarate-b12-vitamic C-folic acid capsule Commonly known as: TRINSICON / FOLTRIN Take 1 capsule by mouth 2 (two) times daily after a meal.   hydrALAZINE 50 MG tablet Commonly known as: APRESOLINE Take 1 tablet by mouth 2 (two) times daily.   latanoprost 0.005 % ophthalmic solution Commonly known as: XALATAN Place 1 drop into both  eyes at bedtime.   megestrol 40 MG/ML suspension Commonly known as: MEGACE Take 800 mg by mouth daily.   simvastatin 20 MG tablet Commonly known as: ZOCOR Take 20 mg by mouth daily.   sodium zirconium cyclosilicate 10 g Pack packet Commonly known as: LOKELMA Take 10 g by mouth daily. Start taking on: September 04, 2022         Follow-up Information     Jerl Mina, MD. Call.   Specialty: Family Medicine Why: appointment ASAP for hospital follow up and recheck blood work, referral to kidney specialist outpatient Contact information: [address] [facility] [place] [address] [phone]                 Allergies  Allergen Reactions   Propoxyphene Other (See Comments)   Telmisartan-Hctz Other (See Comments)     Subjective: Patient is a bit tired/sleepy this morning but reports feeling okay.  Daughter is at bedside, no concerns.  Daughter feels patient is at baseline    Discharge Exam:  BP (!) 153/90 (BP Location: Left Arm)   Pulse 92   Temp (!) 97.5 F (36.4 C) (Oral)   Resp 20   Ht 5\' 1"  (1.549 m)   Wt 58 kg   SpO2 (!) 88%   BMI 24.16 kg/m  General: Pt is alert, awake, not in acute distress Cardiovascular: RRR, S1/S2 +, no rubs, no gallops Respiratory: CTA bilaterally, no wheezing, no rhonchi Abdominal: Soft, NT, ND, bowel sounds + Extremities: trace edema, no cyanosis     The results of significant diagnostics from this hospitalization (including imaging, microbiology, ancillary and laboratory) are listed below for reference.     Microbiology: Recent Results (from the past 240 hour(s))  Urine Culture     Status: Abnormal   Collection Time: 09/01/22  9:35 AM   Specimen: Urine, Clean Catch  Result Value Ref Range Status   Specimen Description   Final    URINE, CLEAN CATCH Performed at [facility], [address], [place], [place]    Special Requests   Final    NONE Performed at [facility],  [address], [place], [place]    Culture (A)  Final    <10,000 COLONIES/mL INSIGNIFICANT GROWTH Performed at [facility], [address], [place], [place]    Report Status 09/02/2022 FINAL  Final     Labs: BNP (last 3 results) No results for input(s): "BNP" in the last 8760 hours. Basic Metabolic Panel: Recent Labs  Lab 08/31/22 2009 08/31/22 2206 09/01/22 0453 09/01/22 1919 09/02/22 0256 09/03/22 0707  NA 140  --  139 140 139 140  K 6.7*  --  5.8* 6.1* 5.9* 5.4*  CL 111  --  112* 113* 115* 112*  CO2 21*  --  18* 22 20* 20*  GLUCOSE 108* 96 105* 99 97 87  BUN 51*  --  46* 42* 40* 40*  CREATININE 2.76*  --  2.41* 2.10* 2.03* 2.01*  CALCIUM 8.8*  --  8.4* 8.3* 8.4* 9.0   Liver Function Tests: No results for input(s): "AST", "ALT", "ALKPHOS", "BILITOT", "PROT", "ALBUMIN" in the last 168 hours. No results for input(s): "LIPASE", "AMYLASE" in the last 168 hours. No results for input(s): "AMMONIA" in the last 168 hours. CBC: Recent Labs  Lab 08/31/22 2009 09/01/22 0453  WBC 7.3 6.9  HGB 10.5* 9.7*  HCT 34.2* 30.8*  MCV 97.4 95.4  PLT 217 200   Cardiac Enzymes: No results for input(s): "CKTOTAL", "CKMB", "CKMBINDEX", "TROPONINI" in the last 168 hours. BNP: Invalid input(s): "POCBNP" CBG: Recent Labs  Lab 09/01/22 0129 09/01/22 0229  GLUCAP 49* 128*   D-Dimer No results for input(s): "DDIMER" in the last 72 hours. Hgb A1c No results for input(s): "HGBA1C" in the last 72 hours. Lipid Profile No results for input(s): "CHOL", "HDL", "LDLCALC", "TRIG", "CHOLHDL", "LDLDIRECT" in the last 72 hours. Thyroid function studies No results for input(s): "TSH", "T4TOTAL", "T3FREE", "THYROIDAB" in the last 72 hours.  Invalid input(s): "FREET3" Anemia work up No results for input(s): "VITAMINB12", "FOLATE", "FERRITIN", "TIBC", "IRON", "RETICCTPCT" in the last 72 hours. Urinalysis    Component Value Date/Time   COLORURINE YELLOW (A)  09/01/2022 0935   APPEARANCEUR CLEAR (A) 09/01/2022 0935   LABSPEC 1.012 09/01/2022 0935   PHURINE 7.0 09/01/2022 0935   GLUCOSEU NEGATIVE 09/01/2022 0935   HGBUR NEGATIVE 09/01/2022 0935   BILIRUBINUR NEGATIVE 09/01/2022 0935   KETONESUR NEGATIVE 09/01/2022 0935   PROTEINUR NEGATIVE 09/01/2022 0935   NITRITE  NEGATIVE 09/01/2022 0935   LEUKOCYTESUR TRACE (A) 09/01/2022 0935   Sepsis Labs Recent Labs  Lab 08/31/22 2009 09/01/22 0453  WBC 7.3 6.9   Microbiology Recent Results (from the past 240 hour(s))  Urine Culture     Status: Abnormal   Collection Time: 09/01/22  9:35 AM   Specimen: Urine, Clean Catch  Result Value Ref Range Status   Specimen Description   Final    URINE, CLEAN CATCH Performed at [facility], [address], [place], [place] [address]    Special Requests   Final    NONE Performed at [facility], [address] [place], [place] [address]    Culture (A)  Final    <10,000 COLONIES/mL INSIGNIFICANT GROWTH Performed at [facility] [address] [place], [place] [address]    Report Status 09/02/2022 FINAL  Final   Imaging DG Foot 2 Views Right  Result Date: 09/01/2022 CLINICAL DATA:  Gout, foot pain EXAM: RIGHT FOOT - 2 VIEW COMPARISON:  None Available. FINDINGS: Severe bony demineralization. Hammertoe deformities of the second through fifth digits causing bony superimposition and difficulty assessing the phalanges. Hallux valgus. Medial bony bunion. Probable pes planus. Ill definition of the subtalar joints may be projectional, there is substantial overlap of the talus and calcaneus on the lateral view attempt. Degenerative findings at the Lisfranc joint. No definite fracture is identified, although hindfoot assessment is very limited. IMPRESSION: 1. Severe bony demineralization. 2. Hallux valgus with medial bony bunion. 3. Hammertoe deformities of the second through fifth digits. 4. Degenerative findings at the Lisfranc joint.  5. Ill definition of the subtalar joints may be projectional, although hindfoot assessment is very limited on the lateral view attempt. Electronically Signed   By: Van Clines M.D.   On: 09/01/2022 16:48   US RENAL  Result Date: 09/01/2022 CLINICAL DATA:  Acute kidney injury EXAM: RENAL / URINARY TRACT ULTRASOUND COMPLETE COMPARISON:  None Available. FINDINGS: Right Kidney: Renal measurements: 9.7 x 4.3 x 3.3 cm = volume: 73.1 mL. No hydronephrosis. Increased renal cortical echogenicity with cortical thinning. There are multiple simple renal cysts, largest measuring up to 5.1 cm. These require no follow-up imaging. Left Kidney: Renal measurements: 9.5 x 3.8 x 3.9 cm = volume: 64.4 mL. There are multiple simple cysts, largest measuring up to 2 1.4 cm. There is a 5 mm shadowing renal calculus in the lower pole. No hydronephrosis. There is increased renal cortical echogenicity. Bladder: Appears normal for degree of bladder distention. Other: None. IMPRESSION: No hydronephrosis. Increased renal cortical echogenicity bilaterally with cortical thinning on the right, consistent with chronic medical renal disease. Multiple simple renal cysts bilaterally, which require no follow-up imaging. Nonobstructive 5 mm stone in the lower pole of the left kidney. Electronically Signed   By: Maurine Simmering M.D.   On: 09/01/2022 16:37   DG Chest 1 View  Result Date: 08/31/2022 CLINICAL DATA:  Swelling in feet and legs and high potassium EXAM: CHEST  1 VIEW COMPARISON:  03/31/2022 FINDINGS: Stable cardiomediastinal silhouette. Sternotomy and CABG. Aortic Atherosclerosis (ICD10-I70.0). Small left pleural effusion and left basilar atelectasis/consolidation. Diffuse interstitial coarsening. No pneumothorax. IMPRESSION: Small left effusion and left basilar atelectasis or pneumonia. Diffuse interstitial coarsening may be chronic or due to edema. Electronically Signed   By: Placido Sou M.D.   On: 08/31/2022 20:36      Time  coordinating discharge: over 30 minutes  SIGNED:  Emeterio Reeve DO [facility]

## 2022-09-03 NOTE — Evaluation (Signed)
Physical Therapy Evaluation Patient Details Name: Hayley Maynard MRN: [id number] DOB: July 27, 1922 Today's Date: 09/03/2022  History of Present Illness  presented to ED from PCP secondary to abnormal lab values; admitted for management of hyperkalemia (likely related to reduced renal function)  Clinical Impression  Patient sleeping upon arrival to room; awakens to mod tactile cues/voice.  Maintains eyes closed majority of session, but does open with direct cuing from therapist.  Patient oriented to self, location and general situation; follows simple commands, but often requires hand-over-hand assist for full sequencing/initiation of mobility tasks.  Denies pain.  Globally weak and deconditioned throughout all extremities.  Baseline ROM deficits to bilat shoulders (chronic arthritic changes); able to bring bilat hands to face/mouth as needed.  Bilat LE ROM grossly WFL for basic transfers and mobility; however patient somewhat guarded with movement of R LE due to history of orthopedic injury (approx 2 years prior). Currently requiring mod/max assist for bed mobility (daughter reporting patient does not sleep in bed; prefers recliner/lift chair); mod assist for sit/stand, SPT with RW.  Requires hand-over-hand assist for UE placement on RW.  Step-by-step/constant cuing for task sequencing and initiation.  Mod assist for lift off, anterior weight translation and standing balance. Mod/max cuing for walker management and transfer sequencing; constant hands-on support for balance, safety and overall task direction.  Additional mobility/gait efforts deferred, as patient minimally ambulatory at baseline. Would benefit from skilled PT to address above deficits and promote optimal return to PLOF.; Recommend transition to HHPT upon discharge from acute hospitalization.    Daughter present at bedside throughout session; very supportive/encouraging to patient.  Feels transfer ability is grossly comparable to baseline  (except generally weaker throughout), and is comfortable managing care in home environment.  No additional equipment needs identified at this time; interested in/agreeable to HHPT.  MD/TOC informed/aware.     Recommendations for follow up therapy are one component of a multi-disciplinary discharge planning process, led by the attending physician.  Recommendations may be updated based on patient status, additional functional criteria and insurance authorization.  Follow Up Recommendations Home health PT      Assistance Recommended at Discharge Frequent or constant Supervision/Assistance  Patient can return home with the following  A lot of help with walking and/or transfers;A lot of help with bathing/dressing/bathroom    Equipment Recommendations  (has RW, WC and BSC)  Recommendations for Other Services       Functional Status Assessment Patient has had a recent decline in their functional status and demonstrates the ability to make significant improvements in function in a reasonable and predictable amount of time.     Precautions / Restrictions Precautions Precautions: Fall Restrictions Weight Bearing Restrictions: No      Mobility  Bed Mobility Overal bed mobility: Needs Assistance Bed Mobility: Supine to Sit     Supine to sit: Mod assist, Max assist          Transfers Overall transfer level: Needs assistance Equipment used: Rolling walker (2 wheels) Transfers: Sit to/from Stand, Bed to chair/wheelchair/BSC Sit to Stand: Mod assist Stand pivot transfers: Mod assist         General transfer comment: mod/max assist to scoot towards edge of bed; hand-over-hand assist for UE placement on RW.  Step-by-step/constant cuing for task sequencing and initiation.  Mod assist for lift off, anterior weight translation and standing balance. Mod/max cuing for walker management and transfer sequencing; constant hands-on support for balance, safety and overall task direction.     Ambulation/Gait  General Gait Details: deferred; minimally ambulatory at baseline  Stairs            Wheelchair Mobility    Modified Rankin (Stroke Patients Only)       Balance Overall balance assessment: Needs assistance Sitting-balance support: No upper extremity supported, Feet supported Sitting balance-Leahy Scale: Fair Sitting balance - Comments: kyphotic, posterior trunk lean/weight shift Postural control: Posterior lean Standing balance support: Bilateral upper extremity supported Standing balance-Leahy Scale: Poor Standing balance comment: +1 min/mod assist to maintain balance at all times                             Pertinent Vitals/Pain Pain Assessment Pain Assessment: No/denies pain    Home Living Family/patient expects to be discharged to:: Private residence Living Arrangements: Children Available Help at Discharge: Family;Available 24 hours/day Type of Home: House Home Access:  (3 steps; chair lift over stairs)       Home Layout: Two level;Able to live on main level with bedroom/bathroom Home Equipment: Rolling Walker (2 wheels);BSC/3in1;Transport chair      Prior Function Prior Level of Function : Needs assist             Mobility Comments: WC level as primary mobility; RW and +1 assist for all transfers (SPT) with limited household distances (10-15' max). Denies fall history.       Hand Dominance   Dominant Hand: Right    Extremity/Trunk Assessment   Upper Extremity Assessment Upper Extremity Assessment: Generalized weakness (baseline ROM deficits to bilat shoulders, all planes, approx 10-20% normal ROM; generally weak and deconditioned.  Hand-over-hand assist for placement on RW)    Lower Extremity Assessment Lower Extremity Assessment: Generalized weakness (grossly 3-/5 throughout bilat LEs, generally cautious with R LE (due to ortho injury approx 2 years prior); generally weak and deconditioned  throughout)       Communication   Communication: No difficulties  Cognition Arousal/Alertness: Awake/alert Behavior During Therapy: WFL for tasks assessed/performed Overall Cognitive Status: Within Functional Limits for tasks assessed                                 General Comments: Sleeping upon arrival; awakens to light touch/voice.  Generally maintains eyes closed, but maintains responsiveness to therapist throughout session.  Follows simple commands, requiring hand-over-hand for mobility efforts        General Comments      Exercises     Assessment/Plan    PT Assessment Patient needs continued PT services  PT Problem List Decreased strength;Decreased range of motion;Decreased activity tolerance;Decreased balance;Decreased knowledge of use of DME;Decreased safety awareness;Decreased knowledge of precautions;Decreased mobility       PT Treatment Interventions DME instruction;Functional mobility training;Therapeutic exercise;Therapeutic activities;Balance training;Patient/family education    PT Goals (Current goals can be found in the Care Plan section)  Acute Rehab PT Goals Patient Stated Goal: to go home today PT Goal Formulation: With patient/family Time For Goal Achievement: 09/17/22 Potential to Achieve Goals: Fair    Frequency Min 2X/week     Co-evaluation               AM-PAC PT "6 Clicks" Mobility  Outcome Measure Help needed turning from your back to your side while in a flat bed without using bedrails?: A Lot Help needed moving from lying on your back to sitting on the side of a flat bed without using  bedrails?: A Lot Help needed moving to and from a bed to a chair (including a wheelchair)?: A Lot Help needed standing up from a chair using your arms (e.g., wheelchair or bedside chair)?: A Lot Help needed to walk in hospital room?: A Lot Help needed climbing 3-5 steps with a railing? : Total 6 Click Score: 11    End of Session  Equipment Utilized During Treatment: Gait belt Activity Tolerance: Patient tolerated treatment well Patient left: in chair;with call bell/phone within reach (alarm pad under patient; box not available in room. RN informed/aware.  Daughter at bedside; aware of need/in agreement to call staff for assist as needed) Nurse Communication: Mobility status PT Visit Diagnosis: Muscle weakness (generalized) (M62.81)    Time: 1129-1150 PT Time Calculation (min) (ACUTE ONLY): 21 min   Charges:   PT Evaluation $PT Eval Moderate Complexity: 1 Mod         Sherel Fennell H. Manson Passey, PT, DPT, NCS 09/03/22, 12:06 PM [phone]

## 2022-09-21 ENCOUNTER — Ambulatory Visit: Payer: Medicare Other | Attending: Cardiology | Admitting: Cardiology

## 2022-09-21 ENCOUNTER — Encounter: Payer: Self-pay | Admitting: Cardiology

## 2022-09-21 VITALS — BP 128/80 | HR 102 | Ht 61.0 in | Wt 130.0 lb

## 2022-09-21 DIAGNOSIS — R600 Localized edema: Secondary | ICD-10-CM

## 2022-09-21 DIAGNOSIS — I10 Essential (primary) hypertension: Secondary | ICD-10-CM

## 2022-09-21 DIAGNOSIS — I4819 Other persistent atrial fibrillation: Secondary | ICD-10-CM | POA: Diagnosis not present

## 2022-09-21 DIAGNOSIS — Z951 Presence of aortocoronary bypass graft: Secondary | ICD-10-CM

## 2022-09-21 DIAGNOSIS — N183 Chronic kidney disease, stage 3 unspecified: Secondary | ICD-10-CM

## 2022-09-21 MED ORDER — METOPROLOL SUCCINATE ER 25 MG PO TB24: 25.0000 mg | ORAL_TABLET | Freq: Every day | ORAL | 3 refills | Status: DC

## 2022-09-21 MED ORDER — TORSEMIDE 20 MG PO TABS: 20.0000 mg | ORAL_TABLET | Freq: Two times a day (BID) | ORAL | 3 refills | Status: DC

## 2022-09-21 MED ORDER — ASPIRIN 81 MG PO TBEC: 81.0000 mg | DELAYED_RELEASE_TABLET | Freq: Every day | ORAL | 3 refills | Status: DC

## 2022-09-21 NOTE — Progress Notes (Signed)
Cardiology Office Note:    Date:  09/21/2022   ID:  Hayley Maynard, DOB 14-Oct-1921, MRN [id number]  PCP:  Jerl Mina, MD   [facility] Providers Cardiologist:  Debbe Odea, MD     Referring MD: Jerl Mina, MD   Chief Complaint  Patient presents with   New Patient (Initial Visit)    Establish care for CAD; former patient of Dr. Gwen Pounds. Patient c/o bilateral LE edema. Medications reviewed by the daughter Agustin Cree) on the [place].     History of Present Illness:    Hayley Maynard is a 89 y.o. female with a hx of CAD/CABG (states over 25yrs ago at [facility]), hypertension, hyperlipidemia, CKD 3, who presents to establish care.  Previously seen by [facility] cardiology from a cardiac perspective.  Previous cardiologist moved.  She denies palpitations or chest pain, endorses leg edema.  Previously placed on Lasix per PCP, edema improved, Lasix was stopped.  With worsening leg edema, Lasix restarted but edema persisted.  She has history of falls, last fall 3 days ago, was told by EMS her heart rhythm was irregular.  Has had several falls before, fracturing again in 2022.  Recent blood work revealed elevated potassium, losartan was stopped.  Does not see nephrology.  Echo 01/2021 EF 50 to 55%   Past Medical History:  Diagnosis Date   Arthritis    feet   CKD (chronic kidney disease), stage III (HCC)    Coronary artery disease    Glaucoma    Hyperlipidemia    Hypertension    LVH (left ventricular hypertrophy)    Wears dentures    full upper and lower    Past Surgical History:  Procedure Laterality Date   ABDOMINAL HYSTERECTOMY     CATARACT EXTRACTION W/ INTRAOCULAR LENS  IMPLANT, BILATERAL     CORONARY ARTERY BYPASS GRAFT     over 10 yrs ago (per pt)   INTRAMEDULLARY (IM) NAIL INTERTROCHANTERIC Right 03/20/2020   Procedure: INTRAMEDULLARY (IM) NAIL INTERTROCHANTRIC;  Surgeon: Kennedy Bucker, MD;  Location: [facility] ORS;  Service: Orthopedics;  Laterality: Right;   PHOTOCOAGULATION  WITH LASER Right 09/26/2018   Procedure: PHOTOCOAGULATION WITH LASER;  Surgeon: Lockie Mola, MD;  Location: [facility];  Service: Ophthalmology;  Laterality: Right;  laser settings: , 31.3% duty cycle, 120 seconds    Current Medications: Current Meds  Medication Sig   acetaminophen (TYLENOL) 325 MG tablet Take 1-2 tablets (325-650 mg total) by mouth every 6 (six) hours as needed for mild pain, fever or headache.   aspirin EC 81 MG tablet Take 1 tablet (81 mg total) by mouth daily. Swallow whole.   brimonidine (ALPHAGAN) 0.2 % ophthalmic solution Place 1 drop into both eyes in the morning and at bedtime.    dorzolamide-timolol (COSOPT) 22.3-6.8 MG/ML ophthalmic solution Place 1 drop into both eyes 2 (two) times daily.   feeding supplement (ENSURE ENLIVE / ENSURE PLUS) LIQD Take 237 mLs by mouth 2 (two) times daily between meals.   ferrous fumarate-b12-vitamic C-folic acid (TRINSICON / FOLTRIN) capsule Take 1 capsule by mouth 2 (two) times daily after a meal.   hydrALAZINE (APRESOLINE) 50 MG tablet Take 1 tablet by mouth 2 (two) times daily.   latanoprost (XALATAN) 0.005 % ophthalmic solution Place 1 drop into both eyes at bedtime.   megestrol (MEGACE) 40 MG/ML suspension Take 800 mg by mouth daily.   metoprolol succinate (TOPROL XL) 25 MG 24 hr tablet Take 1 tablet (25 mg total) by mouth daily.  sodium zirconium cyclosilicate (LOKELMA) 10 g PACK packet Take 10 g by mouth daily.   torsemide (DEMADEX) 20 MG tablet Take 1 tablet (20 mg total) by mouth 2 (two) times daily.   vitamin B-12 (CYANOCOBALAMIN) 1000 MCG tablet Take 1,000 mcg by mouth daily.   [DISCONTINUED] amLODipine (NORVASC) 10 MG tablet Take 10 mg by mouth daily.   [DISCONTINUED] simvastatin (ZOCOR) 20 MG tablet Take 20 mg by mouth daily.     Allergies:   Propoxyphene and Telmisartan-hctz   Social History   Socioeconomic History   Marital status: Widowed    Spouse name: Not on file   Number of children:  Not on file   Years of education: Not on file   Highest education level: Not on file  Occupational History   Not on file  Tobacco Use   Smoking status: Former    Packs/day: 0.50    Years: 20.00    Total pack years: 10.00    Types: Cigarettes    Quit date: 02/20/2020    Years since quitting: 2.5   Smokeless tobacco: Never  Vaping Use   Vaping Use: Never used  Substance and Sexual Activity   Alcohol use: Not Currently   Drug use: Not Currently   Sexual activity: Not on file  Other Topics Concern   Not on file  Social History Narrative   Not on file   Social Determinants of Health   Financial Resource Strain: Not on file  Food Insecurity: Not on file  Transportation Needs: Not on file  Physical Activity: Not on file  Stress: Not on file  Social Connections: Not on file     Family History: The patient's family history is not on file.  ROS:   Please see the history of present illness.     All other systems reviewed and are negative.  EKGs/Labs/Other Studies Reviewed:    The following studies were reviewed today:   EKG:  EKG is  ordered today.  The ekg ordered today demonstrates atrial fibrillation, heart rate 102  Recent Labs: 09/01/2022: Hemoglobin 9.7; Platelets 200 09/03/2022: BUN 40; Creatinine, Ser 2.01; Potassium 5.4; Sodium 140  Recent Lipid Panel    Component Value Date/Time   CHOL 150 03/22/2021 0432   TRIG 39 03/22/2021 0432   HDL 75 03/22/2021 0432   CHOLHDL 2.0 03/22/2021 0432   VLDL 8 03/22/2021 0432   LDLCALC 67 03/22/2021 0432     Risk Assessment/Calculations:             Physical Exam:    VS:  BP 128/80 (BP Location: Right Arm, Patient Position: Sitting, Cuff Size: Normal)   Pulse (!) 102   Ht 5\' 1"  (1.549 m)   Wt 130 lb (59 kg)   SpO2 93%   BMI 24.56 kg/m     Wt Readings from Last 3 Encounters:  09/21/22 130 lb (59 kg)  09/01/22 127 lb 13.9 oz (58 kg)  12/06/21 104 lb (47.2 kg)     GEN:  Well nourished, appears frail HEENT:  Normal NECK: No JVD; No carotid bruits CARDIAC: Irregular irregular, 2/6 systolic murmur RESPIRATORY: Diminished breath sounds at bases ABDOMEN: Soft, non-tender, non-distended MUSCULOSKELETAL: 2+ edema SKIN: Warm and dry NEUROLOGIC:  Alert and oriented x 3 PSYCHIATRIC:  Normal affect   ASSESSMENT:    1. S/P CABG (coronary artery bypass graft)   2. Persistent atrial fibrillation ([place])   3. Bilateral leg edema   4. Primary hypertension   5. Stage 3 chronic kidney  disease, unspecified whether stage 3a or 3b CKD ([place])    PLAN:    In order of problems listed above:  CAD/CABG, obtain echo to evaluate cardiac function.  Aspirin 81 mg okay.  I do not see benefit of statin in this patient.  Stop simvastatin. Atrial fibrillation, heart rate 104.  Frequent falls, gait issues.  Avoid anticoagulation, discussed with daughter and patient.  Start Toprol-XL 25 mg daily.  Echo as above. Bilateral leg edema, start torsemide 20 mg daily.  Stop Norvasc.  Echo as above.  Check BMP in 1 week. Hypertension, BP controlled.  Continue hydralazine, Toprol-XL, torsemide. CKD 3, history of hyperkalemia.  Refer to nephrology.  Follow-up after echocardiogram       Medication Adjustments/Labs and Tests Ordered: Current medicines are reviewed at length with the patient today.  Concerns regarding medicines are outlined above.  Orders Placed This Encounter  Procedures   Basic Metabolic Panel (BMET)   Ambulatory referral to Nephrology   EKG 12-Lead   ECHOCARDIOGRAM COMPLETE   Meds ordered this encounter  Medications   torsemide (DEMADEX) 20 MG tablet    Sig: Take 1 tablet (20 mg total) by mouth 2 (two) times daily.    Dispense:  180 tablet    Refill:  3   metoprolol succinate (TOPROL XL) 25 MG 24 hr tablet    Sig: Take 1 tablet (25 mg total) by mouth daily.    Dispense:  90 tablet    Refill:  3   aspirin EC 81 MG tablet    Sig: Take 1 tablet (81 mg total) by mouth daily. Swallow whole.     Dispense:  90 tablet    Refill:  3    Patient Instructions  Medication Instructions:   STOP Amlodipine STOP Simvastatin START Aspirin - take one tablet (81 mg) by mouth daily.  START Toprol XL - take one tablet (25 mg) by mouth daily.  START Torsemide - Take one tablet (20 mg) by mouth daily.   *If you need a refill on your cardiac medications before your next appointment, please call your pharmacy*   Lab Work:  Your physician recommends that you return for lab work in: [facility].  No appt is needed. Hours are M-F 7AM- 6 PM.  If you have labs (blood work) drawn today and your tests are completely normal, you will receive your results only by: [place] (if you have MyChart) OR A paper copy in the mail If you have any lab test that is abnormal or we need to change your treatment, we will call you to review the results.   Testing/Procedures:  Your physician has requested that you have an echocardiogram. Echocardiography is a painless test that uses sound waves to create images of your heart. It provides your doctor with information about the size and shape of your heart and how well your heart's chambers and valves are working. This procedure takes approximately one hour. There are no restrictions for this procedure. Please do NOT wear cologne, perfume, aftershave, or lotions (deodorant is allowed). Please arrive 15 minutes prior to your appointment time.    Follow-Up: At [facility], you and your health needs are our priority.  As part of our continuing mission to provide you with exceptional heart care, we have created designated Provider Care Teams.  These Care Teams include your primary Cardiologist (physician) and Advanced Practice Providers (APPs -  Physician Assistants and Nurse Practitioners) who all work together to  provide you with the care you need, when you need it.  We recommend signing up for the patient portal called "MyChart".  Sign  up information is provided on this After Visit Summary.  MyChart is used to connect with patients for Virtual Visits (Telemedicine).  Patients are able to view lab/test results, encounter notes, upcoming appointments, etc.  Non-urgent messages can be sent to your provider as well.   To learn more about what you can do with MyChart, go to [email or web address].    Your next appointment:    After Echocardiogram  Provider:   Kate Sable, MD       Signed, Kate Sable, MD  09/21/2022 12:09 PM    [place]

## 2022-09-21 NOTE — Patient Instructions (Signed)
Medication Instructions:   STOP Amlodipine STOP Simvastatin START Aspirin - take one tablet (81 mg) by mouth daily.  START Toprol XL - take one tablet (25 mg) by mouth daily.  START Torsemide - Take one tablet (20 mg) by mouth daily.   *If you need a refill on your cardiac medications before your next appointment, please call your pharmacy*   Lab Work:  Your physician recommends that you return for lab work in: [facility] medical mall.  No appt is needed. Hours are M-F 7AM- 6 PM.  If you have labs (blood work) drawn today and your tests are completely normal, you will receive your results only by: [place] (if you have MyChart) OR A paper copy in the mail If you have any lab test that is abnormal or we need to change your treatment, we will call you to review the results.   Testing/Procedures:  Your physician has requested that you have an echocardiogram. Echocardiography is a painless test that uses sound waves to create images of your heart. It provides your doctor with information about the size and shape of your heart and how well your heart's chambers and valves are working. This procedure takes approximately one hour. There are no restrictions for this procedure. Please do NOT wear cologne, perfume, aftershave, or lotions (deodorant is allowed). Please arrive 15 minutes prior to your appointment time.    Follow-Up: At [facility], you and your health needs are our priority.  As part of our continuing mission to provide you with exceptional heart care, we have created designated Provider Care Teams.  These Care Teams include your primary Cardiologist (physician) and Advanced Practice Providers (APPs -  Physician Assistants and Nurse Practitioners) who all work together to provide you with the care you need, when you need it.  We recommend signing up for the patient portal called "MyChart".  Sign up information is provided on this After Visit Summary.  MyChart  is used to connect with patients for Virtual Visits (Telemedicine).  Patients are able to view lab/test results, encounter notes, upcoming appointments, etc.  Non-urgent messages can be sent to your provider as well.   To learn more about what you can do with MyChart, go to [email or web address].    Your next appointment:    After Echocardiogram  Provider:   Kate Sable, MD

## 2022-10-13 IMAGING — CT CT HEAD W/O CM
4 series · 16 of 47 positions shown, 18 images · non-contrast
Comparison: CT head 03/19/2020

CLINICAL DATA: headache this morning, took [place] without
relief. disoriented today to the daughter who lives with her.
Nausea, vomiting x 2

EXAM:
CT HEAD WITHOUT CONTRAST
TECHNIQUE: Contiguous axial images were obtained from the base of the skull
through the vertex without intravenous contrast.

[Series 2: head wo · axial · 0.43mm/px · z∈[+414,+534]mm · 7 of 33 slices shown, 9 images]
[im 5/33  brain]
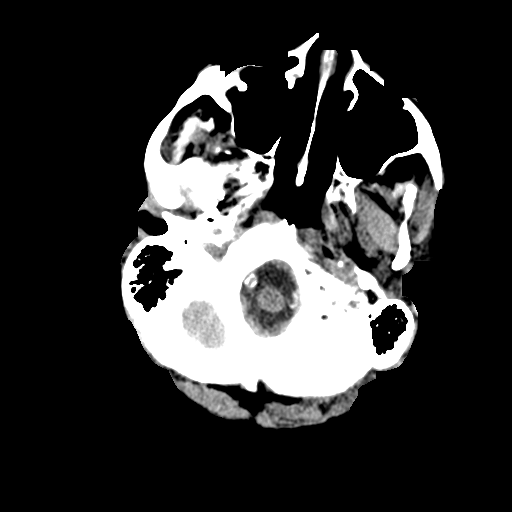
[im 5/33  bone]
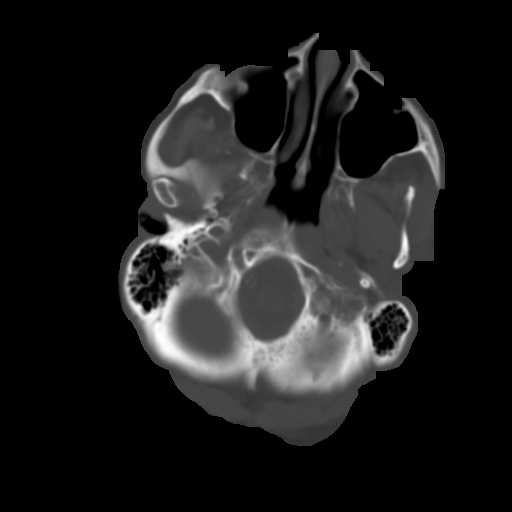
[im 9/33  brain]
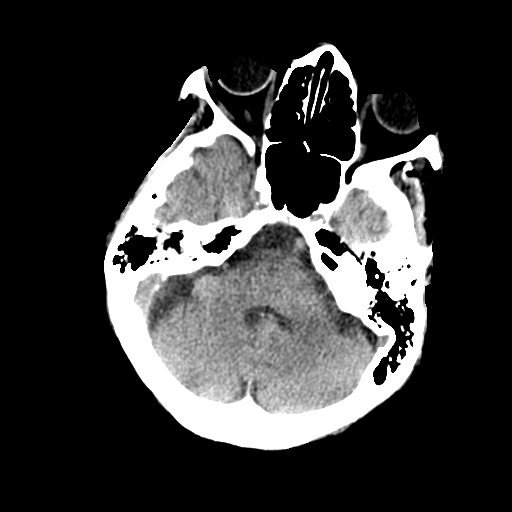
[im 13/33  brain]
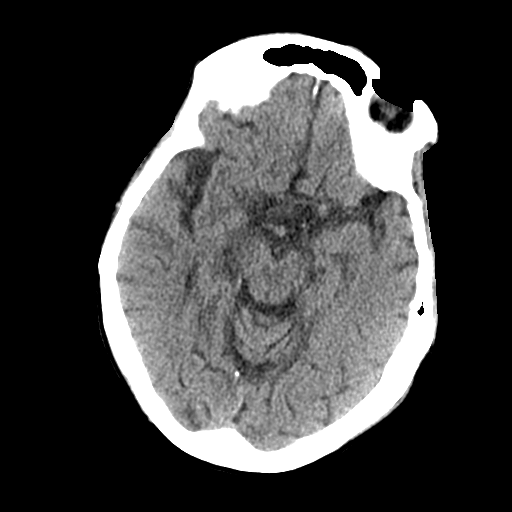
[im 17/33  brain]
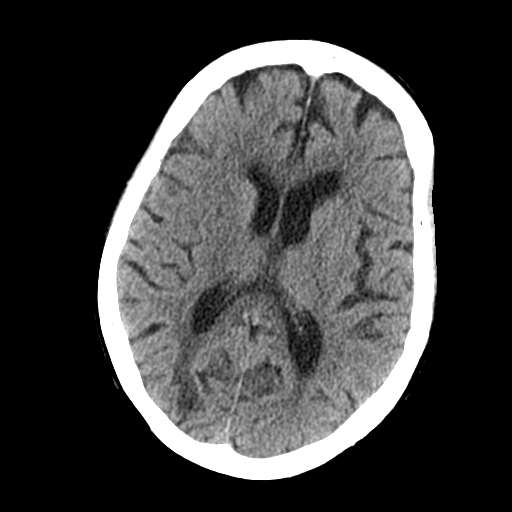
[im 21/33  brain]
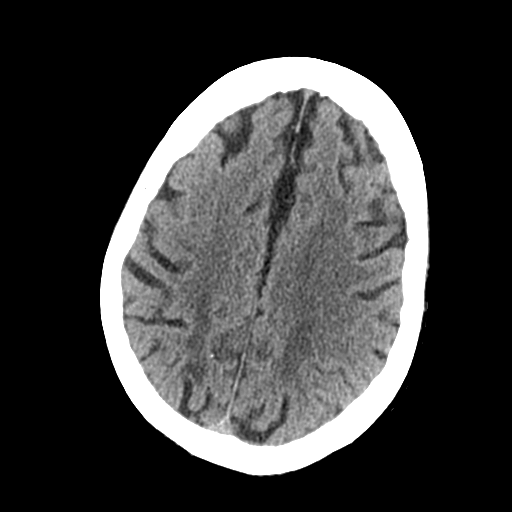
[im 21/33  bone]
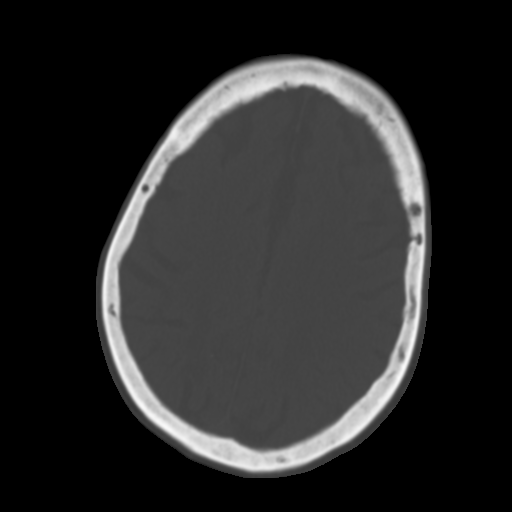
[im 25/33  brain]
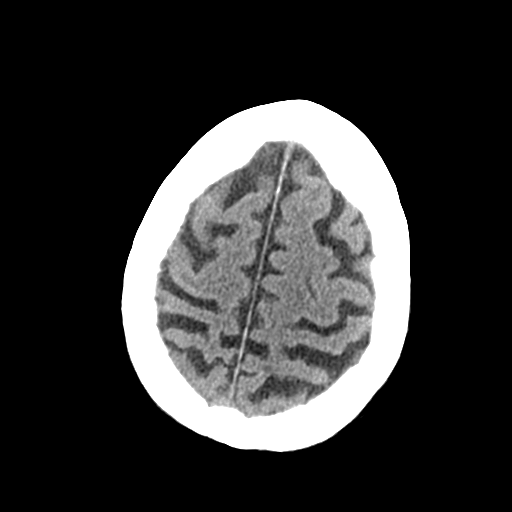
[im 29/33  brain]
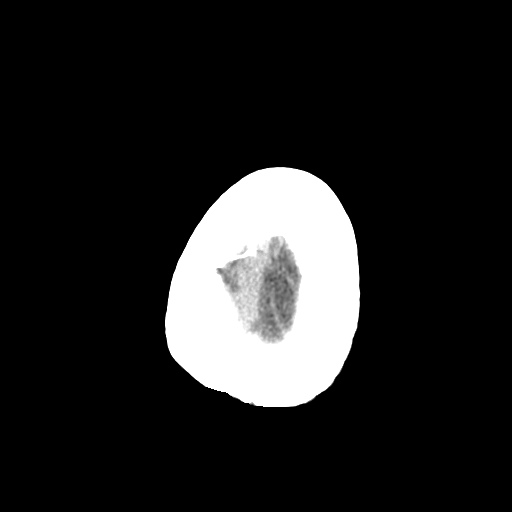

[Series 3: head bone · axial · 0.43mm/px · z∈[+410,+442]mm · 3 of 82 slices shown]
[im 9/82  bone]
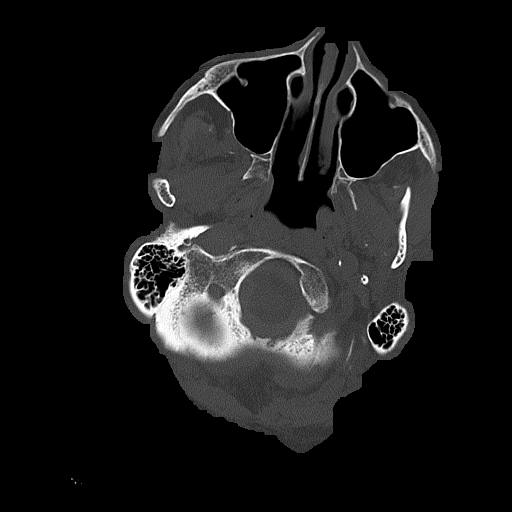
[im 17/82  bone]
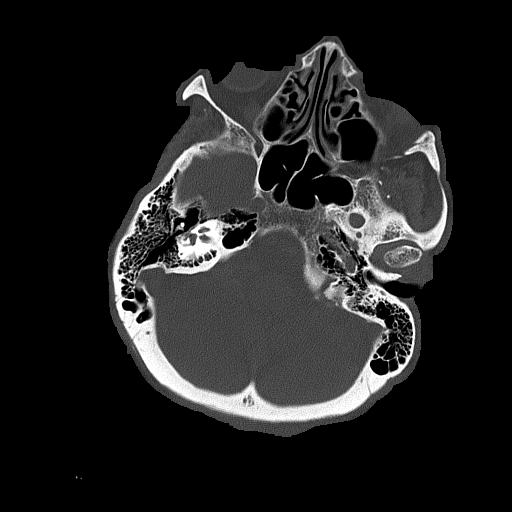
[im 25/82  bone]
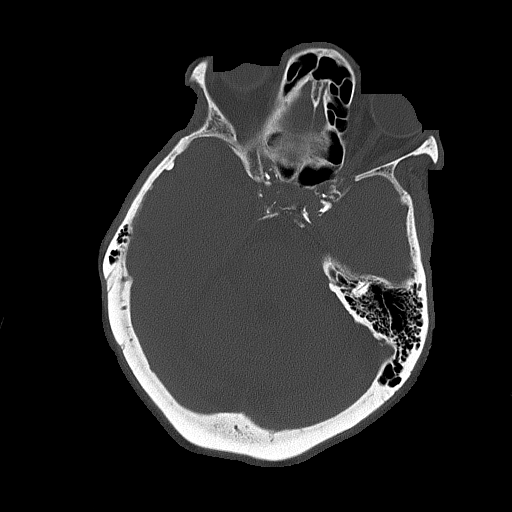

[Series 4: coronal soft tissue · coronal · 0.33mm/px · 3 of 70 slices shown]
[im 24/70  brain]
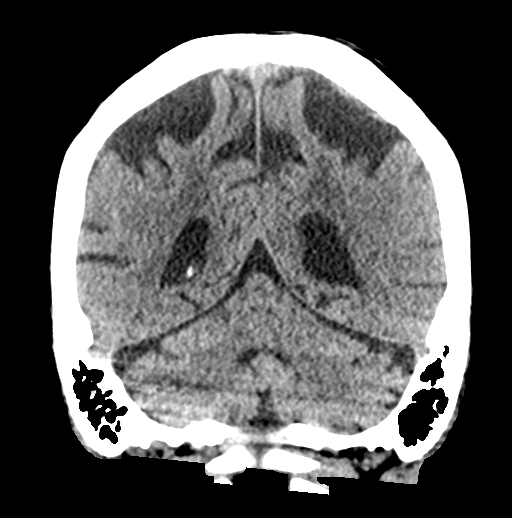
[im 31/70  brain]
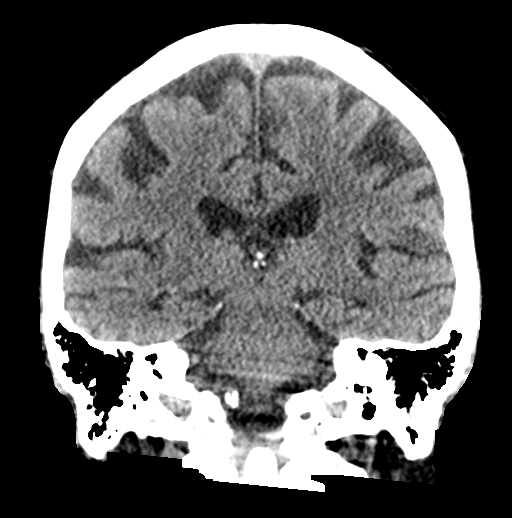
[im 39/70  brain]
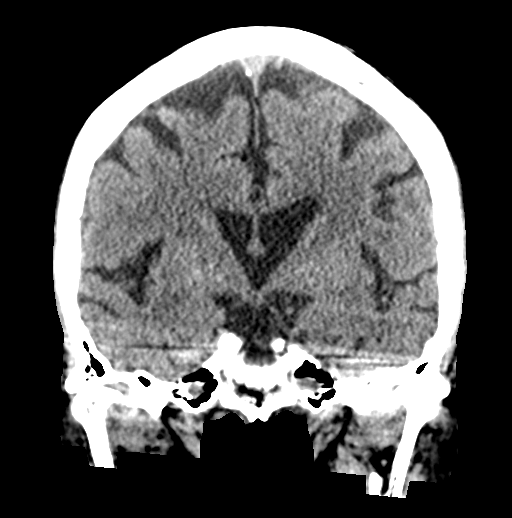

[Series 5: sagittal soft tissue · sagittal · 0.33mm/px · 3 of 53 slices shown]
[im 18/53  brain]
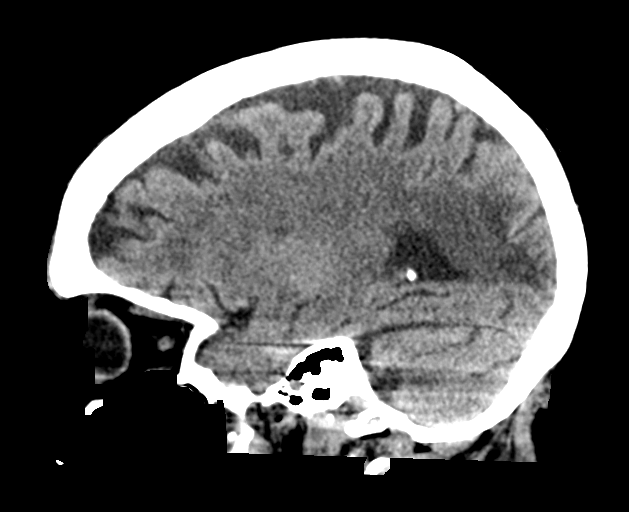
[im 27/53  brain]
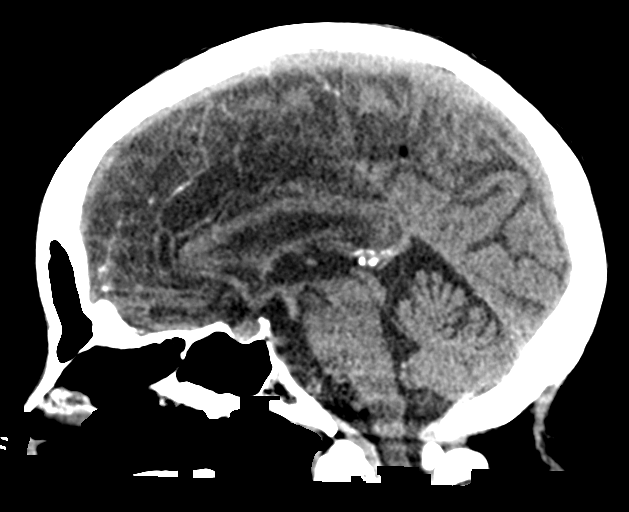
[im 35/53  brain]
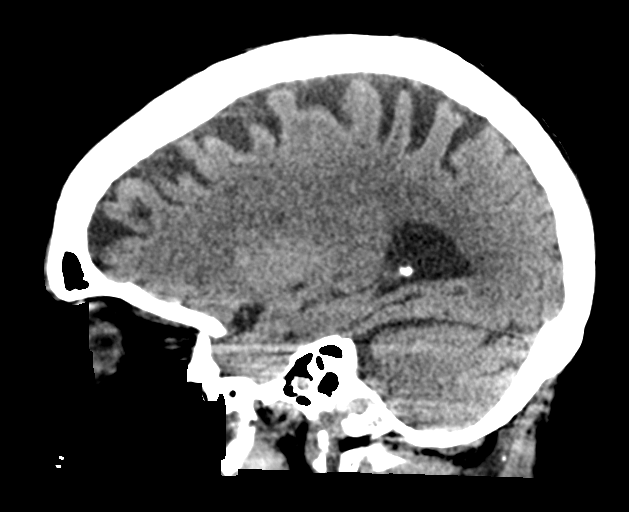

[16 of 47 positions shown; findings below may reference images not displayed]

FINDINGS: Brain:

Cerebral ventricle sizes are concordant with the degree of cerebral
volume loss. Patchy and confluent areas of decreased attenuation are
noted throughout the deep and periventricular white matter of the
cerebral hemispheres bilaterally, compatible with chronic
microvascular ischemic disease.

Interval development of right occipital loss of gray-white matter
differentiation. Question mild associated edema. No parenchymal
hemorrhage. No mass lesion. No extra-axial collection.

No mass effect or midline shift. No hydrocephalus. Basilar cisterns
are patent.

Vascular: No hyperdense vessel. Atherosclerotic calcifications are
present within the cavernous internal carotid and vertebral
arteries.

Skull: No acute fracture or focal lesion.

Sinuses/Orbits: Paranasal sinuses and mastoid air cells are clear.
Bilateral lens replacement. Otherwise the orbits are unremarkable.

Other: None.
IMPRESSION: 1. Age-indeterminate, likely subacute, right occipital infarction.
2. No acute intracranial hemorrhage.

These results were called by telephone at the time of interpretation
on 03/21/2021 at [DATE] to provider AMOUNA IMEN FOUNA , who verbally
acknowledged these results.

## 2022-10-13 IMAGING — MR MR MRA HEAD W/O CM
1 series · 17 of 48 positions shown · non-contrast
Comparison: Prior CT from 03/21/2021.

CLINICAL DATA: Initial evaluation for neuro deficit, stroke
suspected.

EXAM:
MRI HEAD WITHOUT CONTRAST
MRA HEAD WITHOUT CONTRAST
TECHNIQUE: Multiplanar, multi-echo pulse sequences of the brain and surrounding
structures were acquired without intravenous contrast. Angiographic
images of the Circle of Willis were acquired using MRA technique
without intravenous contrast.

[Series 5: TOF · axial · 0.5mm · 0.41mm/px · z∈[-126,-11]mm · 17 of 248 slices shown]
[im 1/248]
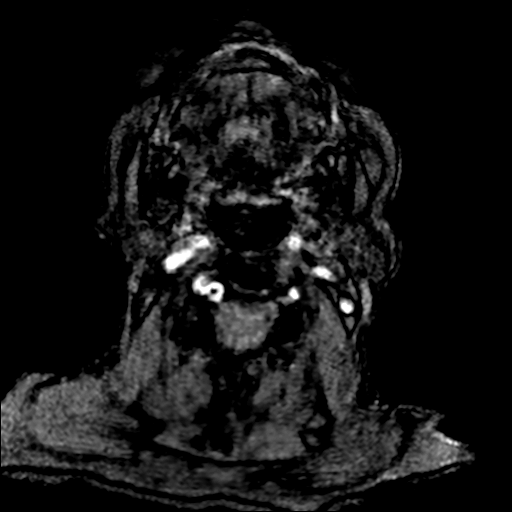
[im 6/248]
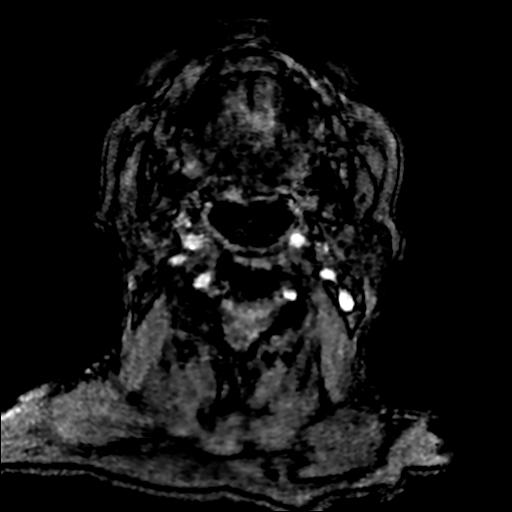
[im 11/248]
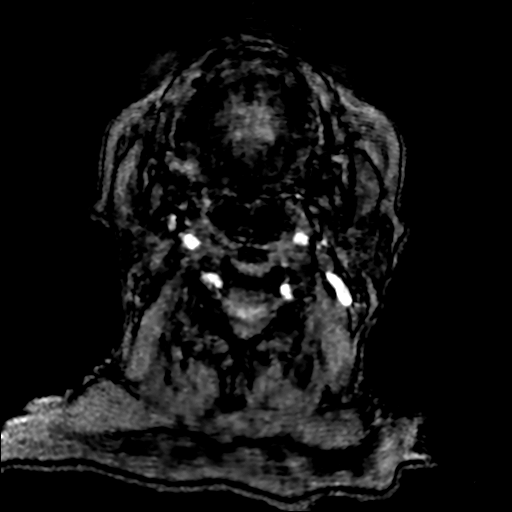
[im 16/248]
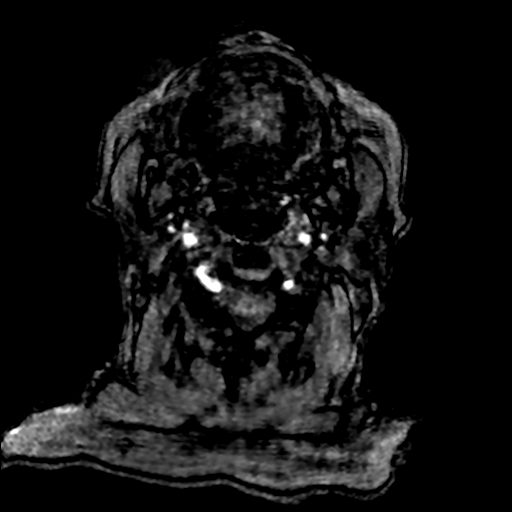
[im 22/248]
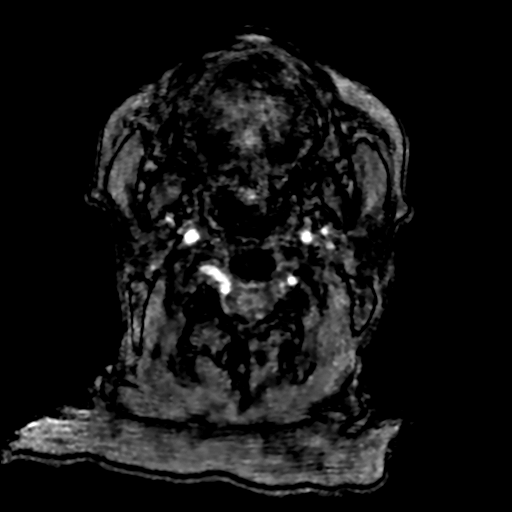
[im 27/248]
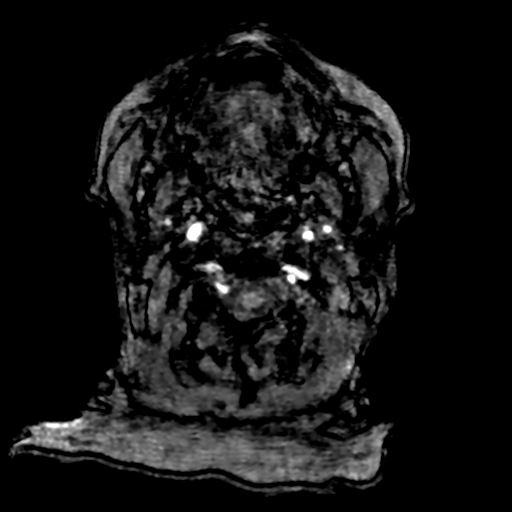
[im 32/248]
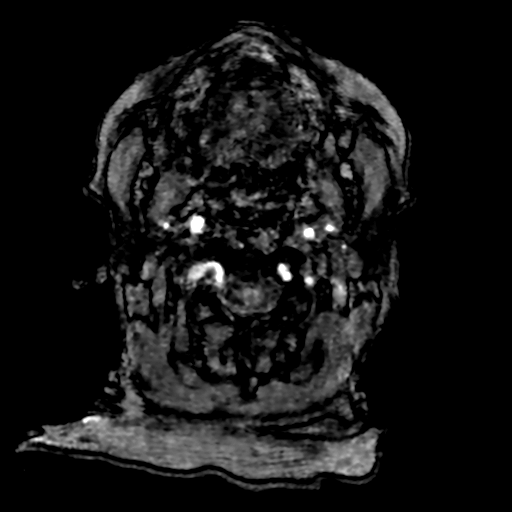
[im 43/248]
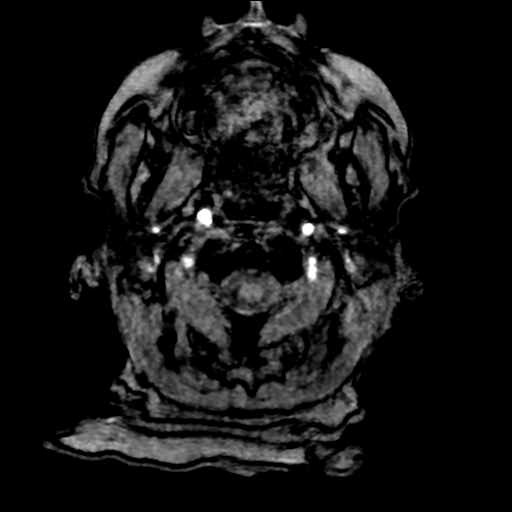
[im 48/248]
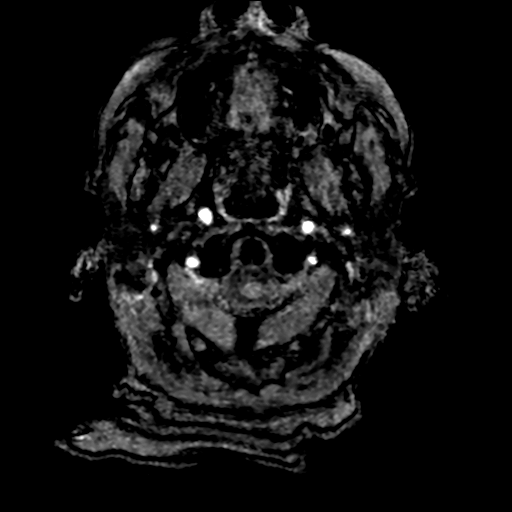
[im 79/248]
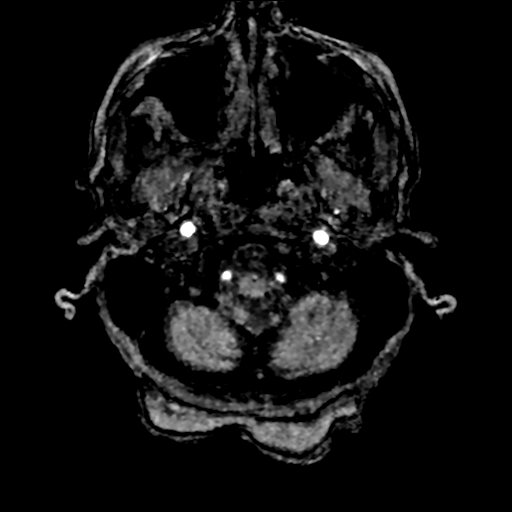
[im 111/248]
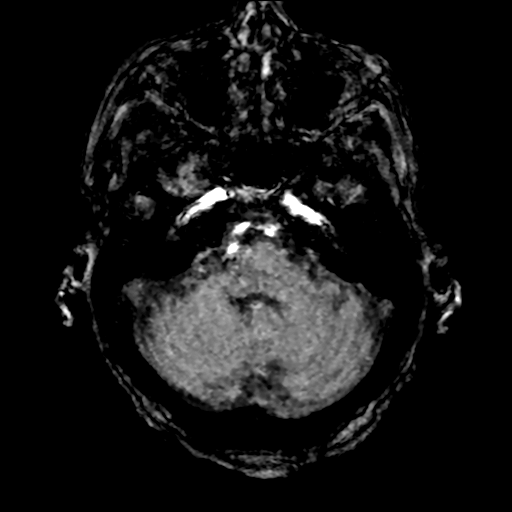
[im 127/248]
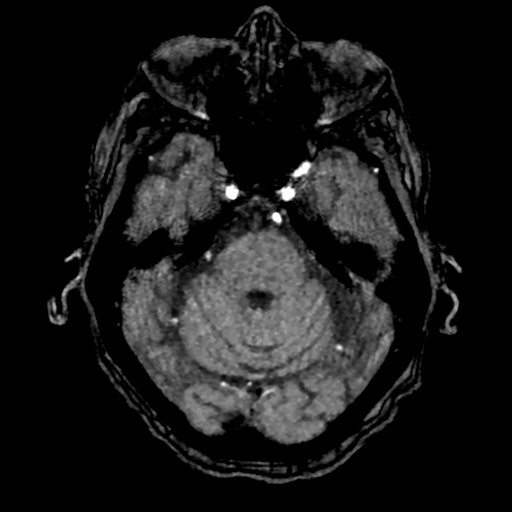
[im 142/248]
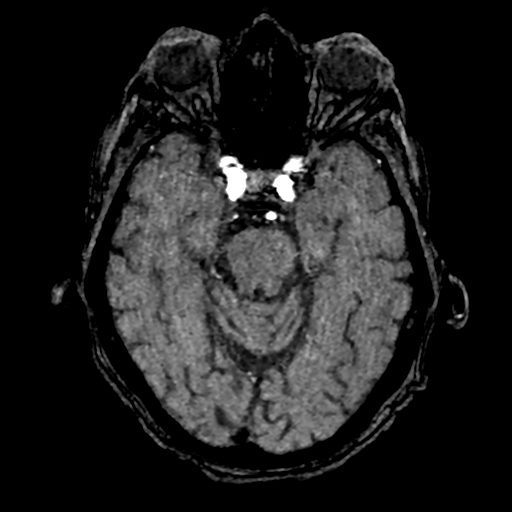
[im 174/248]
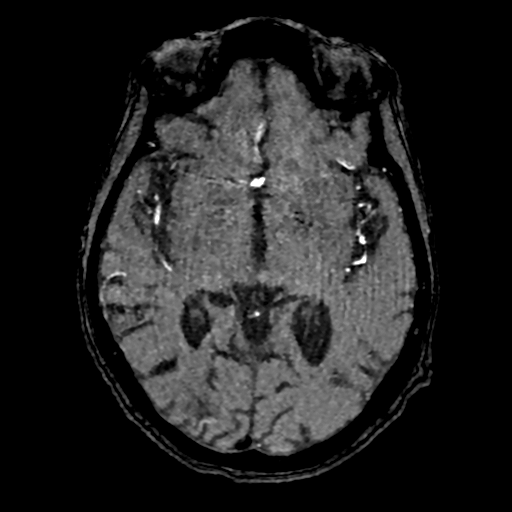
[im 205/248]
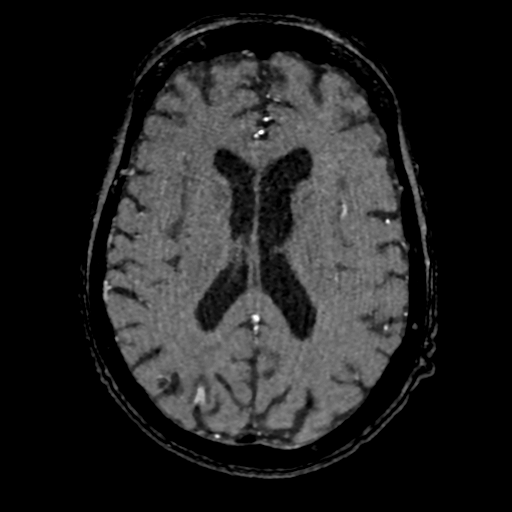
[im 211/248]
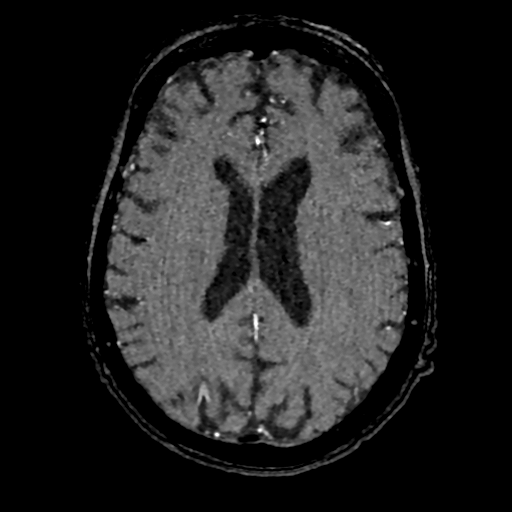
[im 237/248]
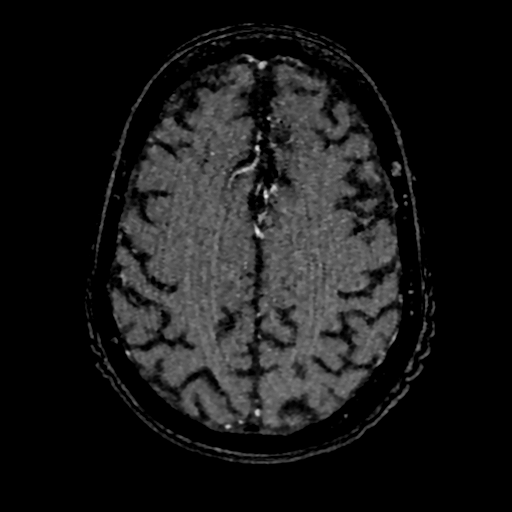

[17 of 48 positions shown; findings below may reference images not displayed]

FINDINGS: MRI HEAD FINDINGS

Brain: Diffuse prominence of the CSF containing spaces compatible
with generalized age-related cerebral atrophy. Patchy and confluent
T2/FLAIR hyperintensity within the periventricular and deep white
matter both cerebral hemispheres as well as the pons, most
consistent with chronic small vessel ischemic disease, moderate in
nature. Encephalomalacia and gliosis involving the right occipital
lobe consistent with a chronic right PCA distribution infarct,
corresponding with abnormality on prior CT. Associated mild
susceptibility artifact could be related to chronic hemosiderin
staining and/or laminar necrosis.

No abnormal foci of restricted diffusion to suggest acute or
subacute ischemia. Gray-white matter differentiation otherwise
maintained. No other areas of encephalomalacia to suggest chronic
cortical infarction. Single punctate chronic microhemorrhage noted
within the right cerebellum, of doubtful significance in isolation.
No acute intracranial hemorrhage.

No mass lesion, midline shift or mass effect. No hydrocephalus or
extra-axial fluid collection. Pituitary gland suprasellar region
within normal limits. Midline structures intact.

Vascular: Major intracranial vascular flow voids are maintained.

Skull and upper cervical spine: Craniocervical junction within
normal limits. Bone marrow signal intensity within normal limits. No
scalp soft tissue abnormality.

Sinuses/Orbits: Patient status post bilateral ocular lens
replacement. Globes and orbital soft tissues demonstrate no acute
finding. Mild scattered mucosal thickening noted within the
ethmoidal air cells. Paranasal sinuses are otherwise clear. No
mastoid effusion. Inner ear structures grossly normal.

Other: None.

MRA HEAD FINDINGS

Anterior circulation: Examination moderately degraded by motion
artifact.

Visualized distal cervical segments of the internal carotid arteries
are patent with antegrade flow. Petrous segments patent bilaterally.
Cavernous/supraclinoid segments are diffusely ectatic and tortuous
with diffuse atheromatous irregularity, but no definite
hemodynamically significant stenosis on this motion degraded exam.
A1 segments patent bilaterally. Normal anterior communicating artery
complex. Anterior cerebral arteries irregular but patent to their
distal aspects without stenosis. No significant M1 stenosis.
Negative MCA bifurcations. Distal MCA branches perfused and fairly
symmetric.

Posterior circulation: Visualized vertebral arteries are markedly
tortuous within the neck, particularly on the right. Vertebral
arteries are patent to the vertebrobasilar junction without
appreciable stenosis. Neither PICA well visualized on this motion
degraded exam. Basilar irregular but patent to its distal aspect
without high-grade stenosis. Superior cerebral arteries patent
bilaterally. Both PCAs primarily supplied via the basilar and are
grossly perfused to their distal aspects without appreciable
stenosis.

Anatomic variants: None significant.  No visible aneurysm.
IMPRESSION: MRI HEAD IMPRESSION:

1. No acute intracranial infarct or other abnormality.
2. Chronic right PCA distribution infarct, accounting for the
abnormality on prior CT.
3. Underlying age-related cerebral atrophy with moderate chronic
microvascular ischemic disease.

MRA HEAD IMPRESSION:

1. Motion degraded exam.
2. Negative MRA for large vessel occlusion.
3. Diffuse tortuosity with moderate atheromatous irregularity
throughout the intracranial circulation, but no definite proximal
high-grade or correctable stenosis.

## 2022-10-28 ENCOUNTER — Ambulatory Visit: Payer: Medicare Other | Attending: Cardiology

## 2022-10-28 DIAGNOSIS — Z951 Presence of aortocoronary bypass graft: Secondary | ICD-10-CM

## 2022-10-28 DIAGNOSIS — I361 Nonrheumatic tricuspid (valve) insufficiency: Secondary | ICD-10-CM | POA: Diagnosis not present

## 2022-10-28 DIAGNOSIS — I340 Nonrheumatic mitral (valve) insufficiency: Secondary | ICD-10-CM | POA: Diagnosis not present

## 2022-10-28 DIAGNOSIS — I352 Nonrheumatic aortic (valve) stenosis with insufficiency: Secondary | ICD-10-CM

## 2022-11-01 LAB — ECHOCARDIOGRAM COMPLETE
AR max vel: 1.38 cm2
AV Area VTI: 1.38 cm2
AV Area mean vel: 1.37 cm2
AV Mean grad: 15 mmHg
AV Peak grad: 26.7 mmHg
Ao pk vel: 2.58 m/s
Area-P 1/2: 5.02 cm2
Calc EF: 41.6 %
P 1/2 time: 377 msec
S' Lateral: 4.5 cm
Single Plane A2C EF: 39.4 %
Single Plane A4C EF: 45 %

## 2022-11-04 ENCOUNTER — Emergency Department: Payer: Medicare Other

## 2022-11-04 ENCOUNTER — Inpatient Hospital Stay: Payer: Medicare Other

## 2022-11-04 ENCOUNTER — Encounter: Payer: Self-pay | Admitting: Emergency Medicine

## 2022-11-04 ENCOUNTER — Other Ambulatory Visit: Payer: Self-pay

## 2022-11-04 ENCOUNTER — Inpatient Hospital Stay
Admission: EM | Admit: 2022-11-04 | Discharge: 2022-11-07 | DRG: 872 | Disposition: A | Discharge status: Hospice | Payer: Medicare Other | Attending: Obstetrics and Gynecology | Admitting: Obstetrics and Gynecology

## 2022-11-04 DIAGNOSIS — N179 Acute kidney failure, unspecified: Secondary | ICD-10-CM | POA: Diagnosis present

## 2022-11-04 DIAGNOSIS — L89311 Pressure ulcer of right buttock, stage 1: Secondary | ICD-10-CM | POA: Diagnosis present

## 2022-11-04 DIAGNOSIS — Z66 Do not resuscitate: Secondary | ICD-10-CM | POA: Diagnosis present

## 2022-11-04 DIAGNOSIS — E875 Hyperkalemia: Secondary | ICD-10-CM | POA: Diagnosis present

## 2022-11-04 DIAGNOSIS — I10 Essential (primary) hypertension: Secondary | ICD-10-CM | POA: Diagnosis not present

## 2022-11-04 DIAGNOSIS — L03115 Cellulitis of right lower limb: Secondary | ICD-10-CM | POA: Diagnosis not present

## 2022-11-04 DIAGNOSIS — Z8673 Personal history of transient ischemic attack (TIA), and cerebral infarction without residual deficits: Secondary | ICD-10-CM

## 2022-11-04 DIAGNOSIS — N1831 Chronic kidney disease, stage 3a: Secondary | ICD-10-CM | POA: Diagnosis not present

## 2022-11-04 DIAGNOSIS — R6520 Severe sepsis without septic shock: Secondary | ICD-10-CM | POA: Diagnosis present

## 2022-11-04 DIAGNOSIS — Z6825 Body mass index (BMI) 25.0-25.9, adult: Secondary | ICD-10-CM

## 2022-11-04 DIAGNOSIS — I130 Hypertensive heart and chronic kidney disease with heart failure and stage 1 through stage 4 chronic kidney disease, or unspecified chronic kidney disease: Secondary | ICD-10-CM | POA: Diagnosis present

## 2022-11-04 DIAGNOSIS — J9811 Atelectasis: Secondary | ICD-10-CM | POA: Diagnosis present

## 2022-11-04 DIAGNOSIS — Z79818 Long term (current) use of other agents affecting estrogen receptors and estrogen levels: Secondary | ICD-10-CM

## 2022-11-04 DIAGNOSIS — A419 Sepsis, unspecified organism: Secondary | ICD-10-CM | POA: Diagnosis present

## 2022-11-04 DIAGNOSIS — Z885 Allergy status to narcotic agent status: Secondary | ICD-10-CM

## 2022-11-04 DIAGNOSIS — R630 Anorexia: Secondary | ICD-10-CM | POA: Diagnosis present

## 2022-11-04 DIAGNOSIS — I890 Lymphedema, not elsewhere classified: Secondary | ICD-10-CM

## 2022-11-04 DIAGNOSIS — L8990 Pressure ulcer of unspecified site, unspecified stage: Secondary | ICD-10-CM | POA: Insufficient documentation

## 2022-11-04 DIAGNOSIS — R5381 Other malaise: Secondary | ICD-10-CM | POA: Diagnosis present

## 2022-11-04 DIAGNOSIS — Z87891 Personal history of nicotine dependence: Secondary | ICD-10-CM

## 2022-11-04 DIAGNOSIS — I509 Heart failure, unspecified: Secondary | ICD-10-CM | POA: Diagnosis present

## 2022-11-04 DIAGNOSIS — N1832 Chronic kidney disease, stage 3b: Secondary | ICD-10-CM

## 2022-11-04 DIAGNOSIS — Z9842 Cataract extraction status, left eye: Secondary | ICD-10-CM

## 2022-11-04 DIAGNOSIS — Z961 Presence of intraocular lens: Secondary | ICD-10-CM | POA: Diagnosis present

## 2022-11-04 DIAGNOSIS — Z7982 Long term (current) use of aspirin: Secondary | ICD-10-CM

## 2022-11-04 DIAGNOSIS — I482 Chronic atrial fibrillation, unspecified: Secondary | ICD-10-CM | POA: Diagnosis present

## 2022-11-04 DIAGNOSIS — N184 Chronic kidney disease, stage 4 (severe): Secondary | ICD-10-CM | POA: Diagnosis present

## 2022-11-04 DIAGNOSIS — L02415 Cutaneous abscess of right lower limb: Secondary | ICD-10-CM | POA: Diagnosis present

## 2022-11-04 DIAGNOSIS — L0390 Cellulitis, unspecified: Secondary | ICD-10-CM | POA: Diagnosis present

## 2022-11-04 DIAGNOSIS — D631 Anemia in chronic kidney disease: Secondary | ICD-10-CM | POA: Diagnosis present

## 2022-11-04 DIAGNOSIS — Z951 Presence of aortocoronary bypass graft: Secondary | ICD-10-CM

## 2022-11-04 DIAGNOSIS — L02419 Cutaneous abscess of limb, unspecified: Secondary | ICD-10-CM

## 2022-11-04 DIAGNOSIS — M7989 Other specified soft tissue disorders: Secondary | ICD-10-CM | POA: Diagnosis present

## 2022-11-04 DIAGNOSIS — Z9841 Cataract extraction status, right eye: Secondary | ICD-10-CM

## 2022-11-04 DIAGNOSIS — Z888 Allergy status to other drugs, medicaments and biological substances status: Secondary | ICD-10-CM

## 2022-11-04 DIAGNOSIS — D649 Anemia, unspecified: Secondary | ICD-10-CM | POA: Diagnosis present

## 2022-11-04 DIAGNOSIS — R400 Somnolence: Secondary | ICD-10-CM | POA: Diagnosis not present

## 2022-11-04 DIAGNOSIS — Z515 Encounter for palliative care: Secondary | ICD-10-CM | POA: Diagnosis not present

## 2022-11-04 DIAGNOSIS — I2510 Atherosclerotic heart disease of native coronary artery without angina pectoris: Secondary | ICD-10-CM | POA: Diagnosis present

## 2022-11-04 DIAGNOSIS — M1990 Unspecified osteoarthritis, unspecified site: Secondary | ICD-10-CM | POA: Diagnosis present

## 2022-11-04 DIAGNOSIS — Z90710 Acquired absence of both cervix and uterus: Secondary | ICD-10-CM | POA: Diagnosis not present

## 2022-11-04 DIAGNOSIS — E785 Hyperlipidemia, unspecified: Secondary | ICD-10-CM | POA: Diagnosis present

## 2022-11-04 DIAGNOSIS — H409 Unspecified glaucoma: Secondary | ICD-10-CM | POA: Diagnosis present

## 2022-11-04 DIAGNOSIS — Z79899 Other long term (current) drug therapy: Secondary | ICD-10-CM

## 2022-11-04 DIAGNOSIS — L89321 Pressure ulcer of left buttock, stage 1: Secondary | ICD-10-CM | POA: Diagnosis present

## 2022-11-04 LAB — CBC WITH DIFFERENTIAL/PLATELET
Abs Immature Granulocytes: 0.65 10*3/uL — ABNORMAL HIGH (ref 0.00–0.07)
Basophils Absolute: 0.1 10*3/uL (ref 0.0–0.1)
Basophils Relative: 1 %
Eosinophils Absolute: 0.1 10*3/uL (ref 0.0–0.5)
Eosinophils Relative: 1 %
HCT: 30.4 % — ABNORMAL LOW (ref 36.0–46.0)
Hemoglobin: 9.2 g/dL — ABNORMAL LOW (ref 12.0–15.0)
Immature Granulocytes: 4 %
Lymphocytes Relative: 4 %
Lymphs Abs: 0.7 10*3/uL (ref 0.7–4.0)
MCH: 29.6 pg (ref 26.0–34.0)
MCHC: 30.3 g/dL (ref 30.0–36.0)
MCV: 97.7 fL (ref 80.0–100.0)
Monocytes Absolute: 1 10*3/uL (ref 0.1–1.0)
Monocytes Relative: 6 %
Neutro Abs: 13.1 10*3/uL — ABNORMAL HIGH (ref 1.7–7.7)
Neutrophils Relative %: 84 %
Platelets: 262 10*3/uL (ref 150–400)
RBC: 3.11 MIL/uL — ABNORMAL LOW (ref 3.87–5.11)
RDW: 13.8 % (ref 11.5–15.5)
WBC: 15.6 10*3/uL — ABNORMAL HIGH (ref 4.0–10.5)
nRBC: 0.7 % — ABNORMAL HIGH (ref 0.0–0.2)

## 2022-11-04 LAB — COMPREHENSIVE METABOLIC PANEL
ALT: 9 U/L (ref 0–44)
AST: 31 U/L (ref 15–41)
Albumin: 2.5 g/dL — ABNORMAL LOW (ref 3.5–5.0)
Alkaline Phosphatase: 87 U/L (ref 38–126)
Anion gap: 13 (ref 5–15)
BUN: 59 mg/dL — ABNORMAL HIGH (ref 8–23)
CO2: 24 mmol/L (ref 22–32)
Calcium: 8.5 mg/dL — ABNORMAL LOW (ref 8.9–10.3)
Chloride: 103 mmol/L (ref 98–111)
Creatinine, Ser: 2.54 mg/dL — ABNORMAL HIGH (ref 0.44–1.00)
GFR, Estimated: 17 mL/min — ABNORMAL LOW (ref >60)
Glucose, Bld: 113 mg/dL — ABNORMAL HIGH (ref 70–99)
Potassium: 5.4 mmol/L — ABNORMAL HIGH (ref 3.5–5.1)
Sodium: 140 mmol/L (ref 135–145)
Total Bilirubin: 0.9 mg/dL (ref 0.3–1.2)
Total Protein: 6.7 g/dL (ref 6.5–8.1)

## 2022-11-04 LAB — LACTIC ACID, PLASMA
Lactic Acid, Venous: 2.4 mmol/L (ref 0.5–1.9)
Lactic Acid, Venous: 1.7 mmol/L (ref 0.5–1.9)

## 2022-11-04 LAB — PROTIME-INR
INR: 1.2 (ref 0.8–1.2)
Prothrombin Time: 15.2 seconds (ref 11.4–15.2)

## 2022-11-04 MED ORDER — SODIUM CHLORIDE 0.9 % IV SOLN
500.0000 mg | INTRAVENOUS | Status: DC
  Administered 2022-11-04 – 2022-11-05 (×2): 500 mg via INTRAVENOUS
  Filled 2022-11-04 (×2): qty 5
  Filled 2022-11-04: qty 500
  Filled 2022-11-04: qty 5

## 2022-11-04 MED ORDER — MORPHINE SULFATE (PF) 2 MG/ML IV SOLN
2.0000 mg | INTRAVENOUS | Status: DC | PRN
  Administered 2022-11-04 – 2022-11-06 (×7): 2 mg via INTRAVENOUS
  Filled 2022-11-04 (×8): qty 1

## 2022-11-04 MED ORDER — DEXTROSE 5 % IV SOLN
250.0000 mg | Freq: Every day | INTRAVENOUS | Status: DC
  Filled 2022-11-04: qty 2.5

## 2022-11-04 MED ORDER — ONDANSETRON HCL 4 MG PO TABS: 4.0000 mg | ORAL_TABLET | Freq: Four times a day (QID) | ORAL | Status: DC | PRN

## 2022-11-04 MED ORDER — SODIUM CHLORIDE 0.9 % IV BOLUS
1000.0000 mL | Freq: Once | INTRAVENOUS | Status: AC
  Administered 2022-11-04: 1000 mL via INTRAVENOUS

## 2022-11-04 MED ORDER — ENOXAPARIN SODIUM 40 MG/0.4ML IJ SOSY
40.0000 mg | PREFILLED_SYRINGE | INTRAMUSCULAR | Status: DC
  Administered 2022-11-04 – 2022-11-05 (×2): 40 mg via SUBCUTANEOUS
  Filled 2022-11-04 (×2): qty 0.4

## 2022-11-04 MED ORDER — METRONIDAZOLE 500 MG/100ML IV SOLN
500.0000 mg | Freq: Once | INTRAVENOUS | Status: AC
  Administered 2022-11-04: 500 mg via INTRAVENOUS
  Filled 2022-11-04: qty 100

## 2022-11-04 MED ORDER — VANCOMYCIN VARIABLE DOSE PER UNSTABLE RENAL FUNCTION (PHARMACIST DOSING): Status: DC

## 2022-11-04 MED ORDER — SODIUM CHLORIDE 0.9 % IV SOLN: INTRAVENOUS | Status: DC

## 2022-11-04 MED ORDER — VANCOMYCIN HCL IN DEXTROSE 1-5 GM/200ML-% IV SOLN
1000.0000 mg | Freq: Once | INTRAVENOUS | Status: AC
  Administered 2022-11-04: 1000 mg via INTRAVENOUS
  Filled 2022-11-04: qty 200

## 2022-11-04 MED ORDER — LORAZEPAM 0.5 MG PO TABS: 0.5000 mg | ORAL_TABLET | Freq: Once | ORAL | Status: DC | PRN

## 2022-11-04 MED ORDER — SODIUM CHLORIDE 0.9 % IV SOLN
2.0000 g | INTRAVENOUS | Status: DC
  Filled 2022-11-04 (×2): qty 12.5

## 2022-11-04 MED ORDER — METOPROLOL SUCCINATE ER 25 MG PO TB24
25.0000 mg | ORAL_TABLET | Freq: Every day | ORAL | Status: DC
  Administered 2022-11-04 – 2022-11-07 (×3): 25 mg via ORAL
  Filled 2022-11-04 (×3): qty 1

## 2022-11-04 MED ORDER — SODIUM ZIRCONIUM CYCLOSILICATE 10 G PO PACK
10.0000 g | PACK | Freq: Every day | ORAL | Status: DC
  Administered 2022-11-04 – 2022-11-06 (×2): 10 g via ORAL
  Filled 2022-11-04 (×4): qty 1

## 2022-11-04 MED ORDER — ONDANSETRON HCL 4 MG/2ML IJ SOLN: 4.0000 mg | Freq: Four times a day (QID) | INTRAMUSCULAR | Status: DC | PRN

## 2022-11-04 MED ORDER — SODIUM CHLORIDE 0.9 % IV SOLN
2.0000 g | Freq: Once | INTRAVENOUS | Status: AC
  Administered 2022-11-04: 2 g via INTRAVENOUS
  Filled 2022-11-04: qty 12.5

## 2022-11-04 MED ORDER — ENSURE ENLIVE PO LIQD
237.0000 mL | Freq: Two times a day (BID) | ORAL | Status: DC
  Administered 2022-11-05 – 2022-11-07 (×3): 237 mL via ORAL

## 2022-11-04 NOTE — Assessment & Plan Note (Addendum)
+   RLE cellulitis on presentation  WBC 15.6  Started on IV cefepime and vancomycin for infectious coverage  Blood cultures drawn in ER  RLE u/s pending  Will have area formally marked and dated Noted posterior calf deep tissue wound/opening  Will check MRI RLE to r/o deep tissue infection given WBC count  Follow

## 2022-11-04 NOTE — Assessment & Plan Note (Signed)
Hgb 9 on presentation  Baseline hgb 9-10  Will monitor

## 2022-11-04 NOTE — ED Triage Notes (Signed)
Patient sent from PCP for a wound on the back of right leg. Per daughter, both legs had been getting wrapped by home health RN but today notice a "hole". Sent for further evaluation.

## 2022-11-04 NOTE — ED Provider Notes (Signed)
[facility] Provider Note    Event Date/Time   First MD Initiated Contact with Patient 11/04/22 1314     (approximate)   History   Chief Complaint: Wound Infection   HPI  Hayley Maynard is a 89 y.o. female with a history of CAD, CKD, prior stroke, CHF who is brought to the ED due to redness and swelling of the right lower leg.  She has been getting home wound care for lower extremity edema for several weeks, but it has been slowly worsening, and now she has an open wound on the back of her right lower leg.  They went to primary care and were sent to the ED for further evaluation.  No fever.  No chest pain or shortness of breath.  No falls or trauma.  Chronically poor appetite.     Physical Exam   Triage Vital Signs: ED Triage Vitals  Enc Vitals Group     BP 11/04/22 1301 (!) 150/93     Pulse Rate 11/04/22 1301 (!) 127     Resp 11/04/22 1301 (!) 22     Temp 11/04/22 1301 98.2 F (36.8 C)     Temp Source 11/04/22 1301 Oral     SpO2 11/04/22 1301 91 %     Weight --      Height --      Head Circumference --      Peak Flow --      Pain Score 11/04/22 1259 8     Pain Loc --      Pain Edu? --      Excl. in [place]? --     Most recent vital signs: Vitals:   11/04/22 1301  BP: (!) 150/93  Pulse: (!) 127  Resp: (!) 22  Temp: 98.2 F (36.8 C)  SpO2: 91%    General: Awake, no distress.  CV:  Good peripheral perfusion.  Irregular rhythm, heart rate 110-130 Resp:  Normal effort.  Mild tachypnea.  Clear to auscultation bilaterally Abd:  No distention.  Soft nontender Other:  2+ pitting edema left lower extremity.  3+ pitting edema right lower extremity with maceration of the skin overlying the distal calf on through the plantar midfoot.  There is soft tissue breakdown and open wound with some purulent drainage overlying the distal fibula.  There is erythema and tenderness throughout this area.  No crepitus.   ED Results / Procedures / Treatments    Labs (all labs ordered are listed, but only abnormal results are displayed) Labs Reviewed  COMPREHENSIVE METABOLIC PANEL - Abnormal; Notable for the following components:      Result Value   Potassium 5.4 (*)    Glucose, Bld 113 (*)    BUN 59 (*)    Creatinine, Ser 2.54 (*)    Calcium 8.5 (*)    Albumin 2.5 (*)    GFR, Estimated 17 (*)    All other components within normal limits  LACTIC ACID, PLASMA - Abnormal; Notable for the following components:   Lactic Acid, Venous 2.4 (*)    All other components within normal limits  CBC WITH DIFFERENTIAL/PLATELET - Abnormal; Notable for the following components:   WBC 15.6 (*)    RBC 3.11 (*)    Hemoglobin 9.2 (*)    HCT 30.4 (*)    nRBC 0.7 (*)    Neutro Abs 13.1 (*)    Abs Immature Granulocytes 0.65 (*)    All other components within normal limits  CULTURE, BLOOD (ROUTINE X 2)  CULTURE, BLOOD (ROUTINE X 2)  PROTIME-INR  URINALYSIS, ROUTINE W REFLEX MICROSCOPIC  LACTIC ACID, PLASMA     EKG Interpreted by me Atrial fibrillation, rate of 131.  Right axis, normal intervals.  Poor R wave progression.  Normal ST segments.  Lateral T wave inversions.  No significant change compared to prior EKG on September 21, 2022.   RADIOLOGY X-ray right tibia and fibula interpreted by me, appears unremarkable.  Radiology report reviewed.  Ultrasound right lower extremity interpreted by me, shows partial occlusion with eccentrically located thrombus along the vessel wall consistent with chronic recanalized DVT.  Radiology report reviewed  Chest x-ray interpreted by me, shows left-sided pleural effusion.  Radiology report reviewed  PROCEDURES:  .Critical Care  Performed by: Carrie Mew, MD Authorized by: Carrie Mew, MD   Critical care provider statement:    Critical care time (minutes):  35   Critical care time was exclusive of:  Separately billable procedures and treating other patients   Critical care was necessary to treat  or prevent imminent or life-threatening deterioration of the following conditions:  Sepsis   Critical care was time spent personally by me on the following activities:  Development of treatment plan with patient or surrogate, discussions with consultants, evaluation of patient's response to treatment, examination of patient, obtaining history from patient or surrogate, ordering and performing treatments and interventions, ordering and review of laboratory studies, ordering and review of radiographic studies, pulse oximetry, re-evaluation of patient's condition and review of old charts   Care discussed with: admitting provider   Comments:        .1-3 Lead EKG Interpretation  Performed by: Carrie Mew, MD Authorized by: Carrie Mew, MD     Interpretation: abnormal     ECG rate:  125   ECG rate assessment: tachycardic     Rhythm: atrial fibrillation     Ectopy: none     Conduction: normal      MEDICATIONS ORDERED IN ED: Medications  metroNIDAZOLE (FLAGYL) IVPB 500 mg (500 mg Intravenous New Bag/Given 11/04/22 1404)  vancomycin (VANCOCIN) IVPB 1000 mg/200 mL premix (1,000 mg Intravenous New Bag/Given 11/04/22 1409)  sodium chloride 0.9 % bolus 1,000 mL (1,000 mLs Intravenous New Bag/Given 11/04/22 1350)  ceFEPIme (MAXIPIME) 2 g in sodium chloride 0.9 % 100 mL IVPB (0 g Intravenous Stopped 11/04/22 1409)     IMPRESSION / MDM / [place] / ED COURSE  I reviewed the triage vital signs and the nursing notes.  DDx: Right leg cellulitis, osteomyelitis, DVT, electrolyte abnormality, anemia, hypoalbuminemia.  Doubt necrotizing fasciitis  Patient's presentation is most consistent with acute presentation with potential threat to life or bodily function.  Patient presents with worsening right leg pain and swelling.  Soft tissue has extensive maceration and skin sloughing.  She is tachycardic and tachypneic.  Will start sepsis workup, give cefepime and vancomycin for antibiotic  coverage.  Will plan to hospitalize for further management.   ----------------------------------------- 2:50 PM on 11/04/2022 ----------------------------------------- Labs show leukocytosis of 15,000, lactic acid 2.4.  Creatinine is approximately baseline CKD with a GFR of 17.  Patient is receiving 1 L IV fluids, will recheck lactic acid.  No signs of septic shock.  Case discussed with hospitalist for further management.      FINAL CLINICAL IMPRESSION(S) / ED DIAGNOSES   Final diagnoses:  Cellulitis of right leg  Chronic atrial fibrillation (HCC)  Sepsis without acute organ dysfunction, due to unspecified organism ([place])  Rx / DC Orders   ED Discharge Orders     None        Note:  This document was prepared using Dragon voice recognition software and may include unintentional dictation errors.   Carrie Mew, MD 11/04/22 1451

## 2022-11-04 NOTE — Assessment & Plan Note (Signed)
Meet sepsis criteria based on heart rate in 100s as well as white blood cell count 15.6 Noted likely soft tissue source with right lower extremity cellulitis Some?  Atelectasis versus pneumonia on chest x-ray though with no hypoxia present Afebrile Pancultured in the ER Lactate at 2.4 Started on cefepime, Flagyl and vancomycin in the ER Will place on cefepime, azithromycin and vancomycin for soft tissue and respiratory coverage for now pending MRI of the right lower extremity to rule out deep tissue infection given white count and noted calf wound BP stable at present Trend lactate Gentle hydration-monitor volume status Follow

## 2022-11-04 NOTE — Consult Note (Signed)
PHARMACY -  BRIEF ANTIBIOTIC NOTE   Pharmacy has received consult(s) for Vancomycin and Cefepime from an ED provider.  The patient's profile has been reviewed for ht/wt/allergies/indication/available labs.    One time order(s) placed for Vancomycin 1g IV and Cefepime 2g IV x 1 dose each.  Further antibiotics/pharmacy consults should be ordered by admitting physician if indicated.                       Thank you, Pearla Dubonnet 11/04/2022  1:55 PM

## 2022-11-04 NOTE — ED Notes (Signed)
Critical Results Lactic Acid 2.4 reported by Nevin Bloodgood MD notified

## 2022-11-04 NOTE — Consult Note (Signed)
Pharmacy Antibiotic Note  Hayley Maynard is a 89 y.o. female admitted on 11/04/2022 with RLE cellulitis. PMH significant for CAD, CKD3, prior stroke, CHF. Per patient's daughter, patient has been having worsening lymphedema predominate in the right leg. Despite having her leg wrapped by home health nursing for several weeks, redness and swelling has gotten worse. No IV antibiotic use within past year per chart review. Pharmacy has been consulted for vancomycin and cefepime dosing.  Plan: Day 1 of antibiotics Give vancomycin 1000 mg IV x1. Check random vanc level tomorrow given AKI (SCr 2.54, baseline ~2 as of 08/2022)  Start cefepime 2 g IV Q24H Dosing based off of eGFR of 17 as current weight and CrCl are unavailable Patient is also on azithromycin 500 mg IV Q24H Continue to monitor renal function and follow culture results    Temp (24hrs), Avg:98.2 F (36.8 C), Min:98.2 F (36.8 C), Max:98.2 F (36.8 C)  Recent Labs  Lab 11/04/22 1324  WBC 15.6*  CREATININE 2.54*  LATICACIDVEN 2.4*    CrCl cannot be calculated (Unknown ideal weight.).    Allergies  Allergen Reactions   Propoxyphene Other (See Comments)   Telmisartan-Hctz Other (See Comments)    Antimicrobials this admission: 3/15 Cefepime >>  3/15 Vancomycin >>  3/15 Azithromycin >>  Dose adjustments this admission: N/A  Microbiology results: 3/15 BCx: IP  Thank you for allowing pharmacy to be a part of this patient's care.  Gretel Acre, PharmD PGY1 Pharmacy Resident 11/04/2022 7:26 PM

## 2022-11-04 NOTE — Assessment & Plan Note (Signed)
K5.4 today with no peaked T waves on EKG Recurring issue in the setting of stage III-IV CKD Will continue home [facility] Monitor

## 2022-11-04 NOTE — Assessment & Plan Note (Addendum)
Chronic lymphedema w/ superimposed acute cellulitis on distal RLE   S/p course of compression dressings outpt  Will cont w/ compression dressings on LLE  Treat cellulitis on RLE  Wound care consult  Follow

## 2022-11-04 NOTE — Assessment & Plan Note (Addendum)
Presenting w/ Cr. 2.5 today w/ baseline Cr around 2  Mildly dry though w/ chronic lymphedema  Will hold on nephrotoxic agents for now  Gentle IVF hydration  Monitor renal function

## 2022-11-04 NOTE — Assessment & Plan Note (Signed)
BP stable Titrate home regimen 

## 2022-11-04 NOTE — H&P (Signed)
History and Physical    Patient: Hayley Maynard [id number] DOB: 10-21-1921 DOA: 11/04/2022 DOS: the patient was seen and examined on 11/04/2022 PCP: Maryland Pink, MD  Patient coming from: Home  Chief Complaint:  Chief Complaint  Patient presents with   Wound Infection   HPI: Hayley Maynard is a 89 y.o. female with medical history significant of Stage III chronic kidney disease, CAD, hypertension, dyslipidemia and osteoarthritis presenting w/ RLE cellulitis, leukocytosis.  History primarily from patient's daughter [name].  Per report, patient has been having worsening lymphedema predominate in the right leg.  Area has been getting wrapped by home health nursing for several weeks.  However swelling redness have only gotten worse.  No fevers or chills.  No nausea or vomiting.  Denies any actual trauma to the area.  Patient currently lives at home with her daughter with home health nursing coming in regularly.  No recent changes in medication.  Noticed worsening weeping.  Per the daughter, she took her mother to be evaluated at her PCPs office who recommended ER evaluation.  No significant medication changes noted. Presents to the ER afebrile, heart rate into the 120s, BP stable.  Satting well on room air.  Labs normal for white count of 15.6, hemoglobin 9.2 creatinine 2.54 with baseline creatinine around 2, potassium of 5.4, lactate of 2.4.  Right tib-fib grossly stable.  Chest x-ray with increasing left pleural effusion and left lower lung consolidation/atelectasis. Review of Systems: As mentioned in the history of present illness. All other systems reviewed and are negative. Past Medical History:  Diagnosis Date   Arthritis    feet   CKD (chronic kidney disease), stage III ([place])    Coronary artery disease    Glaucoma    Hyperlipidemia    Hypertension    LVH (left ventricular hypertrophy)    Wears dentures    full upper and lower   Past Surgical History:  Procedure Laterality Date    ABDOMINAL HYSTERECTOMY     CATARACT EXTRACTION W/ INTRAOCULAR LENS  IMPLANT, BILATERAL     CORONARY ARTERY BYPASS GRAFT     over 10 yrs ago (per pt)   INTRAMEDULLARY (IM) NAIL INTERTROCHANTERIC Right 03/20/2020   Procedure: INTRAMEDULLARY (IM) NAIL INTERTROCHANTRIC;  Surgeon: Hessie Knows, MD;  Location: [facility] ORS;  Service: Orthopedics;  Laterality: Right;   PHOTOCOAGULATION WITH LASER Right 09/26/2018   Procedure: PHOTOCOAGULATION WITH LASER;  Surgeon: Leandrew Koyanagi, MD;  Location: [place];  Service: Ophthalmology;  Laterality: Right;  laser settings: 2079mW, 31.3% duty cycle, 120 seconds   Social History:  reports that she quit smoking about 2 years ago. Her smoking use included cigarettes. She has a 10.00 pack-year smoking history. She has never used smokeless tobacco. She reports that she does not currently use alcohol. She reports that she does not currently use drugs.  Allergies  Allergen Reactions   Propoxyphene Other (See Comments)   Telmisartan-Hctz Other (See Comments)    History reviewed. No pertinent family history.  Prior to Admission medications   Medication Sig Start Date End Date Taking? Authorizing Provider  amLODipine (NORVASC) 10 MG tablet Take 10 mg by mouth daily. 10/29/22  Yes [provider]  lactulose (CHRONULAC) 10 GM/15ML solution Take 10 g by mouth 2 (two) times daily as needed. Take 15 mLs by mouth 2 (two) times daily as needed for Constipation for up to 10 days 10/02/22  Yes [provider]  losartan (COZAAR) 50 MG tablet Take 50 mg by mouth daily.  10/29/22  Yes [provider]  metolazone (ZAROXOLYN) 2.5 MG tablet Take 2.5 mg by mouth 2 (two) times daily. 10/06/22 10/06/23 Yes [provider]  simvastatin (ZOCOR) 20 MG tablet Take 20 mg by mouth at bedtime. 10/10/22  Yes [provider]  acetaminophen (TYLENOL) 325 MG tablet Take 1-2 tablets (325-650 mg total) by mouth every 6 (six) hours as needed for mild  pain, fever or headache. 09/03/22   Emeterio Reeve, DO  aspirin EC 81 MG tablet Take 1 tablet (81 mg total) by mouth daily. Swallow whole. 09/21/22   Kate Sable, MD  brimonidine (ALPHAGAN) 0.2 % ophthalmic solution Place 1 drop into both eyes in the morning and at bedtime.     [provider]  dorzolamide-timolol (COSOPT) 22.3-6.8 MG/ML ophthalmic solution Place 1 drop into both eyes 2 (two) times daily. 04/29/19   [provider]  feeding supplement (ENSURE ENLIVE / ENSURE PLUS) LIQD Take 237 mLs by mouth 2 (two) times daily between meals. 09/03/22   Emeterio Reeve, DO  ferrous [id number] C-folic acid (TRINSICON / FOLTRIN) capsule Take 1 capsule by mouth 2 (two) times daily after a meal. 03/24/20   Georgette Shell, MD  hydrALAZINE (APRESOLINE) 50 MG tablet Take 1 tablet by mouth 2 (two) times daily. 09/02/19   [provider]  latanoprost (XALATAN) 0.005 % ophthalmic solution Place 1 drop into both eyes at bedtime. 04/28/20   [provider]  megestrol (MEGACE) 40 MG/ML suspension Take 800 mg by mouth daily. 06/13/22 06/13/23  [provider]  metoprolol succinate (TOPROL XL) 25 MG 24 hr tablet Take 1 tablet (25 mg total) by mouth daily. 09/21/22   Kate Sable, MD  sodium zirconium cyclosilicate (LOKELMA) 10 g PACK packet Take 10 g by mouth daily. 09/04/22   Emeterio Reeve, DO  torsemide (DEMADEX) 20 MG tablet Take 1 tablet (20 mg total) by mouth 2 (two) times daily. 09/21/22 09/16/23  Kate Sable, MD  vitamin B-12 (CYANOCOBALAMIN) 1000 MCG tablet Take 1,000 mcg by mouth daily.    [provider]    Physical Exam: Vitals:   11/04/22 1301  BP: (!) 150/93  Pulse: (!) 127  Resp: (!) 22  Temp: 98.2 F (36.8 C)  TempSrc: Oral  SpO2: 91%   Physical Exam Constitutional:      General: She is not in acute distress.    Comments: Frail  Underweight   HENT:     Head: Normocephalic.     Nose: Nose normal.   Eyes:     Pupils: Pupils are equal, round, and reactive to light.  Cardiovascular:     Rate and Rhythm: Normal rate.  Pulmonary:     Effort: Pulmonary effort is normal.  Abdominal:     General: Abdomen is flat.  Musculoskeletal:     Comments: Marked right lower extremity redness and swelling predominantly over the anterior ankle Noted deep tissue wound on the medial calf posteriorly  Skin:    Comments: As noted above in musculoskeletal portion  Neurological:     General: No focal deficit present.  Psychiatric:        Mood and Affect: Mood normal.     Data Reviewed:  There are no new results to review at this time. US Venous Img Lower Unilateral Right CLINICAL DATA:  Right lower extremity pain and swelling  EXAM: Right LOWER EXTREMITY VENOUS DOPPLER ULTRASOUND  TECHNIQUE: Gray-scale sonography with graded compression, as well as color Doppler and duplex ultrasound were performed to evaluate  the lower extremity deep venous systems from the level of the common femoral vein and including the common femoral, femoral, profunda femoral, popliteal and calf veins including the posterior tibial, peroneal and gastrocnemius veins when visible. The superficial great saphenous vein was also interrogated. Spectral Doppler was utilized to evaluate flow at rest and with distal augmentation maneuvers in the common femoral, femoral and popliteal veins.  COMPARISON:  11/04/2022 radiograph  FINDINGS: Contralateral Common Femoral Vein: Respiratory phasicity is normal and symmetric with the symptomatic side. No evidence of thrombus. Normal compressibility.  Common Femoral Vein: No evidence of thrombus. Normal compressibility, respiratory phasicity and response to augmentation.  Saphenofemoral Junction: Positive for nonocclusive thrombus. Partial compressibility.  Profunda Femoral Vein: No evidence of thrombus. Normal compressibility and flow on color Doppler imaging.  Femoral Vein:  No evidence of thrombus. Normal compressibility, respiratory phasicity and response to augmentation.  Popliteal Vein: No evidence of thrombus. Normal compressibility, respiratory phasicity and response to augmentation.  Calf Veins: No evidence of thrombus. Normal compressibility and flow on color Doppler imaging.  Superficial Great Saphenous Vein: Positive for nonocclusive thrombus within the proximal saphenous vein near its confluence with the common femoral vein.  IMPRESSION: 1. Technically negative for acute DVT, however there is nonocclusive acute thrombus visualized within the proximal great saphenous vein and saphenofemoral junction.  Electronically Signed   By: Donavan Foil M.D.   On: 11/04/2022 15:32 DG Tibia/Fibula Right CLINICAL DATA:  distal tib/fib pain, swelling  EXAM: RIGHT TIBIA AND FIBULA - 2 VIEW  COMPARISON:  None Available.  FINDINGS: Distal aspect of femoral IM rod is partially visualized. Diffuse osteopenia. Negative for fracture or dislocation. Patchy arterial calcifications.  IMPRESSION: Negative for fracture or other acute finding.  Electronically Signed   By: Lucrezia Europe M.D.   On: 11/04/2022 14:15 DG Chest Port 1 View CLINICAL DATA:  Patient sent from PCP for a wound on the back of right leg. Per daughter, both legs had been getting wrapped by home health RN but today notice a "hole". Sent for further evaluation.  EXAM: PORTABLE CHEST - 1 VIEW  COMPARISON:  08/31/2022  FINDINGS: Interval increase in left pleural effusion and left lower lung consolidation/atelectasis. Right lung clear.  Heart size and mediastinal contours are within normal limits. Aortic Atherosclerosis (ICD10-170.0). CABG markers.  Sternotomy wires.  IMPRESSION: Increasing left pleural effusion and left lower lung consolidation/atelectasis.  Electronically Signed   By: Lucrezia Europe M.D.   On: 11/04/2022 14:06  Lab Results  Component Value Date   WBC 15.6 (H)  11/04/2022   HGB 9.2 (L) 11/04/2022   HCT 30.4 (L) 11/04/2022   MCV 97.7 11/04/2022   PLT 262 [id number]   Last metabolic panel Lab Results  Component Value Date   GLUCOSE 113 (H) 11/04/2022   NA 140 11/04/2022   K 5.4 (H) 11/04/2022   CL 103 11/04/2022   CO2 24 11/04/2022   BUN 59 (H) 11/04/2022   CREATININE 2.54 (H) 11/04/2022   GFRNONAA 17 (L) 11/04/2022   CALCIUM 8.5 (L) 11/04/2022   PROT 6.7 11/04/2022   ALBUMIN 2.5 (L) 11/04/2022   BILITOT 0.9 11/04/2022   ALKPHOS 87 11/04/2022   AST 31 11/04/2022   ALT 9 11/04/2022   ANIONGAP 13 11/04/2022    Assessment and Plan: * Cellulitis + RLE cellulitis on presentation  WBC 15.6  Started on IV cefepime and vancomycin for infectious coverage  Blood cultures drawn in ER  RLE u/s pending  Will have area formally marked and  dated Noted posterior calf deep tissue wound/opening  Will check MRI RLE to r/o deep tissue infection given WBC count  Follow    Hyperkalemia K5.4 today with no peaked T waves on EKG Recurring issue in the setting of stage III-IV CKD Will continue home [facility] Monitor  Acute kidney injury superimposed on chronic kidney disease ([place]) Presenting w/ Cr. 2.5 today w/ baseline Cr around 2  Mildly dry though w/ chronic lymphedema  Will hold on nephrotoxic agents for now  Gentle IVF hydration  Monitor renal function    Lymphedema Chronic lymphedema w/ superimposed acute cellulitis on distal RLE   S/p course of compression dressings outpt  Will cont w/ compression dressings on LLE  Treat cellulitis on RLE  Wound care consult  Follow    Sepsis ([place]) Meet sepsis criteria based on heart rate in 100s as well as white blood cell count 15.6 Noted likely soft tissue source with right lower extremity cellulitis Some?  Atelectasis versus pneumonia on chest x-ray though with no hypoxia present Afebrile Pancultured in the ER Lactate at 2.4 Started on cefepime, Flagyl and vancomycin in the ER Will place  on cefepime, azithromycin and vancomycin for soft tissue and respiratory coverage for now pending MRI of the right lower extremity to rule out deep tissue infection given white count and noted calf wound BP stable at present Trend lactate Gentle hydration-monitor volume status Follow  Essential hypertension BP stable  Titrate home regimen    Benign essential hypertension BP stable Titrate home regimen       Anemia Hgb 9 on presentation  Baseline hgb 9-10  Will monitor       Advance Care Planning:   Code Status: Full Code   Consults: None   Family Communication: Daughter [name] is at the bedside   Severity of Illness: The appropriate patient status for this patient is INPATIENT. Inpatient status is judged to be reasonable and necessary in order to provide the required intensity of service to ensure the patient's safety. The patient's presenting symptoms, physical exam findings, and initial radiographic and laboratory data in the context of their chronic comorbidities is felt to place them at high risk for further clinical deterioration. Furthermore, it is not anticipated that the patient will be medically stable for discharge from the hospital within 2 midnights of admission.   * I certify that at the point of admission it is my clinical judgment that the patient will require inpatient hospital care spanning beyond 2 midnights from the point of admission due to high intensity of service, high risk for further deterioration and high frequency of surveillance required.*  Author: Deneise Lever, MD 11/04/2022 3:53 PM  For on call review [email or web address].

## 2022-11-04 NOTE — Assessment & Plan Note (Addendum)
BP stable Titrate home regimen 

## 2022-11-05 DIAGNOSIS — L02415 Cutaneous abscess of right lower limb: Secondary | ICD-10-CM | POA: Diagnosis not present

## 2022-11-05 DIAGNOSIS — L02419 Cutaneous abscess of limb, unspecified: Secondary | ICD-10-CM

## 2022-11-05 DIAGNOSIS — N1831 Chronic kidney disease, stage 3a: Secondary | ICD-10-CM

## 2022-11-05 DIAGNOSIS — E875 Hyperkalemia: Secondary | ICD-10-CM | POA: Diagnosis not present

## 2022-11-05 DIAGNOSIS — I890 Lymphedema, not elsewhere classified: Secondary | ICD-10-CM | POA: Diagnosis not present

## 2022-11-05 DIAGNOSIS — D631 Anemia in chronic kidney disease: Secondary | ICD-10-CM

## 2022-11-05 DIAGNOSIS — L8990 Pressure ulcer of unspecified site, unspecified stage: Secondary | ICD-10-CM | POA: Insufficient documentation

## 2022-11-05 DIAGNOSIS — L03115 Cellulitis of right lower limb: Secondary | ICD-10-CM | POA: Diagnosis not present

## 2022-11-05 LAB — BLOOD CULTURE ID PANEL (REFLEXED) - BCID2

## 2022-11-05 LAB — COMPREHENSIVE METABOLIC PANEL
ALT: 7 U/L (ref 0–44)
AST: 16 U/L (ref 15–41)
Albumin: 2 g/dL — ABNORMAL LOW (ref 3.5–5.0)
Alkaline Phosphatase: 69 U/L (ref 38–126)
Anion gap: 6 (ref 5–15)
BUN: 57 mg/dL — ABNORMAL HIGH (ref 8–23)
CO2: 23 mmol/L (ref 22–32)
Calcium: 7.9 mg/dL — ABNORMAL LOW (ref 8.9–10.3)
Chloride: 112 mmol/L — ABNORMAL HIGH (ref 98–111)
Creatinine, Ser: 2.19 mg/dL — ABNORMAL HIGH (ref 0.44–1.00)
GFR, Estimated: 20 mL/min — ABNORMAL LOW (ref >60)
Glucose, Bld: 77 mg/dL (ref 70–99)
Potassium: 4.2 mmol/L (ref 3.5–5.1)
Sodium: 141 mmol/L (ref 135–145)
Total Bilirubin: 0.7 mg/dL (ref 0.3–1.2)
Total Protein: 5.3 g/dL — ABNORMAL LOW (ref 6.5–8.1)

## 2022-11-05 LAB — CBC
HCT: 25.7 % — ABNORMAL LOW (ref 36.0–46.0)
Hemoglobin: 8 g/dL — ABNORMAL LOW (ref 12.0–15.0)
MCH: 30 pg (ref 26.0–34.0)
MCHC: 31.1 g/dL (ref 30.0–36.0)
MCV: 96.3 fL (ref 80.0–100.0)
Platelets: 256 10*3/uL (ref 150–400)
RBC: 2.67 MIL/uL — ABNORMAL LOW (ref 3.87–5.11)
RDW: 13.9 % (ref 11.5–15.5)
WBC: 17.9 10*3/uL — ABNORMAL HIGH (ref 4.0–10.5)
nRBC: 0.6 % — ABNORMAL HIGH (ref 0.0–0.2)

## 2022-11-05 LAB — VANCOMYCIN, RANDOM: Vancomycin Rm: 12 ug/mL

## 2022-11-05 MED ORDER — VANCOMYCIN HCL 500 MG/100ML IV SOLN
500.0000 mg | INTRAVENOUS | Status: DC
  Filled 2022-11-05: qty 100

## 2022-11-05 MED ORDER — VANCOMYCIN HCL 1250 MG/250ML IV SOLN
1250.0000 mg | INTRAVENOUS | Status: DC
  Filled 2022-11-05: qty 250

## 2022-11-05 MED ORDER — OXYCODONE HCL 5 MG PO TABS
5.0000 mg | ORAL_TABLET | ORAL | Status: DC | PRN
  Administered 2022-11-05: 5 mg via ORAL
  Filled 2022-11-05: qty 1

## 2022-11-05 MED ORDER — LIDOCAINE-EPINEPHRINE 1 %-1:100000 IJ SOLN
20.0000 mL | Freq: Once | INTRAMUSCULAR | Status: AC
  Administered 2022-11-05: 20 mL via INTRADERMAL
  Filled 2022-11-05: qty 20

## 2022-11-05 NOTE — Consult Note (Signed)
PHARMACY - PHYSICIAN COMMUNICATION CRITICAL VALUE ALERT - BLOOD CULTURE IDENTIFICATION (BCID)  Hayley Maynard is an 89 y.o. female who presented to [facility] on 11/04/2022 with a chief complaint of cellulitis  Assessment:  1 of 4(anaerobic bottle) GNR BCID Bacteroides fragilis   Name of physician (or Provider) Contacted: Oran Rein  Current antibiotics: Vancomycin, Cefepime, Azithromycin  Changes to prescribed antibiotics recommended:  Recommendations accepted by provider - Will discontinue vancomycin and continue with cefepime and azithromycin(PNA) and de-escalate further once susceptibilities are available.  Results for orders placed or performed during the hospital encounter of 11/04/22  Blood Culture ID Panel (Reflexed) (Collected: 11/04/2022  1:27 PM)  Result Value Ref Range   Enterococcus faecalis NOT DETECTED NOT DETECTED   Enterococcus Faecium NOT DETECTED NOT DETECTED   Listeria monocytogenes NOT DETECTED NOT DETECTED   Staphylococcus species NOT DETECTED NOT DETECTED   Staphylococcus aureus (BCID) NOT DETECTED NOT DETECTED   Staphylococcus epidermidis NOT DETECTED NOT DETECTED   Staphylococcus lugdunensis NOT DETECTED NOT DETECTED   Streptococcus species NOT DETECTED NOT DETECTED   Streptococcus agalactiae NOT DETECTED NOT DETECTED   Streptococcus pneumoniae NOT DETECTED NOT DETECTED   Streptococcus pyogenes NOT DETECTED NOT DETECTED   A.calcoaceticus-baumannii NOT DETECTED NOT DETECTED   Bacteroides fragilis DETECTED (A) NOT DETECTED   Enterobacterales NOT DETECTED NOT DETECTED   Enterobacter cloacae complex NOT DETECTED NOT DETECTED   Escherichia coli NOT DETECTED NOT DETECTED   Klebsiella aerogenes NOT DETECTED NOT DETECTED   Klebsiella oxytoca NOT DETECTED NOT DETECTED   Klebsiella pneumoniae NOT DETECTED NOT DETECTED   Proteus species NOT DETECTED NOT DETECTED   Salmonella species NOT DETECTED NOT DETECTED   Serratia marcescens NOT DETECTED NOT DETECTED    Haemophilus influenzae NOT DETECTED NOT DETECTED   Neisseria meningitidis NOT DETECTED NOT DETECTED   Pseudomonas aeruginosa NOT DETECTED NOT DETECTED   Stenotrophomonas maltophilia NOT DETECTED NOT DETECTED   Candida albicans NOT DETECTED NOT DETECTED   Candida auris NOT DETECTED NOT DETECTED   Candida glabrata NOT DETECTED NOT DETECTED   Candida krusei NOT DETECTED NOT DETECTED   Candida parapsilosis NOT DETECTED NOT DETECTED   Candida tropicalis NOT DETECTED NOT DETECTED   Cryptococcus neoformans/gattii NOT DETECTED NOT DETECTED    Sharis Keeran A Sevan Mcbroom 11/05/2022  2:51 PM

## 2022-11-05 NOTE — Progress Notes (Signed)
Progress Note   Patient: Hayley Maynard [id number] DOB: 1922/08/15 DOA: 11/04/2022     1 DOS: the patient was seen and examined on 11/05/2022   Brief hospital course:  Hayley Maynard is a 89 y.o. female with medical history significant of Stage III chronic kidney disease, CAD, hypertension, dyslipidemia and osteoarthritis presenting w/ RLE cellulitis, leukocytosis.  History primarily from patient's daughter Hayley Maynard.  Per report, patient has been having worsening lymphedema predominate in the right leg.  Area has been getting wrapped by home health nursing for several weeks.  However swelling redness have only gotten worse.  No fevers or chills.  No nausea or vomiting.  Denies any actual trauma to the area.  Patient currently lives at home with her daughter with home health nursing coming in regularly.  No recent changes in medication.  Noticed worsening weeping.  Per the daughter, she took her mother to be evaluated at her PCPs office who recommended ER evaluation.  No significant medication changes noted. Presents to the ER afebrile, heart rate into the 120s, BP stable.  Satting well on room air.  Labs normal for white count of 15.6, hemoglobin 9.2 creatinine 2.54 with baseline creatinine around 2, potassium of 5.4, lactate of 2.4.  Right tib-fib grossly stable.  Chest x-ray with increasing left pleural effusion and left lower lung consolidation/atelectasis.  3/16 : Leukocytosis worsening 15.6-17.9, MRI showed Subcutaneous gas collection on the medial aspect of the proximal calf measuring 0.8 x 1.8 x 6.4 cm consistent with abscess.Marked subcutaneous soft tissue edema suggesting severe cellulitis.Mild intramuscular edema of the proximal medial and lateral heads of the gastrocnemius muscles suggesting myositis.Edema along the peroneal tendons suggesting tenosynovitis.  Surgery consultants recommendations pending  Assessment and Plan: * Cellulitis + RLE cellulitis on presentation  WBC 15.6 ->  17.9 Started on IV cefepime and vancomycin for infectious coverage  Bloodult cures drawn in ER  RLE u/s pending  Will have area formally marked and dated Noted posterior calf deep tissue wound/opening   Hyperkalemia K5.4 today with no peaked T waves on EKG Recurring issue in the setting of stage III-IV CKD Will continue home Lokelma Monitor  Acute kidney injury superimposed on chronic kidney disease ([place]) Presenting w/ Cr. 2.5 today w/ baseline Cr around 2  Mildly dry though w/ chronic lymphedema  Will hold on nephrotoxic agents for now  Gentle IVF hydration  Monitor renal function   Lymphedema Chronic lymphedema w/ superimposed acute cellulitis on distal RLE   S/p course of compression dressings outpt  Will cont w/ compression dressings on LLE  Treat cellulitis on RLE  Wound care consult  Follow   Sepsis ([place]) Meet sepsis criteria based on heart rate in 100s as well as white blood cell count 15.6 Noted likely soft tissue source with right lower extremity cellulitis Some?  Atelectasis versus pneumonia on chest x-ray though with no hypoxia present Afebrile Pancultured in the ER Lactate at 2.4 Started on cefepime, Flagyl and vancomycin in the ER Will place on cefepime, azithromycin and vancomycin for soft tissue and respiratory coverage for now pending MRI of the right lower extremity to rule out deep tissue infection given white count and noted calf wound BP stable at present Trend lactate Gentle hydration-monitor volume status Follow  Essential hypertension BP stable  Titrate home regimen   Benign essential hypertension BP stable Titrate home regimen   Anemia Hgb 9 on presentation  Baseline hgb 9-10  Will monitor      Subjective: Patient seen and  examined this morning, no overnight events.  Patient wants to talk to her daughter.  Vital labs and imaging reviewed.  Patient vitally stable labs with leukocytosis 17.9.   Physical Exam: Vitals:   11/04/22 1900  11/04/22 2003 11/05/22 0500 11/05/22 0832  BP: 119/70 119/70 123/79 (!) 97/52  Pulse: 94 95 88 83  Resp: (!) 24 17 18 20   Temp: 98.2 F (36.8 C) 98.2 F (36.8 C) 98.2 F (36.8 C) 98 F (36.7 C)  TempSrc: Axillary Oral Oral   SpO2: 100% 100% 96% 91%   Physical Exam Constitutional:      Appearance: She is ill-appearing. She is not diaphoretic.  HENT:     Head: Normocephalic and atraumatic.     Mouth/Throat:     Mouth: Mucous membranes are dry.  Eyes:     Extraocular Movements: Extraocular movements intact.     Pupils: Pupils are equal, round, and reactive to light.  Cardiovascular:     Rate and Rhythm: Normal rate and regular rhythm.     Heart sounds: No murmur heard. Pulmonary:     Effort: Pulmonary effort is normal.  Abdominal:     General: Abdomen is flat.     Palpations: Abdomen is soft.  Musculoskeletal:     Cervical back: Normal range of motion.     Comments: Right lower extremity cellulitis, left lower extremity with dressing in place, pillow underneath the leg stained with blood oozing out of the lower extremity.  Skin:    General: Skin is warm.  Neurological:     General: No focal deficit present.     Mental Status: She is alert.  Psychiatric:        Attention and Perception: She is attentive.        Speech: Speech normal.        Behavior: Behavior is slowed.     Data Reviewed:  Results are pending, will review when available.  Family Communication: Admitting physician talked to daughter and updated about the status.  Disposition: Status is: Inpatient Remains inpatient appropriate because: Cellulits and abscess  Planned Discharge Destination: Home    Time spent: 35 minutes  Author: Oran Rein, MD 11/05/2022 11:05 AM  For on call review [email or web address].

## 2022-11-05 NOTE — Consult Note (Signed)
Patient ID: Hayley Maynard, female   DOB: 1921-09-01, 89 y.o.   MRN: [id number]  HPI Hayley Maynard is a 89 y.o. female  Hayley Maynard is a 89 y.o. female with medical history significant of Stage III chronic kidney disease, CAD, hypertension, dyslipidemia and osteoarthritis presenting w/ RLE cellulitis, leukocytosis.  History primarily from patient's daughter [name].  Per report, patient has been having worsening lymphedema predominate in the right leg.  Area has been getting wrapped by home health nursing for several weeks.  However swelling redness have only gotten worse.  No fevers or chills.  No nausea or vomiting.  Denies any actual trauma to the area.  Patient currently lives at home with her daughter with home health nursing coming in regularly.  No recent changes in medication.  Noticed worsening weeping.    Did have a MRI scan that have personally reviewed showing evidence of posterior leg abscess on the right side.    Labs normal for white count of 15.6, hemoglobin 9.2 creatinine 2.54 with baseline creatinine around 2, potassium of 5.4, lactate of 2.4.  Right tib-fib grossly stable.  Chest x-ray with increasing left pleural effusion and left lower lung consolidation/atelectasis.  HPI  Past Medical History:  Diagnosis Date   Arthritis    feet   CKD (chronic kidney disease), stage III ([place])    Coronary artery disease    Glaucoma    Hyperlipidemia    Hypertension    LVH (left ventricular hypertrophy)    Wears dentures    full upper and lower    Past Surgical History:  Procedure Laterality Date   ABDOMINAL HYSTERECTOMY     CATARACT EXTRACTION W/ INTRAOCULAR LENS  IMPLANT, BILATERAL     CORONARY ARTERY BYPASS GRAFT     over 10 yrs ago (per pt)   INTRAMEDULLARY (IM) NAIL INTERTROCHANTERIC Right 03/20/2020   Procedure: INTRAMEDULLARY (IM) NAIL INTERTROCHANTRIC;  Surgeon: Hessie Knows, MD;  Location: [facility];  Service: Orthopedics;  Laterality: Right;   PHOTOCOAGULATION WITH LASER Right  09/26/2018   Procedure: PHOTOCOAGULATION WITH LASER;  Surgeon: Leandrew Koyanagi, MD;  Location: [facility];  Service: Ophthalmology;  Laterality: Right;  laser settings: 2024mW, 31.3% duty cycle, 120 seconds    History reviewed. No pertinent family history.  Social History Social History   Tobacco Use   Smoking status: Former    Packs/day: 0.50    Years: 20.00    Additional pack years: 0.00    Total pack years: 10.00    Types: Cigarettes    Quit date: 02/20/2020    Years since quitting: 2.7   Smokeless tobacco: Never  Vaping Use   Vaping Use: Never used  Substance Use Topics   Alcohol use: Not Currently   Drug use: Not Currently    Allergies  Allergen Reactions   Propoxyphene Other (See Comments)   Telmisartan-Hctz Other (See Comments)    Current Facility-Administered Medications  Medication Dose Route Frequency Provider Last Rate Last Admin   0.9 %  sodium chloride infusion   Intravenous Continuous Deneise Lever, MD 75 mL/hr at 11/05/22 0005 Restarted at 11/05/22 0005   azithromycin (ZITHROMAX) 500 mg in sodium chloride 0.9 % 250 mL IVPB  500 mg Intravenous Q24H Deneise Lever, MD   Stopped at 11/04/22 1839   ceFEPIme (MAXIPIME) 2 g in sodium chloride 0.9 % 100 mL IVPB  2 g Intravenous Q24H Coulter, Carolyn, RPH       enoxaparin (LOVENOX) injection 40 mg  40  mg Subcutaneous Q24H Deneise Lever, MD   40 mg at 11/04/22 1740   feeding supplement (ENSURE ENLIVE / ENSURE PLUS) liquid 237 mL  237 mL Oral BID BM Deneise Lever, MD   237 mL at 11/05/22 1404   LORazepam (ATIVAN) tablet 0.5 mg  0.5 mg Oral Once PRN Deneise Lever, MD       metoprolol succinate (TOPROL-XL) 24 hr tablet 25 mg  25 mg Oral Daily Deneise Lever, MD   25 mg at 11/04/22 1642   morphine (PF) 2 MG/ML injection 2 mg  2 mg Intravenous Q3H PRN Deneise Lever, MD   2 mg at 11/05/22 0908   ondansetron (ZOFRAN) tablet 4 mg  4 mg Oral Q6H PRN Deneise Lever, MD       Or   ondansetron  [facility]) injection 4 mg  4 mg Intravenous Q6H PRN Deneise Lever, MD       oxyCODONE (Oxy IR/ROXICODONE) immediate release tablet 5 mg  5 mg Oral Q4H PRN Danyon Mcginness F, MD   5 mg at 11/05/22 1234   sodium zirconium cyclosilicate (LOKELMA) packet 10 g  10 g Oral Daily Deneise Lever, MD   10 g at 11/04/22 1655     Review of Systems Full ROS  was asked and was negative except for the information on the HPI  Physical Exam Blood pressure (!) 97/52, pulse 83, temperature 98 F (36.7 C), resp. rate 20, height 5\' 1"  (1.549 m), weight 60.9 kg, SpO2 91 %.  CONSTITUTIONAL: NAD. EYES: Pupils are equal, round, and reactive Sclera are non-icteric. EARS, NOSE, MOUTH AND THROAT: The oropharynx is clear. The oral mucosa is pink and moist. Hearing is intact to voice. LYMPH NODES:  Lymph nodes in the neck are normal. RESPIRATORY:  Lungs are clear. There is normal respiratory effort, with equal breath sounds bilaterally, and without pathologic use of accessory muscles. CARDIOVASCULAR: Heart is regular without murmurs, gallops, or rubs. GI: The abdomen is  soft, nontender, and nondistended. There are no palpable masses. There is no hepatosplenomegaly. There are normal bowel sounds in all quadrants. [place]: There is evidence of fluctuance and induration over the posterior aspect of the right leg, there is drainage of purulent fluid. .  It is exquisitely tender to palpation.  There is no evidence of necrotizing infection.  Left leg is wrappe SKIN: Has multiple ulceration and weeping on both upper and lower extremities NEUROLOGIC: Motor and sensation is grossly normal. Cranial nerves are grossly intact. PSYCH:  Oriented to person, place and time. Affect is normal.   Data Reviewed   I have personally reviewed the patient's imaging, laboratory findings and medical records.     Assessment/Plan 89 year old debilitated female with abscess a of right leg..  Discussed with patient  and family in detail that  currently she is get an abscess I  Do recommend proceeding with incision and drainage.   Discussed with them also that she is frail debilitated and she may not survive this injury ( hospitalization due to the fact that she is dying) . She  Does have multiple weeping sites from both upper extremities and lower extremities I do think that she is towards the end of her life  Please note that I spent 75 minutes in this encounter including personally reviewing imaging studies, coordinating his care, placing orders and performing appropriate mentation Appreciate Dr. Jodi Mourning assistance with conscious sedation   PROCEDURE NOTE  Incision and drainage of posterior leg deep  right  abscess Debridement sub q and muscle 6cm2   Anesthesia: lidocaine 1% w  qpi 20cc   Findings: Right Posterior leg abscess Brittle and debilitated tissue that basically melts away with debridement.   EBL: minimal   Complications: none   After informed consent was obtained the patient was placed in a lateral decubitus position.  the area was prepped and draped in the usual sterile fashion and lidocaine 1% with epi was injected over the area of interest.  Using 11 blade knife we created an elliptical incision pus was drained.  This was appropriately cultured.  We were able to break down all loculations.  This was located in the posterior aspect of the rigth leg. debridement was performed in an excisional fashion with Metzenbaums.  No evidence of necrotizing infection was seen. We did see significant chronic ischemia and just melting of the tissues due to severe chronic disease.  There was no evidence of necrotizing infection but all the tissues were just falling apart Hemostasis was obtained with pressure.  Iodoform packing was placed and a wrap gauze was placed in the standard fashion.  No complications Discussed  with the hospitalist about poor prognosis and my concern for normal healing of the wounds     Caroleen Hamman, MD  [facility] Surgeon 11/05/2022, 2:36 PM

## 2022-11-06 ENCOUNTER — Encounter: Payer: Self-pay | Admitting: Cardiology

## 2022-11-06 DIAGNOSIS — L03115 Cellulitis of right lower limb: Secondary | ICD-10-CM | POA: Diagnosis not present

## 2022-11-06 DIAGNOSIS — I10 Essential (primary) hypertension: Secondary | ICD-10-CM | POA: Diagnosis not present

## 2022-11-06 DIAGNOSIS — I890 Lymphedema, not elsewhere classified: Secondary | ICD-10-CM | POA: Diagnosis not present

## 2022-11-06 LAB — BASIC METABOLIC PANEL
Anion gap: 13 (ref 5–15)
BUN: 62 mg/dL — ABNORMAL HIGH (ref 8–23)
CO2: 21 mmol/L — ABNORMAL LOW (ref 22–32)
Calcium: 7.9 mg/dL — ABNORMAL LOW (ref 8.9–10.3)
Chloride: 108 mmol/L (ref 98–111)
Creatinine, Ser: 2.92 mg/dL — ABNORMAL HIGH (ref 0.44–1.00)
GFR, Estimated: 14 mL/min — ABNORMAL LOW (ref >60)
Glucose, Bld: 79 mg/dL (ref 70–99)
Potassium: 5.4 mmol/L — ABNORMAL HIGH (ref 3.5–5.1)
Sodium: 142 mmol/L (ref 135–145)

## 2022-11-06 LAB — CBC
HCT: 24.5 % — ABNORMAL LOW (ref 36.0–46.0)
Hemoglobin: 7.5 g/dL — ABNORMAL LOW (ref 12.0–15.0)
MCH: 29.9 pg (ref 26.0–34.0)
MCHC: 30.6 g/dL (ref 30.0–36.0)
MCV: 97.6 fL (ref 80.0–100.0)
Platelets: 280 10*3/uL (ref 150–400)
RBC: 2.51 MIL/uL — ABNORMAL LOW (ref 3.87–5.11)
RDW: 13.9 % (ref 11.5–15.5)
WBC: 24.1 10*3/uL — ABNORMAL HIGH (ref 4.0–10.5)
nRBC: 3.4 % — ABNORMAL HIGH (ref 0.0–0.2)

## 2022-11-06 MED ORDER — MORPHINE SULFATE (PF) 2 MG/ML IV SOLN
2.0000 mg | INTRAVENOUS | Status: DC | PRN
  Administered 2022-11-06: 2 mg via INTRAVENOUS
  Filled 2022-11-06: qty 1

## 2022-11-06 MED ORDER — HALOPERIDOL LACTATE 5 MG/ML IJ SOLN
1.0000 mg | Freq: Three times a day (TID) | INTRAMUSCULAR | Status: DC | PRN
  Administered 2022-11-06: 1 mg via INTRAVENOUS
  Filled 2022-11-06: qty 1

## 2022-11-06 MED ORDER — HEPARIN SODIUM (PORCINE) 5000 UNIT/ML IJ SOLN
5000.0000 [IU] | Freq: Three times a day (TID) | INTRAMUSCULAR | Status: DC
  Administered 2022-11-06 – 2022-11-07 (×3): 5000 [IU] via SUBCUTANEOUS
  Filled 2022-11-06 (×4): qty 1

## 2022-11-06 MED ORDER — SODIUM CHLORIDE 0.9 % IV SOLN
1.0000 g | INTRAVENOUS | Status: DC
  Administered 2022-11-06 – 2022-11-07 (×2): 1 g via INTRAVENOUS
  Filled 2022-11-06: qty 1
  Filled 2022-11-06: qty 10

## 2022-11-06 NOTE — Progress Notes (Signed)
. Progress Note   Patient: Hayley Maynard [id number] DOB: [birth year] DOA: 11/04/2022     2 DOS: the patient was seen and examined on 11/06/2022   Brief hospital course:  TAMMY SAYNE is a 89 y.o. female with medical history significant of Stage III chronic kidney disease, CAD, hypertension, dyslipidemia and osteoarthritis presenting w/ RLE cellulitis, leukocytosis.  History primarily from patient's daughter [name].  Per report, patient has been having worsening lymphedema predominate in the right leg.  Area has been getting wrapped by home health nursing for several weeks.  However swelling redness have only gotten worse.  No fevers or chills.  No nausea or vomiting.  Denies any actual trauma to the area.  Patient currently lives at home with her daughter with home health nursing coming in regularly.  No recent changes in medication.  Noticed worsening weeping.  Per the daughter, she took her mother to be evaluated at her PCPs office who recommended ER evaluation.  No significant medication changes noted. Presents to the ER afebrile, heart rate into the 120s, BP stable.  Satting well on room air.  Labs normal for white count of 15.6, hemoglobin 9.2 creatinine 2.54 with baseline creatinine around 2, potassium of 5.4, lactate of 2.4.  Right tib-fib grossly stable.  Chest x-ray with increasing left pleural effusion and left lower lung consolidation/atelectasis.  3/16 : Leukocytosis worsening 15.6-17.9, MRI showed Subcutaneous gas collection on the medial aspect of the proximal calf measuring 0.8 x 1.8 x 6.4 cm consistent with abscess.Marked subcutaneous soft tissue edema suggesting severe cellulitis.Mild intramuscular edema of the proximal medial and lateral heads of the gastrocnemius muscles suggesting myositis.Edema along the peroneal tendons suggesting tenosynovitis.  Surgery consultants recommendations pending  3/17 : Post I&D patient has been more somnolent with confusion.  Wakes up and agitation.   Discussed about palliative measures as patient's has extremely poor prognosis secondary to ongoing tissue pathology.  Daughter agreeable but patient prior to presentation had capacity was AOx3 able to make decision.  Has been altered and confused since I&D.  If patient continues to stay in this state with poor prognosis has advanced directive of not continuing aggressive medical management if outcome is futile.  Will wait another 24 hours prior to starting comfort care and discussion with daughter.  Daughter agreeable to comfort measures.  Palliative eval in the morning.  Assessment and Plan: * Cellulitis + RLE cellulitis on presentation  WBC 15.6 -> 17.9 Started on IV cefepime and vancomycin for infectious coverage  Bloodult cures drawn in ER  RLE u/s pending  Will have area formally marked and dated Noted posterior calf deep tissue wound/opening-patient underwent I&D by surgery and since then patient has been confused.  Not arousable.  Intermittently agitated.  Hyperkalemia K5.4 today with no peaked T waves on EKG Recurring issue in the setting of stage III-IV CKD Will continue home Lokelma Monitor  Acute kidney injury superimposed on chronic kidney disease ([place]) Presenting w/ Cr. 2.5 today w/ baseline Cr around 2  Mildly dry though w/ chronic lymphedema  Will hold on nephrotoxic agents for now  Gentle IVF hydration  Monitor renal function   Lymphedema Chronic lymphedema w/ superimposed acute cellulitis on distal RLE   S/p course of compression dressings outpt  Will cont w/ compression dressings on LLE  Treat cellulitis on RLE  Wound care consult  Follow   Sepsis ([place]) Meet sepsis criteria based on heart rate in 100s as well as white blood cell count 15.6 Noted likely  soft tissue source with right lower extremity cellulitis Some?  Atelectasis versus pneumonia on chest x-ray though with no hypoxia present Afebrile Pancultured in the ER Lactate at 2.4 Started on cefepime,  Flagyl and vancomycin in the ER Will place on cefepime, azithromycin and vancomycin for soft tissue and respiratory coverage for now pending MRI of the right lower extremity to rule out deep tissue infection given white count and noted calf wound BP stable at present Trend lactate Gentle hydration-monitor volume status Follow  Essential hypertension BP stable  Titrate home regimen   Benign essential hypertension BP stable Titrate home regimen   Anemia Hgb 9 on presentation  Baseline hgb 9-10  Will monitor      Subjective: Patient seen and examined this morning, no overnight events.  Somnolent unable to answer any questions.  Daughter by bedside updated about the status and poor prognosis.  Physical Exam: Vitals:   11/05/22 1100 11/05/22 1556 11/06/22 0500 11/06/22 0755  BP:  91/64  125/82  Pulse:  100  92  Resp:  20 19 16   Temp:  98 F (36.7 C) 98.6 F (37 C) 97.6 F (36.4 C)  TempSrc:      SpO2:  98% 98% 97%  Weight: 60.9 kg     Height: 5\' 1"  (1.549 m)      Physical Exam Constitutional:      Appearance: She is ill-appearing. She is not diaphoretic.  HENT:     Head: Normocephalic and atraumatic.     Mouth/Throat:     Mouth: Mucous membranes are dry.  Eyes:     Extraocular Movements: Extraocular movements intact.     Pupils: Pupils are equal, round, and reactive to light.  Cardiovascular:     Rate and Rhythm: Normal rate and regular rhythm.     Heart sounds: No murmur heard. Pulmonary:     Effort: Pulmonary effort is normal.  Abdominal:     General: Abdomen is flat.     Palpations: Abdomen is soft.  Musculoskeletal:     Cervical back: Normal range of motion.     Comments: Right lower extremity cellulitis, left lower extremity with dressing in place, pillow underneath the leg stained with blood oozing out of the lower extremity.  Skin:    General: Skin is warm.  Neurological:     General: No focal deficit present.     Mental Status: She is alert.   Psychiatric:        Attention and Perception: She is attentive.        Speech: Speech normal.        Behavior: Behavior is slowed.     Data Reviewed:  Results are pending, will review when available.  Family Communication: Admitting physician talked to daughter and updated about the status.  Disposition: Status is: Inpatient Remains inpatient appropriate because: Cellulits and abscess  Planned Discharge Destination: Home    Time spent: 35 minutes  Author: Oran Rein, MD 11/06/2022 2:46 PM  For on call review [email or web address].

## 2022-11-06 NOTE — Progress Notes (Signed)
Pt seen and examined. Family moving towards palliation. She is clearly suffering and dying. Definitely the right thing to do. We will be available

## 2022-11-06 NOTE — Progress Notes (Signed)
IV noted leaking. Unable to establish IV access. Vascular Access Team consulted. Wound care completed.

## 2022-11-07 ENCOUNTER — Ambulatory Visit: Payer: Medicare Other | Admitting: Cardiology

## 2022-11-07 DIAGNOSIS — L03115 Cellulitis of right lower limb: Secondary | ICD-10-CM | POA: Diagnosis not present

## 2022-11-07 LAB — CULTURE, BLOOD (ROUTINE X 2): Special Requests: ADEQUATE

## 2022-11-07 MED ORDER — GLYCOPYRROLATE 0.2 MG/ML IJ SOLN: 0.2000 mg | INTRAMUSCULAR | Status: DC | PRN

## 2022-11-07 MED ORDER — HALOPERIDOL LACTATE 5 MG/ML IJ SOLN: 2.5000 mg | INTRAMUSCULAR | Status: DC | PRN

## 2022-11-07 MED ORDER — POLYVINYL ALCOHOL 1.4 % OP SOLN: 1.0000 [drp] | Freq: Four times a day (QID) | OPHTHALMIC | Status: DC | PRN

## 2022-11-07 MED ORDER — ONDANSETRON 4 MG PO TBDP: 4.0000 mg | ORAL_TABLET | Freq: Four times a day (QID) | ORAL | Status: DC | PRN

## 2022-11-07 MED ORDER — METRONIDAZOLE 500 MG/100ML IV SOLN
500.0000 mg | Freq: Two times a day (BID) | INTRAVENOUS | Status: DC
  Administered 2022-11-07: 500 mg via INTRAVENOUS
  Filled 2022-11-07: qty 100

## 2022-11-07 MED ORDER — MIDAZOLAM HCL 2 MG/2ML IJ SOLN: 2.0000 mg | INTRAMUSCULAR | Status: DC | PRN

## 2022-11-07 MED ORDER — ACETAMINOPHEN 325 MG PO TABS: 650.0000 mg | ORAL_TABLET | Freq: Four times a day (QID) | ORAL | Status: DC | PRN

## 2022-11-07 MED ORDER — DIPHENHYDRAMINE HCL 50 MG/ML IJ SOLN: 25.0000 mg | INTRAMUSCULAR | Status: DC | PRN

## 2022-11-07 MED ORDER — GLYCOPYRROLATE 1 MG PO TABS: 1.0000 mg | ORAL_TABLET | ORAL | Status: DC | PRN

## 2022-11-07 MED ORDER — ACETAMINOPHEN 650 MG RE SUPP: 650.0000 mg | Freq: Four times a day (QID) | RECTAL | Status: DC | PRN

## 2022-11-07 MED ORDER — ONDANSETRON HCL 4 MG/2ML IJ SOLN: 4.0000 mg | Freq: Four times a day (QID) | INTRAMUSCULAR | Status: DC | PRN

## 2022-11-07 MED ORDER — SODIUM CHLORIDE 0.9 % IV SOLN: INTRAVENOUS | Status: DC

## 2022-11-07 MED ORDER — MORPHINE SULFATE (PF) 2 MG/ML IV SOLN: 2.0000 mg | INTRAVENOUS | Status: DC | PRN

## 2022-11-07 NOTE — Plan of Care (Signed)
Patient discharged via EMS to [place] home. IV was not removed per request of hospice home. Patient tolerated transfer well.

## 2022-11-07 NOTE — Discharge Summary (Signed)
Hayley Maynard [id number] DOB: May 02, 1922 DOA: 11/04/2022  PCP: Maryland Pink, MD  Admit date: 11/04/2022 Discharge date: 11/07/2022  Time spent: 35 minutes    Discharge Diagnoses:  Principal Problem:   Cellulitis Active Problems:   Hyperkalemia   Lymphedema   Anemia   Benign essential hypertension   Essential hypertension   Sepsis ([place])   Abscess of lower leg   Pressure injury of skin   Discharge Condition: stable  Diet recommendation: ad lib  Filed Weights   11/05/22 1100  Weight: 60.9 kg    History of present illness:  From admission h and p Hayley Maynard is a 89 y.o. female with medical history significant of Stage III chronic kidney disease, CAD, hypertension, dyslipidemia and osteoarthritis presenting w/ RLE cellulitis, leukocytosis.  History primarily from patient's daughter [name].  Per report, patient has been having worsening lymphedema predominate in the right leg.  Area has been getting wrapped by home health nursing for several weeks.  However swelling redness have only gotten worse.  No fevers or chills.  No nausea or vomiting.  Denies any actual trauma to the area.  Patient currently lives at home with her daughter with home health nursing coming in regularly.  No recent changes in medication.  Noticed worsening weeping.  Per the daughter, she took her mother to be evaluated at her PCPs office who recommended ER evaluation.  No significant medication changes noted. Presents to the ER afebrile, heart rate into the 120s, BP stable.  Satting well on room air.  Labs normal for white count of 15.6, hemoglobin 9.2 creatinine 2.54 with baseline creatinine around 2, potassium of 5.4, lactate of 2.4.  Right tib-fib grossly stable.  Chest x-ray with increasing left pleural effusion and left lower lung consolidation/atelectasis.    Hospital Course:  3/16 : Leukocytosis worsening 15.6-17.9, MRI showed Subcutaneous gas collection on the medial aspect of the proximal calf  measuring 0.8 x 1.8 x 6.4 cm consistent with abscess.Marked subcutaneous soft tissue edema suggesting severe cellulitis.Mild intramuscular edema of the proximal medial and lateral heads of the gastrocnemius muscles suggesting myositis.Edema along the peroneal tendons suggesting tenosynovitis.  Surgery consultants recommendations pending   3/17 : Post I&D patient has been more somnolent with confusion.  Wakes up and agitation.  Discussed about palliative measures as patient's has extremely poor prognosis secondary to ongoing tissue pathology.  Daughter agreeable but patient prior to presentation had capacity was AOx3 able to make decision.  Has been altered and confused since I&D.  If patient continues to stay in this state with poor prognosis has advanced directive of not continuing aggressive medical management if outcome is futile.  Will wait another 24 hours prior to starting comfort care and discussion with daughter.  Daughter agreeable to comfort measures.  Palliative eval in the morning.   3/18: not eating or drinking, remains encephalopathic, worsening kidney function and leukocytosis noted, bacteroides growing in one blood culture. Discussed course and prognosis at length with daughter, shared that at this point unlikely to make a meaningful recover, daughter agrees, transitioning to full comfort care, consult to hospice  * Cellulitis Bacteremia + RLE cellulitis on presentation  WBC 15.6 -> 25 Treated with IV cefepime and vancomycin for infectious coverage; flagyl added for one blood culture growing bacteroides S/p I and D right lower extremity with general surgery\  Endephalopathy 2/2 underlying infection, renal dysfunction, narcotics   Hyperkalemia 2/2 kidney dysfunction, treated with lokelma   Acute kidney injury superimposed on chronic kidney disease  stage 4 (HCC) Worsening kidney function here, most recent cr ~3, with hyperkalemia as above. Treated with IV fluids   Anemia Hgb 9  on presentation trending down to 7s, no bleeding noted   Lymphedema Chronic lymphedema w/ superimposed acute cellulitis on distal RLE   S/p course of compression dressings outpt    Sepsis ([place]) Meet sepsis criteria based on heart rate in 100s as well as white blood cell count 15.6 Noted likely soft tissue source with right lower extremity cellulitis Some?  Atelectasis versus pneumonia on chest x-ray though with no hypoxia present Afebrile Pancultured in the ER Started on cefepime, Flagyl and vancomycin in the ER Inpatient treated with cefepime, azithromycin and vancomycin for soft tissue and respiratory coverage    Essential hypertension  Procedures: I and D  Consultations: Gen surg  Discharge Exam: Vitals:   11/07/22 0408 11/07/22 0756  BP: 134/80 126/78  Pulse: 99 87  Resp: (!) 22 18  Temp: (!) 97.4 F (36.3 C) 97.7 F (36.5 C)  SpO2: 91% 90%    General: chronically ill appearing, not responding to questions Cardiovascular: RRR Respiratory: rales at bases Skin: lower extremities bandaged  Discharge Instructions   Discharge Instructions     Change dressing (specify)   Complete by: As directed    Daily changes   Diet general   Complete by: As directed    Increase activity slowly   Complete by: As directed       Allergies as of 11/07/2022       Reactions   Propoxyphene Other (See Comments)   Telmisartan-hctz Other (See Comments)        Medication List     STOP taking these medications    acetaminophen 325 MG tablet Commonly known as: TYLENOL   amLODipine 10 MG tablet Commonly known as: NORVASC   aspirin EC 81 MG tablet   brimonidine 0.2 % ophthalmic solution Commonly known as: ALPHAGAN   cyanocobalamin 1000 MCG tablet Commonly known as: VITAMIN B12   dorzolamide-timolol 2-0.5 % ophthalmic solution Commonly known as: COSOPT   feeding supplement Liqd   ferrous [id number] C-folic acid capsule Commonly known as: TRINSICON /  FOLTRIN   hydrALAZINE 50 MG tablet Commonly known as: APRESOLINE   lactulose 10 GM/15ML solution Commonly known as: CHRONULAC   latanoprost 0.005 % ophthalmic solution Commonly known as: XALATAN   losartan 50 MG tablet Commonly known as: COZAAR   megestrol 40 MG/ML suspension Commonly known as: MEGACE   metolazone 2.5 MG tablet Commonly known as: ZAROXOLYN   metoprolol succinate 25 MG 24 hr tablet Commonly known as: Toprol XL   simvastatin 20 MG tablet Commonly known as: ZOCOR   sodium zirconium cyclosilicate 10 g Pack packet Commonly known as: LOKELMA   torsemide 20 MG tablet Commonly known as: [facility]               Discharge Care Instructions  (From admission, onward)           Start     Ordered   11/07/22 0000  Change dressing (specify)       Comments: Daily changes   11/07/22 1618           Allergies  Allergen Reactions   Propoxyphene Other (See Comments)   Telmisartan-Hctz Other (See Comments)      The results of significant diagnostics from this hospitalization (including imaging, microbiology, ancillary and laboratory) are listed below for reference.    Significant Diagnostic Studies: MR TIBIA FIBULA RIGHT WO CONTRAST  Result Date: 11/04/2022 CLINICAL DATA:  Soft tissue infection suspected. EXAM: MRI OF LOWER RIGHT EXTREMITY WITHOUT CONTRAST TECHNIQUE: Multiplanar, multisequence MR imaging of the right lower leg was performed. No intravenous contrast was administered. COMPARISON:  Radiographs performed earlier on the same date FINDINGS: Bones/Joint/Cartilage Bone marrow signal is within normal limits. No evidence of osteomyelitis. Ligaments Interosseous ligament is intact. Muscles and Tendons Mild intramuscular edema of the proximal medial and lateral heads of the gastrocnemius muscles. No fluid collection or abscess. Generalized muscle atrophy of the flexor, extensor and peroneal compartment muscles. Edema along the peroneal tendons  suggesting tenosynovitis. Achilles tendon appears intact. Soft tissues There is marked subcutaneous soft tissue edema suggesting severe cellulitis. There is subcutaneous gas collection on the medial aspect of the proximal calf with an open small penetrating skin ulcer. This gas collection measures a proximally 0.8 x 1.8 x 6.4 cm with surrounding edema consistent with an abscess. IMPRESSION: 1. Subcutaneous gas collection on the medial aspect of the proximal calf measuring 0.8 x 1.8 x 6.4 cm consistent with abscess. 2. Marked subcutaneous soft tissue edema suggesting severe cellulitis. 3. Mild intramuscular edema of the proximal medial and lateral heads of the gastrocnemius muscles suggesting myositis. 4. No evidence of osteomyelitis. 5. Edema along the peroneal tendons suggesting tenosynovitis. 6. Generalized muscle atrophy of the flexor, extensor and peroneal compartment muscles. Electronically Signed   By: Keane Police D.O.   On: 11/04/2022 22:56   US Venous Img Lower Unilateral Right  Result Date: 11/04/2022 CLINICAL DATA:  Right lower extremity pain and swelling EXAM: Right LOWER EXTREMITY VENOUS DOPPLER ULTRASOUND TECHNIQUE: Gray-scale sonography with graded compression, as well as color Doppler and duplex ultrasound were performed to evaluate the lower extremity deep venous systems from the level of the common femoral vein and including the common femoral, femoral, profunda femoral, popliteal and calf veins including the posterior tibial, peroneal and gastrocnemius veins when visible. The superficial great saphenous vein was also interrogated. Spectral Doppler was utilized to evaluate flow at rest and with distal augmentation maneuvers in the common femoral, femoral and popliteal veins. COMPARISON:  11/04/2022 radiograph FINDINGS: Contralateral Common Femoral Vein: Respiratory phasicity is normal and symmetric with the symptomatic side. No evidence of thrombus. Normal compressibility. Common Femoral Vein:  No evidence of thrombus. Normal compressibility, respiratory phasicity and response to augmentation. Saphenofemoral Junction: Positive for nonocclusive thrombus. Partial compressibility. Profunda Femoral Vein: No evidence of thrombus. Normal compressibility and flow on color Doppler imaging. Femoral Vein: No evidence of thrombus. Normal compressibility, respiratory phasicity and response to augmentation. Popliteal Vein: No evidence of thrombus. Normal compressibility, respiratory phasicity and response to augmentation. Calf Veins: No evidence of thrombus. Normal compressibility and flow on color Doppler imaging. Superficial Great Saphenous Vein: Positive for nonocclusive thrombus within the proximal saphenous vein near its confluence with the common femoral vein. IMPRESSION: 1. Technically negative for acute DVT, however there is nonocclusive acute thrombus visualized within the proximal great saphenous vein and saphenofemoral junction. Electronically Signed   By: Donavan Foil M.D.   On: 11/04/2022 15:32   DG Tibia/Fibula Right  Result Date: 11/04/2022 CLINICAL DATA:  distal tib/fib pain, swelling EXAM: RIGHT TIBIA AND FIBULA - 2 VIEW COMPARISON:  None Available. FINDINGS: Distal aspect of femoral IM rod is partially visualized. Diffuse osteopenia. Negative for fracture or dislocation. Patchy arterial calcifications. IMPRESSION: Negative for fracture or other acute finding. Electronically Signed   By: Lucrezia Europe M.D.   On: 11/04/2022 14:15   DG Chest [facility]  Result Date: 11/04/2022 CLINICAL DATA:  Patient sent from PCP for a wound on the back of right leg. Per daughter, both legs had been getting wrapped by home health RN but today notice a "hole". Sent for further evaluation. EXAM: PORTABLE CHEST - 1 VIEW COMPARISON:  08/31/2022 FINDINGS: Interval increase in left pleural effusion and left lower lung consolidation/atelectasis. Right lung clear. Heart size and mediastinal contours are within normal  limits. Aortic Atherosclerosis (ICD10-170.0). CABG markers. Sternotomy wires. IMPRESSION: Increasing left pleural effusion and left lower lung consolidation/atelectasis. Electronically Signed   By: Lucrezia Europe M.D.   On: 11/04/2022 14:06   ECHOCARDIOGRAM COMPLETE  Result Date: 11/01/2022    ECHOCARDIOGRAM REPORT   Patient Name:   Kinesha B Gladu Date of Exam: 10/28/2022 Medical Rec #:  [id number]   Height:       61.0 in Accession #:    [id number]  Weight:       130.0 lb Date of Birth:  12/04/1921   BSA:          1.573 m Patient Age:    76 years    BP:           128/80 mmHg Patient Gender: F           HR:           105 bpm. Exam Location:  [facility] Procedure: 2D Echo, Cardiac Doppler and Color Doppler Indications:     S/P CABG (coronary artery bypass graft) [Z95.1 (ICD-10-CM)]  History:         Patient has prior history of Echocardiogram examinations, most                  recent 03/22/2021. CAD, Signs/Symptoms:Edema; Risk                  Factors:Hypertension and Dyslipidemia.  Sonographer:     Luane School RDCS Referring Phys:  [id number] Kate Sable Diagnosing Phys: Ida Rogue MD  Sonographer Comments: Global longitudinal strain was attempted. IMPRESSIONS  1. Left ventricular ejection fraction, by estimation, is 55 to 60%. The left ventricle has normal function. The left ventricle has no regional wall motion abnormalities. There is moderate left ventricular hypertrophy. Left ventricular diastolic parameters are indeterminate.  2. Right ventricular systolic function is low normal. The right ventricular size is mildly enlarged. There is moderately elevated pulmonary artery systolic pressure. The estimated right ventricular systolic pressure is [id number] mmHg.  3. Left atrial size was severely dilated.  4. The mitral valve is normal in structure. Moderate to severe mitral valve regurgitation. No evidence of mitral stenosis. Moderate mitral annular calcification.  5. Tricuspid valve regurgitation is moderate.  6. The  aortic valve has an indeterminant number of cusps, unable to exclude bicuspid. There is severe calcifcation of the aortic valve. Aortic valve regurgitation is mild. Moderate aortic valve stenosis. Aortic valve area, by VTI measures 1.38 cm. Aortic valve mean gradient measures 15.0 mmHg. Aortic valve Vmax measures 2.58 m/s.  7. The inferior vena cava is normal in size with greater than 50% respiratory variability, suggesting right atrial pressure of 3 mmHg. FINDINGS  Left Ventricle: Left ventricular ejection fraction, by estimation, is 55 to 60%. The left ventricle has normal function. The left ventricle has no regional wall motion abnormalities. The left ventricular internal cavity size was normal in size. There is  moderate left ventricular hypertrophy. Left ventricular diastolic parameters are indeterminate. Right Ventricle: The right ventricular size is mildly enlarged. No increase in right ventricular wall  thickness. Right ventricular systolic function is low normal. There is moderately elevated pulmonary artery systolic pressure. The tricuspid regurgitant  velocity is 3.37 m/s, and with an assumed right atrial pressure of 3 mmHg, the estimated right ventricular systolic pressure is [id number] mmHg. Left Atrium: Left atrial size was severely dilated. Right Atrium: Right atrial size was normal in size. Pericardium: There is no evidence of pericardial effusion. Mitral Valve: The mitral valve is normal in structure. Moderate mitral annular calcification. Moderate to severe mitral valve regurgitation. No evidence of mitral valve stenosis. Tricuspid Valve: The tricuspid valve is normal in structure. Tricuspid valve regurgitation is moderate . No evidence of tricuspid stenosis. Aortic Valve: The aortic valve has an indeterminant number of cusps. There is severe calcifcation of the aortic valve. Aortic valve regurgitation is mild. Aortic regurgitation PHT measures 377 msec. Moderate aortic stenosis is present. Aortic valve  mean gradient measures 15.0 mmHg. Aortic valve peak gradient measures 26.7 mmHg. Aortic valve area, by VTI measures 1.38 cm. Pulmonic Valve: The pulmonic valve was normal in structure. Pulmonic valve regurgitation is not visualized. No evidence of pulmonic stenosis. Aorta: The aortic root is normal in size and structure. Venous: The inferior vena cava is normal in size with greater than 50% respiratory variability, suggesting right atrial pressure of 3 mmHg. IAS/Shunts: No atrial level shunt detected by color flow Doppler.  LEFT VENTRICLE PLAX 2D LVIDd:         5.10 cm     Diastology LVIDs:         4.50 cm     LV e' medial:    5.00 cm/s LV PW:         1.20 cm     LV E/e' medial:  27.8 LV IVS:        1.50 cm     LV e' lateral:   9.03 cm/s LVOT diam:     2.00 cm     LV E/e' lateral: 15.4 LV SV:         65 LV SV Index:   41 LVOT Area:     3.14 cm  LV Volumes (MOD) LV vol d, MOD A2C: 31.2 ml LV vol d, MOD A4C: 46.7 ml LV vol s, MOD A2C: 18.9 ml LV vol s, MOD A4C: 25.7 ml LV SV MOD A2C:     12.3 ml LV SV MOD A4C:     46.7 ml LV SV MOD BP:      15.9 ml RIGHT VENTRICLE            IVC RV S prime:     7.18 cm/s  IVC diam: 1.80 cm TAPSE (M-mode): 1.2 cm LEFT ATRIUM              Index        RIGHT ATRIUM           Index LA diam:        5.00 cm  3.18 cm/m   RA Area:     13.60 cm LA Vol (A2C):   93.3 ml  59.32 ml/m  RA Volume:   31.80 ml  20.22 ml/m LA Vol (A4C):   107.0 ml 68.03 ml/m LA Biplane Vol: 100.0 ml 63.58 ml/m  AORTIC VALVE AV Area (Vmax):    1.38 cm AV Area (Vmean):   1.37 cm AV Area (VTI):     1.38 cm AV Vmax:           258.25 cm/s AV Vmean:  178.000 cm/s AV VTI:            0.471 m AV Peak Grad:      26.7 mmHg AV Mean Grad:      15.0 mmHg LVOT Vmax:         113.50 cm/s LVOT Vmean:        77.550 cm/s LVOT VTI:          0.207 m LVOT/AV VTI ratio: 0.44 AI PHT:            377 msec  AORTA Ao Root diam: 2.90 cm Ao Asc diam:  3.60 cm Ao Desc diam: 2.00 cm MITRAL VALVE                TRICUSPID VALVE MV  Area (PHT): 5.02 cm     TR Peak grad:   45.4 mmHg MV Decel Time: 151 msec     TR Vmax:        337.00 cm/s MV E velocity: 139.00 cm/s MV A velocity: 122.00 cm/s  SHUNTS MV E/A ratio:  1.14         Systemic VTI:  0.21 m                             Systemic Diam: 2.00 cm Ida Rogue MD Electronically signed by Ida Rogue MD Signature Date/Time: 11/01/2022/6:11:09 PM    Final (Updated)     Microbiology: Recent Results (from the past 240 hour(s))  Culture, blood (Routine x 2)     Status: Abnormal   Collection Time: 11/04/22  1:27 PM   Specimen: BLOOD  Result Value Ref Range Status   Specimen Description   Final    BLOOD RIGHT ANTECUBITAL Performed at [facility], [address] [place], [place] [address]    Special Requests   Final    BOTTLES DRAWN AEROBIC AND ANAEROBIC Blood Culture adequate volume Performed at [facility], [address] [place], [place] [address]    Culture  Setup Time   Final    Organism ID to follow [place] CRITICAL RESULT CALLED TO, READ BACK BY AND VERIFIED WITH: C/CAROLYN COULTER 11/05/22 1142 SLM Performed at [facility], [address] [place], [place] [address]    Culture (A)  Final    BACTEROIDES FRAGILIS BETA LACTAMASE POSITIVE Performed at [facility], [place] [address] [place], [place] [address]    Report Status 11/07/2022 FINAL  Final  Culture, blood (Routine x 2)     Status: None (Preliminary result)   Collection Time: 11/04/22  1:27 PM   Specimen: BLOOD  Result Value Ref Range Status   Specimen Description   Final    BLOOD LEFT ANTECUBITAL Performed at [facility], [address], [place], [place] [address]    Special Requests   Final    BOTTLES DRAWN AEROBIC AND ANAEROBIC Blood Culture adequate volume Performed at [facility], [address] [place], [place] [address]    Culture   Final    NO GROWTH 3 DAYS Performed at [facility], [place] [address] [place], [place] [address]    Report Status PENDING  Incomplete  Blood Culture ID Panel (Reflexed)     Status: Abnormal   Collection Time: 11/04/22  1:27 PM  Result Value Ref Range Status   Enterococcus faecalis NOT DETECTED NOT DETECTED Final   Enterococcus Faecium NOT DETECTED NOT DETECTED  Final   Listeria monocytogenes NOT DETECTED NOT DETECTED Final   Staphylococcus species NOT DETECTED NOT DETECTED Final   Staphylococcus aureus (BCID) NOT DETECTED NOT DETECTED Final   Staphylococcus epidermidis NOT DETECTED NOT DETECTED Final   Staphylococcus lugdunensis NOT DETECTED NOT DETECTED Final   Streptococcus species NOT DETECTED NOT DETECTED Final   Streptococcus agalactiae NOT DETECTED NOT DETECTED Final   Streptococcus pneumoniae NOT DETECTED NOT DETECTED Final   Streptococcus pyogenes NOT DETECTED NOT DETECTED Final   A.calcoaceticus-baumannii NOT DETECTED NOT DETECTED Final   Bacteroides fragilis DETECTED (A) NOT DETECTED Final    Comment: CRITICAL RESULT CALLED TO, READ BACK BY AND VERIFIED WITH: C/CAROLYN COULTER 11/05/22 1142 SLM    Enterobacterales NOT DETECTED NOT DETECTED Final   Enterobacter cloacae complex NOT DETECTED NOT DETECTED Final   Escherichia coli NOT DETECTED NOT DETECTED Final   Klebsiella aerogenes NOT DETECTED NOT DETECTED Final   Klebsiella oxytoca NOT DETECTED NOT DETECTED Final   Klebsiella pneumoniae NOT DETECTED NOT DETECTED Final   Proteus species NOT DETECTED NOT DETECTED Final   Salmonella species NOT DETECTED NOT DETECTED Final   Serratia marcescens NOT DETECTED NOT DETECTED Final   Haemophilus influenzae NOT DETECTED NOT DETECTED Final   Neisseria meningitidis NOT DETECTED NOT DETECTED Final   Pseudomonas aeruginosa NOT DETECTED NOT DETECTED Final   Stenotrophomonas maltophilia NOT DETECTED NOT DETECTED Final   Candida albicans NOT DETECTED NOT DETECTED Final   Candida auris NOT DETECTED NOT DETECTED Final   Candida glabrata NOT  DETECTED NOT DETECTED Final   Candida krusei NOT DETECTED NOT DETECTED Final   Candida parapsilosis NOT DETECTED NOT DETECTED Final   Candida tropicalis NOT DETECTED NOT DETECTED Final   Cryptococcus neoformans/gattii NOT DETECTED NOT DETECTED Final    Comment: Performed at [facility], [address] [place], [place] [address]  Aerobic/Anaerobic Culture w Gram Stain (surgical/deep wound)     Status: None (Preliminary result)   Collection Time: 11/05/22  1:20 PM   Specimen: Leg  Result Value Ref Range Status   Specimen Description   Final    LEG Performed at [facility], [address] [place], [place] [address]    Special Requests   Final    NONE Performed at [facility], [address] [place], [place] [address]    Gram Stain   Final    RARE GRAM NEGATIVE RODS NO WBC SEEN Performed at [facility], [address], [place], [place] [address]    Culture   Final    RARE GRAM NEGATIVE RODS IDENTIFICATION AND SUSCEPTIBILITIES TO FOLLOW NO ANAEROBES ISOLATED; CULTURE IN PROGRESS FOR 5 DAYS    Report Status PENDING  Incomplete     Labs: Basic Metabolic Panel: Recent Labs  Lab 11/04/22 1324 11/05/22 0429 11/06/22 0428  NA 140 141 142  K 5.4* 4.2 5.4*  CL 103 112* 108  CO2 24 23 21*  GLUCOSE 113* 77 79  BUN 59* 57* 62*  CREATININE 2.54* 2.19* 2.92*  CALCIUM 8.5* 7.9* 7.9*   Liver Function Tests: Recent Labs  Lab 11/04/22 1324 11/05/22 0429  AST 31 16  ALT 9 7  ALKPHOS 87 69  BILITOT 0.9 0.7  PROT 6.7 5.3*  ALBUMIN 2.5* 2.0*   No results for input(s): "LIPASE", "AMYLASE" in the last 168 hours. No results for input(s): "AMMONIA" in the last 168 hours. CBC: Recent Labs  Lab 11/04/22 1324 11/05/22 0429 11/06/22 0428  WBC 15.6* 17.9* 24.1*  NEUTROABS 13.1*  --   --   HGB 9.2* 8.0* 7.5*  HCT 30.4* 25.7* 24.5*  MCV 97.7 96.3 97.6  PLT 262 256 280   Cardiac Enzymes: No results for input(s): "CKTOTAL", "CKMB",  "CKMBINDEX", "TROPONINI" in the last 168 hours. BNP: BNP (last 3 results) No results for input(s): "BNP" in the last 8760 hours.  ProBNP (last 3 results) No results for input(s): "PROBNP" in the last 8760 hours.  CBG: No results for input(s): "GLUCAP" in the last 168 hours.     Signed:  Desma Maxim MD.  [facility] 11/07/2022, 4:18 PM

## 2022-11-07 NOTE — TOC Initial Note (Signed)
Transition of Care [facility]) - Initial/Assessment Note    Patient Details  Name: Hayley Maynard MRN: [id number] Date of Birth: 06-Dec-1921  Transition of [facility]) CM/SW Contact:    Hayley Sessions, RN Phone Number: 11/07/2022, 3:25 PM  Clinical Narrative:                  MD made referral to [facility] for residential hospice   I called and confirmed with daughter Hayley Maynard that she prefers [facility].  Hayley Maynard with Manufacturing engineer notified        Patient Goals and CMS Choice            Expected Discharge Plan and Services                                              Prior Living Arrangements/Services                       Activities of Daily Living      Permission Sought/Granted                  Emotional Assessment              Admission diagnosis:  Cellulitis [L03.90] Chronic atrial fibrillation ([place]) [I48.20] Cellulitis of right leg [id number] Sepsis without acute organ dysfunction, due to unspecified organism [facility]) [A41.9] Patient Active Problem List   Diagnosis Date Noted   Abscess of lower leg 11/05/2022   Pressure injury of skin 11/05/2022   Cellulitis 11/04/2022   Sepsis ([place]) 11/04/2022   Hyperkalemia 09/01/2022   Essential hypertension 09/01/2022   Dyslipidemia 09/01/2022   Acute kidney injury superimposed on chronic kidney disease ([place]) 08/31/2022   Atherosclerosis of native arteries of extremity with intermittent claudication ([place]) 12/06/2021   Acute CVA (cerebrovascular accident) ([place]) 03/21/2021   Acute cholecystitis 03/02/2021   Chronic venous insufficiency 11/26/2020   Lymphedema 11/26/2020   Pain and swelling of lower leg 11/26/2020   Hammer toes, bilateral 11/09/2020   Displaced fracture of greater trochanter of right femur, initial encounter for closed fracture ([place]) 03/20/2020   CAP (community acquired pneumonia) 03/20/2020   Bilateral carotid artery stenosis 02/19/2020    Chronic kidney disease, stage III (moderate) ([place]) 10/28/2019   Anemia 11/08/2018   CAD (coronary artery disease) 11/08/2018   Mixed hyperlipidemia 11/08/2018   Moderate mitral insufficiency 03/30/2017   CKD (chronic kidney disease) stage 3, GFR 30-59 ml/min 01/27/2015   LVH (left ventricular hypertrophy) due to hypertensive disease, without heart failure 01/27/2015   Benign essential hypertension 01/09/2015   PCP:  Hayley Pink, MD Pharmacy:   [facility] [place], [address] [place] [address] [place] [address] Phone: [phone] Fax: [phone]     Social Determinants of Health (SDOH) Social History: SDOH Screenings   Tobacco Use: Medium Risk (11/04/2022)   SDOH Interventions:     Readmission Risk Interventions     No data to display

## 2022-11-07 NOTE — Progress Notes (Signed)
. Progress Note   Patient: Hayley Maynard [id number] DOB: May 07, 1922 DOA: 11/04/2022     3 DOS: the patient was seen and examined on 11/07/2022   Brief hospital course:  Hayley Maynard is a 89 y.o. female with medical history significant of Stage III chronic kidney disease, CAD, hypertension, dyslipidemia and osteoarthritis presenting w/ RLE cellulitis, leukocytosis.  History primarily from patient's daughter Hayley Maynard.  Per report, patient has been having worsening lymphedema predominate in the right leg.  Area has been getting wrapped by home health nursing for several weeks.  However swelling redness have only gotten worse.  No fevers or chills.  No nausea or vomiting.  Denies any actual trauma to the area.  Patient currently lives at home with her daughter with home health nursing coming in regularly.  No recent changes in medication.  Noticed worsening weeping.  Per the daughter, she took her mother to be evaluated at her PCPs office who recommended ER evaluation.  No significant medication changes noted. Presents to the ER afebrile, heart rate into the 120s, BP stable.  Satting well on room air.  Labs normal for white count of 15.6, hemoglobin 9.2 creatinine 2.54 with baseline creatinine around 2, potassium of 5.4, lactate of 2.4.  Right tib-fib grossly stable.  Chest x-ray with increasing left pleural effusion and left lower lung consolidation/atelectasis.  3/16 : Leukocytosis worsening 15.6-17.9, MRI showed Subcutaneous gas collection on the medial aspect of the proximal calf measuring 0.8 x 1.8 x 6.4 cm consistent with abscess.Marked subcutaneous soft tissue edema suggesting severe cellulitis.Mild intramuscular edema of the proximal medial and lateral heads of the gastrocnemius muscles suggesting myositis.Edema along the peroneal tendons suggesting tenosynovitis.  Surgery consultants recommendations pending  3/17 : Post I&D patient has been more somnolent with confusion.  Wakes up and agitation.   Discussed about palliative measures as patient's has extremely poor prognosis secondary to ongoing tissue pathology.  Daughter agreeable but patient prior to presentation had capacity was AOx3 able to make decision.  Has been altered and confused since I&D.  If patient continues to stay in this state with poor prognosis has advanced directive of not continuing aggressive medical management if outcome is futile.  Will wait another 24 hours prior to starting comfort care and discussion with daughter.  Daughter agreeable to comfort measures.  Palliative eval in the morning.  3/18: not eating or drinking, remains encephalopathic, worsening kidney function and leukocytosis noted, bacteroides growing in one blood culture. Discussed course and prognosis at length with daughter, shared that at this point unlikely to make a meaningful recover, daughter agrees, transitioning to full comfort care, consult to hospice  Assessment and Plan: * Cellulitis Bacteremia + RLE cellulitis on presentation  WBC 15.6 -> 25 Treated with IV cefepime and vancomycin for infectious coverage; flagyl added for one blood culture growing bacteroides S/p I and D right lower extremity with general surgery\  Endephalopathy 2/2 underlying infection, renal dysfunction, narcotics  Hyperkalemia 2/2 kidney dysfunction, treated with lokelma  Acute kidney injury superimposed on chronic kidney disease stage 4 ([place]) Worsening kidney function here, most recent cr ~3, with hyperkalemia as above. Treated with IV fluids  Anemia Hgb 9 on presentation trending down to 7s, no bleeding noted  Lymphedema Chronic lymphedema w/ superimposed acute cellulitis on distal RLE   S/p course of compression dressings outpt   Sepsis ([place]) Meet sepsis criteria based on heart rate in 100s as well as white blood cell count 15.6 Noted likely soft tissue source with  right lower extremity cellulitis Some?  Atelectasis versus pneumonia on chest x-ray though  with no hypoxia present Afebrile Pancultured in the ER Started on cefepime, Flagyl and vancomycin in the ER Inpatient treated with cefepime, azithromycin and vancomycin for soft tissue and respiratory coverage   Essential hypertension       Subjective: Sleeping  Physical Exam: Vitals:   11/06/22 1618 11/06/22 1952 11/07/22 0408 11/07/22 0756  BP: (!) 84/64 115/76 134/80 126/78  Pulse: 98 85 99 87  Resp: 20 18 (!) 22 18  Temp: 98.2 F (36.8 C) 97.9 F (36.6 C) (!) 97.4 F (36.3 C) 97.7 F (36.5 C)  TempSrc:  Oral Oral   SpO2: 91% (!) 89% 91% 90%  Weight:      Height:       Physical Exam Constitutional:      Appearance: She is ill-appearing. She is not diaphoretic.  HENT:     Head: Normocephalic and atraumatic.     Mouth/Throat:     Mouth: Mucous membranes are dry.  Eyes:     Extraocular Movements: Extraocular movements intact.     Pupils: Pupils are equal, round, and reactive to light.  Cardiovascular:     Rate and Rhythm: Normal rate and regular rhythm.     Heart sounds: No murmur heard. Pulmonary:     Effort: Pulmonary effort is normal.  Abdominal:     General: Abdomen is flat.     Palpations: Abdomen is soft.  Musculoskeletal:     Cervical back: Normal range of motion.     Comments: Bandage over b/l lower extremities  Skin:    General: Skin is warm.  Neurological:     General: No focal deficit present.     Mental Status: She is alert.  Psychiatric:        Attention and Perception: She is attentive.        Speech: Speech normal.        Behavior: Behavior is slowed.     Data Reviewed:  Results are pending, will review when available.  Family Communication: daughter updated @ bedside  Disposition: Status is: Inpatient Remains inpatient appropriate because: unsafe d/c plan  Planned Discharge Destination: Home    Time spent: 45 minutes  Author: Desma Maxim, MD 11/07/2022 12:35 PM  For on call review [email or web address].

## 2022-11-07 NOTE — Progress Notes (Signed)
Manufacturing engineer [facility]) InPatient Unit [facility]) Referral Note  New referral for IPU received from Elpidio Galea, RN, [facility] for [facility] evaluation.  I met with patient's daughter, Jeryl [place], patient's daughter and POA to discuss hospice philosophy, comfort care and Hospice Home services.  She is in agreement with and would like to proceed to see if Ms. Primo qualifies for [facility].  Per Dr. Allie Dimmer Monguilod, hospice physician, Ms. Rowin has been approved for the [facility] for symptom management of pain and agitation and end of life care.  Plan for Ms. Sartorius to transfer to the [place] today.  This RN will call and set up EMS transport after patient's daughter completes signing consents at the [facility] and the hospital is ready for transport.  Please ensure portable DNR accompanies Ms. Wiedrich to the [facility].    RN to call report to the [facility] nurse at [phone].  Will continue to follow through disposition.  Thank you for allowing participation in this patient's care.  Dimas Aguas, RN Nurse Liaison [phone]

## 2022-11-09 LAB — CULTURE, BLOOD (ROUTINE X 2)
Culture: NO GROWTH
Special Requests: ADEQUATE

## 2022-11-12 LAB — AEROBIC/ANAEROBIC CULTURE W GRAM STAIN (SURGICAL/DEEP WOUND)

## 2022-11-21 DEATH — deceased

## 2022-12-05 ENCOUNTER — Encounter (INDEPENDENT_AMBULATORY_CARE_PROVIDER_SITE_OTHER): Payer: Medicare Other

## 2022-12-05 ENCOUNTER — Ambulatory Visit (INDEPENDENT_AMBULATORY_CARE_PROVIDER_SITE_OTHER): Payer: Medicare Other | Admitting: Vascular Surgery
